# Patient Record
Sex: Female | Born: 1985 | Race: Black or African American | Hispanic: No | Marital: Married | Smoking: Never smoker
Health system: Southern US, Community
[De-identification: ages and names within clinical notes are randomized; demographics above are authoritative.]

## PROBLEM LIST (undated history)

## (undated) DIAGNOSIS — B379 Candidiasis, unspecified: Secondary | ICD-10-CM

## (undated) DIAGNOSIS — E669 Obesity, unspecified: Secondary | ICD-10-CM

## (undated) DIAGNOSIS — N898 Other specified noninflammatory disorders of vagina: Secondary | ICD-10-CM

## (undated) DIAGNOSIS — B9689 Other specified bacterial agents as the cause of diseases classified elsewhere: Secondary | ICD-10-CM

## (undated) DIAGNOSIS — K219 Gastro-esophageal reflux disease without esophagitis: Secondary | ICD-10-CM

## (undated) DIAGNOSIS — N760 Acute vaginitis: Secondary | ICD-10-CM

## (undated) HISTORY — DX: Other specified noninflammatory disorders of vagina: N89.8

---

## 2011-10-10 HISTORY — PX: LEEP: SHX91

## 2014-08-11 ENCOUNTER — Encounter (HOSPITAL_COMMUNITY): Payer: Self-pay | Admitting: Emergency Medicine

## 2014-08-11 ENCOUNTER — Emergency Department (INDEPENDENT_AMBULATORY_CARE_PROVIDER_SITE_OTHER)
Admission: EM | Admit: 2014-08-11 | Discharge: 2014-08-11 | Disposition: A | Discharge status: Home / Self Care | Payer: Medicaid - Out of State | Source: Home / Self Care | Attending: Emergency Medicine | Admitting: Emergency Medicine

## 2014-08-11 ENCOUNTER — Other Ambulatory Visit (HOSPITAL_COMMUNITY)
Admission: RE | Admit: 2014-08-11 | Discharge: 2014-08-11 | Disposition: A | Discharge status: Home / Self Care | Payer: Medicaid - Out of State | Source: Ambulatory Visit | Attending: Emergency Medicine | Admitting: Emergency Medicine

## 2014-08-11 DIAGNOSIS — Z113 Encounter for screening for infections with a predominantly sexual mode of transmission: Secondary | ICD-10-CM | POA: Insufficient documentation

## 2014-08-11 DIAGNOSIS — N390 Urinary tract infection, site not specified: Secondary | ICD-10-CM

## 2014-08-11 DIAGNOSIS — N760 Acute vaginitis: Secondary | ICD-10-CM | POA: Insufficient documentation

## 2014-08-11 LAB — POCT URINALYSIS DIP (DEVICE)
Bilirubin Urine: NEGATIVE
Glucose, UA: NEGATIVE mg/dL
Hgb urine dipstick: NEGATIVE
Ketones, ur: NEGATIVE mg/dL
LEUKOCYTES UA: NEGATIVE
Nitrite: NEGATIVE
PH: 5.5 (ref 5.0–8.0)
PROTEIN: NEGATIVE mg/dL
Specific Gravity, Urine: 1.025 (ref 1.005–1.030)
Urobilinogen, UA: 0.2 mg/dL (ref 0.0–1.0)

## 2014-08-11 LAB — CBC WITH DIFFERENTIAL/PLATELET
BASOS PCT: 0 % (ref 0–1)
Basophils Absolute: 0 10*3/uL (ref 0.0–0.1)
Eosinophils Absolute: 0.1 10*3/uL (ref 0.0–0.7)
Eosinophils Relative: 1 % (ref 0–5)
HCT: 37.2 % (ref 36.0–46.0)
Hemoglobin: 12.5 g/dL (ref 12.0–15.0)
Lymphocytes Relative: 46 % (ref 12–46)
Lymphs Abs: 3.4 10*3/uL (ref 0.7–4.0)
MCH: 29.2 pg (ref 26.0–34.0)
MCHC: 33.6 g/dL (ref 30.0–36.0)
MCV: 86.9 fL (ref 78.0–100.0)
Monocytes Absolute: 0.4 10*3/uL (ref 0.1–1.0)
Monocytes Relative: 6 % (ref 3–12)
NEUTROS PCT: 47 % (ref 43–77)
Neutro Abs: 3.5 10*3/uL (ref 1.7–7.7)
PLATELETS: 239 10*3/uL (ref 150–400)
RBC: 4.28 MIL/uL (ref 3.87–5.11)
RDW: 13.3 % (ref 11.5–15.5)
WBC: 7.4 10*3/uL (ref 4.0–10.5)

## 2014-08-11 LAB — POCT PREGNANCY, URINE: Preg Test, Ur: NEGATIVE

## 2014-08-11 MED ORDER — HYDROCODONE-ACETAMINOPHEN 5-325 MG PO TABS
ORAL_TABLET | ORAL | Status: AC
  Filled 2014-08-11: qty 2

## 2014-08-11 MED ORDER — OXYCODONE-ACETAMINOPHEN 5-325 MG PO TABS: ORAL_TABLET | ORAL | Status: DC

## 2014-08-11 MED ORDER — CEPHALEXIN 500 MG PO CAPS: 500.0000 mg | ORAL_CAPSULE | Freq: Three times a day (TID) | ORAL | Status: DC

## 2014-08-11 MED ORDER — HYDROCODONE-ACETAMINOPHEN 5-325 MG PO TABS
2.0000 | ORAL_TABLET | Freq: Once | ORAL | Status: AC
  Administered 2014-08-11: 2 via ORAL

## 2014-08-11 NOTE — ED Provider Notes (Signed)
Chief Complaint   Abdominal Pain   History of Present Illness   Nancy Young is Nancy Young 28 year old female who has a history since last night of bilateral pelvic pain without radiation which is constant and rated 9/10 in intensity. This is accompanied by urinary frequency, urgency, and dysuria. The pain is also worse with a bowel movement the patient denies any blood in the urine. She's had no fever or chills. She states she hasn't had much of an appetite. No nausea or vomiting. No constipation, diarrhea, or blood in the stool. The pain is worse on the right than the left. The patient's last menstrual period is October 20. She is sexually active but in a same sex relationship. She does not use birth control. She denies any history of ovarian cysts or fibroid tumors.  Review of Systems   Other than as noted above, the patient denies any of the following symptoms: Constitutional:  No fever, chills, weight loss or anorexia. Abdomen:  No nausea, vomiting, hematememesis, melena, diarrhea, or hematochezia. GU:  No dysuria, frequency, urgency, or hematuria. Gyn:  No vaginal discharge, itching, abnormal bleeding, dyspareunia, or pelvic pain.  PMFSH   Past medical history, family history, social history, meds, and allergies were reviewed.   Physical Exam     Vital signs:  BP 117/75 mmHg  Pulse 69  Temp(Src) 98.2 F (36.8 C) (Oral)  Resp 12  SpO2 97%  LMP 08/04/2014 Gen:  Alert, oriented, in no distress. Lungs:  Breath sounds clear and equal bilaterally.  No wheezes, rales or rhonchi. Heart:  Regular rhythm.  No gallops or murmers.   Abdomen:  Soft, flat, and nondistended. No organomegaly or mass. Bowel sounds were normally active. There is tenderness to palpation across the entire lower abdomen without guarding or rebound. Pelvic:  Normal external genitalia, vaginal and cervical mucosa were normal. There was no blood or discharge in the vaginal vault. There was pain on cervical motion.  Uterus was normal in size and shape and moderately tender on palpation. She has moderate bilateral adnexal tenderness without mass.  DNA probes for gonorrhea, Chlamydia, Trichomonas, Gardnerella, and Candida were obtained. Skin:  Clear, warm and dry.  No rash.  Labs   Results for orders placed or performed during the hospital encounter of 08/11/14  CBC with Differential  Result Value Ref Range   WBC 7.4 4.0 - 10.5 K/uL   RBC 4.28 3.87 - 5.11 MIL/uL   Hemoglobin 12.5 12.0 - 15.0 g/dL   HCT 40.937.2 81.136.0 - 91.446.0 %   MCV 86.9 78.0 - 100.0 fL   MCH 29.2 26.0 - 34.0 pg   MCHC 33.6 30.0 - 36.0 g/dL   RDW 78.213.3 95.611.5 - 21.315.5 %   Platelets 239 150 - 400 K/uL   Neutrophils Relative % 47 43 - 77 %   Neutro Abs 3.5 1.7 - 7.7 K/uL   Lymphocytes Relative 46 12 - 46 %   Lymphs Abs 3.4 0.7 - 4.0 K/uL   Monocytes Relative 6 3 - 12 %   Monocytes Absolute 0.4 0.1 - 1.0 K/uL   Eosinophils Relative 1 0 - 5 %   Eosinophils Absolute 0.1 0.0 - 0.7 K/uL   Basophils Relative 0 0 - 1 %   Basophils Absolute 0.0 0.0 - 0.1 K/uL  POCT urinalysis dip (device)  Result Value Ref Range   Glucose, UA NEGATIVE NEGATIVE mg/dL   Bilirubin Urine NEGATIVE NEGATIVE   Ketones, ur NEGATIVE NEGATIVE mg/dL   Specific Gravity, Urine 1.025  1.005 - 1.030   Hgb urine dipstick NEGATIVE NEGATIVE   pH 5.5 5.0 - 8.0   Protein, ur NEGATIVE NEGATIVE mg/dL   Urobilinogen, UA 0.2 0.0 - 1.0 mg/dL   Nitrite NEGATIVE NEGATIVE   Leukocytes, UA NEGATIVE NEGATIVE  Pregnancy, urine POC  Result Value Ref Range   Preg Test, Ur NEGATIVE NEGATIVE   The urine was cultured.  Course in Urgent Care Center   The following medications were given:  Medications  HYDROcodone-acetaminophen (NORCO/VICODIN) 5-325 MG per tablet 2 tablet (2 tablets Oral Given 08/11/14 1752)   Assessment   The encounter diagnosis was UTI (lower urinary tract infection).  Differential diagnosis includes ovarian cyst or ovarian torsion. Ectopic pregnancy or PID is  unlikely since she is in a same sex relationship. With prominent urinary symptoms UTI is still the most likely diagnosis. Diverticulitis and appendicitis are unlikely, given her normal CBC.  Plan     1.  Meds:  The following meds were prescribed:   Discharge Medication List as of 08/11/2014  6:44 PM    START taking these medications   Details  cephALEXin (KEFLEX) 500 MG capsule Take 1 capsule (500 mg total) by mouth 3 (three) times daily., Starting 08/11/2014, Until Discontinued, Normal    oxyCODONE-acetaminophen (PERCOCET) 5-325 MG per tablet 1 to 2 tablets every 6 hours as needed for pain., Print        2.  Patient Education/Counseling:  The patient was given appropriate handouts, self care instructions, and instructed in symptomatic relief.    3.  Follow up:  The patient was told to follow up here if no better in 2 days, or sooner if becoming worse in any way, and given some red flag symptoms such as worsening pain, fever, vomiting, or evidence of GI bleeding which would prompt immediate return.     Reuben Likesavid C Olga Bourbeau, MD 08/11/14 [phone]

## 2014-08-11 NOTE — Discharge Instructions (Signed)

## 2014-08-12 LAB — URINE CULTURE
COLONY COUNT: NO GROWTH
Culture: NO GROWTH
Special Requests: NORMAL

## 2014-08-12 LAB — CERVICOVAGINAL ANCILLARY ONLY
CHLAMYDIA, DNA PROBE: NEGATIVE
NEISSERIA GONORRHEA: NEGATIVE
Wet Prep (BD Affirm): NEGATIVE
Wet Prep (BD Affirm): NEGATIVE
Wet Prep (BD Affirm): POSITIVE — AB

## 2014-08-14 ENCOUNTER — Telehealth (HOSPITAL_COMMUNITY): Payer: Self-pay | Admitting: Emergency Medicine

## 2014-08-14 ENCOUNTER — Telehealth (HOSPITAL_COMMUNITY): Payer: Self-pay | Admitting: *Deleted

## 2014-08-14 MED ORDER — METRONIDAZOLE 500 MG PO TABS: 500.0000 mg | ORAL_TABLET | Freq: Two times a day (BID) | ORAL | Status: DC

## 2014-08-14 NOTE — ED Notes (Addendum)
Dr. Lorenz CoasterKeller e-prescribed Flagyl to pt.'s pharmacy for bacterial vaginosis.  I called pt. and left a message for her she had a Rx. at her pharmacy and to call back on Mon. for her results. [name], Nancy Young 08/14/2014 Pt. called back.  Pt. verified x 2 and given results.  Pt. told she had a Rx. of Flagyl at [facility] on [address]. [facility] [address]  I told her she can get it transferred there.  Pt. instructed to no alcohol while taking this medication.  Pt. asked if she needs to finish the keflex. I told her the Urine culture was neg. If no symptoms she does not need to finish it. 08/17/2014

## 2014-08-14 NOTE — Telephone Encounter (Signed)
-----   Message from Vassie MoselleSuzanne M York, RN sent at 08/14/2014  7:01 PM EST ----- Regarding: lab Gardnerella pos. Did you want to treat this? I thought I sent this yesterday but could not find it. [name], Suzanne M 08/14/2014

## 2014-08-14 NOTE — ED Notes (Signed)
The patient's DNA probe came back positive for Gardnerella. She will need metronidazole 500 mg, #14, 1 twice a day for one week. This will be sent to her pharmacy. We will need to call her and let her know these results.   Reuben Likesavid C Christop Hippert, MD 08/14/14 2039

## 2014-08-18 NOTE — ED Notes (Signed)
Pt     Called  Back        And            Had  Already           Had  Her   questians   Answered

## 2014-08-23 ENCOUNTER — Encounter (HOSPITAL_COMMUNITY): Payer: Self-pay | Admitting: *Deleted

## 2014-08-23 ENCOUNTER — Emergency Department (INDEPENDENT_AMBULATORY_CARE_PROVIDER_SITE_OTHER)
Admission: EM | Admit: 2014-08-23 | Discharge: 2014-08-23 | Disposition: A | Discharge status: Home / Self Care | Payer: Medicaid - Out of State | Source: Home / Self Care | Attending: Emergency Medicine | Admitting: Emergency Medicine

## 2014-08-23 DIAGNOSIS — J069 Acute upper respiratory infection, unspecified: Secondary | ICD-10-CM

## 2014-08-23 DIAGNOSIS — B9789 Other viral agents as the cause of diseases classified elsewhere: Principal | ICD-10-CM

## 2014-08-23 MED ORDER — IPRATROPIUM BROMIDE 0.06 % NA SOLN: 2.0000 | Freq: Four times a day (QID) | NASAL | Status: DC

## 2014-08-23 MED ORDER — CETIRIZINE HCL 10 MG PO TABS: 10.0000 mg | ORAL_TABLET | Freq: Every day | ORAL | Status: DC

## 2014-08-23 NOTE — Discharge Instructions (Signed)
You have a virus that is causing a lot of congestion which is causing your ear pain. Use the Atrovent nasal spray 4 times a day. Take Zyrtec 1 pill daily for the next 2 weeks. Get nasal saline spray at the drug store and use it at least 3 times a day. You can also use over-the-counter Afrin nasal spray. Do not use this more than 3 days. Rest your voice as much as you can. You should start to feel better by Wednesday. If your symptoms worsen or change please come back.

## 2014-08-23 NOTE — ED Provider Notes (Signed)
CSN: [id number]     Arrival date & time 08/23/14  1030 History   First MD Initiated Contact with Patient 08/23/14 1040     Chief Complaint  Patient presents with  . Otalgia   (Consider location/radiation/quality/duration/timing/severity/associated sxs/prior Treatment) HPI  She is a 28 year old woman here for evaluation of ear aches. Her symptoms started about one week ago with nasal congestion, rhinorrhea, sore throat, cough. Starting about 2 days ago, she developed pain in her ears, right worse than left. She also started losing her voice.  She denies any ear drainage. No fevers or chills. No nausea or vomiting. She denies any headaches or sinus pressure.  History reviewed. No pertinent past medical history. History reviewed. No pertinent past surgical history. Family History  Problem Relation Age of Onset  . Diabetes Mother    History  Substance Use Topics  . Smoking status: Never Smoker   . Smokeless tobacco: Not on file  . Alcohol Use: Yes     Comment: occasional   OB History    No data available     Review of Systems  Constitutional: Negative for fever.  HENT: Positive for congestion, ear pain, rhinorrhea and sore throat. Negative for ear discharge and sinus pressure.   Respiratory: Positive for cough. Negative for shortness of breath.   Gastrointestinal: Negative for nausea, vomiting, abdominal pain and diarrhea.  Musculoskeletal: Negative for myalgias.  Neurological: Negative for headaches.    Allergies  Review of patient's allergies indicates no known allergies.  Home Medications   Prior to Admission medications   Medication Sig Start Date End Date Taking? Authorizing Provider  cephALEXin (KEFLEX) 500 MG capsule Take 1 capsule (500 mg total) by mouth 3 (three) times daily. 08/11/14   Reuben Likesavid C Keller, MD  cetirizine (ZYRTEC) 10 MG tablet Take 1 tablet (10 mg total) by mouth daily. 08/23/14   Charm RingsErin J Aminah Zabawa, MD  ipratropium (ATROVENT) 0.06 % nasal spray Place 2 sprays  into both nostrils 4 (four) times daily. 08/23/14   Charm RingsErin J Gabriell Daigneault, MD  metroNIDAZOLE (FLAGYL) 500 MG tablet Take 1 tablet (500 mg total) by mouth 2 (two) times daily. 08/14/14   Reuben Likesavid C Keller, MD  oxyCODONE-acetaminophen (PERCOCET) 5-325 MG per tablet 1 to 2 tablets every 6 hours as needed for pain. 08/11/14   Reuben Likesavid C Keller, MD   BP 118/86 mmHg  Pulse 73  Temp(Src) 98.6 F (37 C) (Oral)  Resp 17  SpO2 99%  LMP 08/04/2014 Physical Exam  Constitutional: She is oriented to person, place, and time. She appears well-developed and well-nourished. No distress.  HENT:  Head: Normocephalic and atraumatic.  Right Ear: External ear normal. Tympanic membrane is retracted.  Left Ear: External ear normal. Tympanic membrane is retracted.  Nose: Mucosal edema and rhinorrhea present. Right sinus exhibits no maxillary sinus tenderness and no frontal sinus tenderness. Left sinus exhibits no maxillary sinus tenderness and no frontal sinus tenderness.  Mouth/Throat: Oropharynx is clear and moist. No oropharyngeal exudate.  Neck: Neck supple.  Cardiovascular: Normal rate, regular rhythm and normal heart sounds.   No murmur heard. Pulmonary/Chest: Effort normal and breath sounds normal. No respiratory distress. She has no wheezes. She has no rales.  Lymphadenopathy:    She has no cervical adenopathy.  Neurological: She is alert and oriented to person, place, and time.    ED Course  Procedures (including critical care time) Labs Review Labs Reviewed - No data to display  Imaging Review No results found.   MDM  1. Viral URI with cough    Viral URI with congestion leading to ear pain. We'll treat symptomatically with Atrovent nasal spray and Zyrtec. Also recommended nasal saline spray and Afrin. Follow-up as needed.    Charm RingsErin J Niyana Chesbro, MD 08/23/14 [phone]

## 2014-08-23 NOTE — ED Notes (Signed)
C/o bil. earache R> L.  onset Monday.  C/o congestion in upper airways and losing her voice.

## 2014-11-24 ENCOUNTER — Encounter (HOSPITAL_COMMUNITY): Payer: Self-pay

## 2014-11-24 ENCOUNTER — Emergency Department (HOSPITAL_COMMUNITY)
Admission: EM | Admit: 2014-11-24 | Discharge: 2014-11-24 | Disposition: A | Discharge status: Home / Self Care | Payer: Medicaid - Out of State | Attending: Emergency Medicine | Admitting: Emergency Medicine

## 2014-11-24 DIAGNOSIS — Z792 Long term (current) use of antibiotics: Secondary | ICD-10-CM | POA: Insufficient documentation

## 2014-11-24 DIAGNOSIS — Z79899 Other long term (current) drug therapy: Secondary | ICD-10-CM | POA: Insufficient documentation

## 2014-11-24 DIAGNOSIS — R519 Headache, unspecified: Secondary | ICD-10-CM

## 2014-11-24 DIAGNOSIS — Y92410 Unspecified street and highway as the place of occurrence of the external cause: Secondary | ICD-10-CM | POA: Insufficient documentation

## 2014-11-24 DIAGNOSIS — S8011XA Contusion of right lower leg, initial encounter: Secondary | ICD-10-CM | POA: Insufficient documentation

## 2014-11-24 DIAGNOSIS — R51 Headache: Secondary | ICD-10-CM

## 2014-11-24 DIAGNOSIS — S0990XA Unspecified injury of head, initial encounter: Secondary | ICD-10-CM | POA: Insufficient documentation

## 2014-11-24 DIAGNOSIS — Y998 Other external cause status: Secondary | ICD-10-CM | POA: Insufficient documentation

## 2014-11-24 DIAGNOSIS — Y9389 Activity, other specified: Secondary | ICD-10-CM | POA: Insufficient documentation

## 2014-11-24 MED ORDER — METOCLOPRAMIDE HCL 5 MG/ML IJ SOLN: 10.0000 mg | Freq: Once | INTRAMUSCULAR | Status: DC

## 2014-11-24 MED ORDER — SODIUM CHLORIDE 0.9 % IV BOLUS (SEPSIS): 1000.0000 mL | Freq: Once | INTRAVENOUS | Status: DC

## 2014-11-24 MED ORDER — METOCLOPRAMIDE HCL 10 MG PO TABS
10.0000 mg | ORAL_TABLET | Freq: Once | ORAL | Status: AC
  Administered 2014-11-24: 10 mg via ORAL
  Filled 2014-11-24: qty 1

## 2014-11-24 MED ORDER — KETOROLAC TROMETHAMINE 60 MG/2ML IM SOLN
60.0000 mg | Freq: Once | INTRAMUSCULAR | Status: AC
  Administered 2014-11-24: 60 mg via INTRAMUSCULAR
  Filled 2014-11-24: qty 2
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
## 2014-11-24 NOTE — Discharge Instructions (Signed)
Return to the emergency room with worsening of symptoms, new symptoms or with symptoms that are concerning , especially severe worsening of headache, visual or speech changes, weakness in face, arms or legs. RICE: Rest, Ice (three cycles of 20 mins on, off at least twice a day), compression/brace, elevation. Heating pad works well for back pain. Ibuprofen  (2 tablets ) every 5-6 hours for 3-5 days. Follow up with PCP if symptoms worsen or are persistent. Read below information and follow recommendations. Concussion A concussion, or closed-head injury, is a brain injury caused by a direct blow to the head or by a quick and sudden movement (jolt) of the head or neck. Concussions are usually not life-threatening. Even so, the effects of a concussion can be serious. If you have had a concussion before, you are more likely to experience concussion-like symptoms after a direct blow to the head.  CAUSES  Direct blow to the head, such as from running into another player during a soccer game, being hit in a fight, or hitting your head on a hard surface.  A jolt of the head or neck that causes the brain to move back and forth inside the skull, such as in a car crash. SIGNS AND SYMPTOMS The signs of a concussion can be hard to notice. Early on, they may be missed by you, family members, and health care providers. You may look fine but act or feel differently. Symptoms are usually temporary, but they may last for days, weeks, or even longer. Some symptoms may appear right away while others may not show up for hours or days. Every head injury is different. Symptoms include:  Mild to moderate headaches that will not go away.  A feeling of pressure inside your head.  Having more trouble than usual:  Learning or remembering things you have heard.  Answering questions.  Paying attention or concentrating.  Organizing daily tasks.  Making decisions and solving problems.  Slowness in  thinking, acting or reacting, speaking, or reading.  Getting lost or being easily confused.  Feeling tired all the time or lacking energy (fatigued).  Feeling drowsy.  Sleep disturbances.  Sleeping more than usual.  Sleeping less than usual.  Trouble falling asleep.  Trouble sleeping (insomnia).  Loss of balance or feeling lightheaded or dizzy.  Nausea or vomiting.  Numbness or tingling.  Increased sensitivity to:  Sounds.  Lights.  Distractions.  Vision problems or eyes that tire easily.  Diminished sense of taste or smell.  Ringing in the ears.  Mood changes such as feeling sad or anxious.  Becoming easily irritated or angry for little or no reason.  Lack of motivation.  Seeing or hearing things other people do not see or hear (hallucinations). DIAGNOSIS Your health care provider can usually diagnose a concussion based on a description of your injury and symptoms. He or she will ask whether you passed out (lost consciousness) and whether you are having trouble remembering events that happened right before and during your injury. Your evaluation might include:  A brain scan to look for signs of injury to the brain. Even if the test shows no injury, you may still have a concussion.  Blood tests to be sure other problems are not present. TREATMENT  Concussions are usually treated in an emergency department, in urgent care, or at a clinic. You may need to stay in the hospital overnight for further treatment.  Tell your health care provider if you are taking any medicines, including prescription  medicines, over-the-counter medicines, and natural remedies. Some medicines, such as blood thinners (anticoagulants) and aspirin, may increase the chance of complications. Also tell your health care provider whether you have had alcohol or are taking illegal drugs. This information may affect treatment.  Your health care provider will send you home with important  instructions to follow.  How fast you will recover from a concussion depends on many factors. These factors include how severe your concussion is, what part of your brain was injured, your age, and how healthy you were before the concussion.  Most people with mild injuries recover fully. Recovery can take time. In general, recovery is slower in older persons. Also, persons who have had a concussion in the past or have other medical problems may find that it takes longer to recover from their current injury. HOME CARE INSTRUCTIONS General Instructions  Carefully follow the directions your health care provider gave you.  Only take over-the-counter or prescription medicines for pain, discomfort, or fever as directed by your health care provider.  Take only those medicines that your health care provider has approved.  Do not drink alcohol until your health care provider says you are well enough to do so. Alcohol and certain other drugs may slow your recovery and can put you at risk of further injury.  If it is harder than usual to remember things, write them down.  If you are easily distracted, try to do one thing at a time. For example, do not try to watch TV while fixing dinner.  Talk with family members or close friends when making important decisions.  Keep all follow-up appointments. Repeated evaluation of your symptoms is recommended for your recovery.  Watch your symptoms and tell others to do the same. Complications sometimes occur after a concussion. Older adults with a brain injury may have a higher risk of serious complications, such as a blood clot on the brain.  Tell your teachers, school nurse, school counselor, coach, athletic trainer, or work Production designer, theatre/television/film about your injury, symptoms, and restrictions. Tell them about what you can or cannot do. They should watch for:  Increased problems with attention or concentration.  Increased difficulty remembering or learning new  information.  Increased time needed to complete tasks or assignments.  Increased irritability or decreased ability to cope with stress.  Increased symptoms.  Rest. Rest helps the brain to heal. Make sure you:  Get plenty of sleep at night. Avoid staying up late at night.  Keep the same bedtime hours on weekends and weekdays.  Rest during the day. Take daytime naps or rest breaks when you feel tired.  Limit activities that require a lot of thought or concentration. These include:  Doing homework or job-related work.  Watching TV.  Working on the computer.  Avoid any situation where there is potential for another head injury (football, hockey, soccer, basketball, martial arts, downhill snow sports and horseback riding). Your condition will get worse every time you experience a concussion. You should avoid these activities until you are evaluated by the appropriate follow-up health care providers. Returning To Your Regular Activities You will need to return to your normal activities slowly, not all at once. You must give your body and brain enough time for recovery.  Do not return to sports or other athletic activities until your health care provider tells you it is safe to do so.  Ask your health care provider when you can drive, ride a bicycle, or operate heavy machinery. Your ability to  react may be slower after a brain injury. Never do these activities if you are dizzy.  Ask your health care provider about when you can return to work or school. Preventing Another Concussion It is very important to avoid another brain injury, especially before you have recovered. In rare cases, another injury can lead to permanent brain damage, brain swelling, or death. The risk of this is greatest during the first 7-10 days after a head injury. Avoid injuries by:  Wearing a seat belt when riding in a car.  Drinking alcohol only in moderation.  Wearing a helmet when biking, skiing,  skateboarding, skating, or doing similar activities.  Avoiding activities that could lead to a second concussion, such as contact or recreational sports, until your health care provider says it is okay.  Taking safety measures in your home.  Remove clutter and tripping hazards from floors and stairways.  Use grab bars in bathrooms and handrails by stairs.  Place non-slip mats on floors and in bathtubs.  Improve lighting in dim areas. SEEK MEDICAL CARE IF:  You have increased problems paying attention or concentrating.  You have increased difficulty remembering or learning new information.  You need more time to complete tasks or assignments than before.  You have increased irritability or decreased ability to cope with stress.  You have more symptoms than before. Seek medical care if you have any of the following symptoms for more than 2 weeks after your injury:  Lasting (chronic) headaches.  Dizziness or balance problems.  Nausea.  Vision problems.  Increased sensitivity to noise or light.  Depression or mood swings.  Anxiety or irritability.  Memory problems.  Difficulty concentrating or paying attention.  Sleep problems.  Feeling tired all the time. SEEK IMMEDIATE MEDICAL CARE IF:  You have severe or worsening headaches. These may be a sign of a blood clot in the brain.  You have weakness (even if only in one hand, leg, or part of the face).  You have numbness.  You have decreased coordination.  You vomit repeatedly.  You have increased sleepiness.  One pupil is larger than the other.  You have convulsions.  You have slurred speech.  You have increased confusion. This may be a sign of a blood clot in the brain.  You have increased restlessness, agitation, or irritability.  You are unable to recognize people or places.  You have neck pain.  It is difficult to wake you up.  You have unusual behavior changes.  You lose  consciousness. MAKE SURE YOU:  Understand these instructions.  Will watch your condition.  Will get help right away if you are not doing well or get worse. Document Released: 12/16/2003 Document Revised: 09/30/2013 Document Reviewed: 04/17/2013 [facility] Patient Information 2015 [employer], [place]. This information is not intended to replace advice given to you by your health care provider. Make sure you discuss any questions you have with your health care provider.

## 2015-01-01 ENCOUNTER — Emergency Department (HOSPITAL_COMMUNITY): Payer: Medicaid Other

## 2015-01-01 ENCOUNTER — Encounter (HOSPITAL_COMMUNITY): Payer: Self-pay | Admitting: Emergency Medicine

## 2015-01-01 ENCOUNTER — Emergency Department (HOSPITAL_COMMUNITY)
Admission: EM | Admit: 2015-01-01 | Discharge: 2015-01-01 | Disposition: A | Discharge status: Home / Self Care | Payer: Medicaid Other | Attending: Emergency Medicine | Admitting: Emergency Medicine

## 2015-01-01 DIAGNOSIS — Y929 Unspecified place or not applicable: Secondary | ICD-10-CM | POA: Diagnosis not present

## 2015-01-01 DIAGNOSIS — T1490XA Injury, unspecified, initial encounter: Secondary | ICD-10-CM

## 2015-01-01 DIAGNOSIS — Y9302 Activity, running: Secondary | ICD-10-CM | POA: Diagnosis not present

## 2015-01-01 DIAGNOSIS — X58XXXA Exposure to other specified factors, initial encounter: Secondary | ICD-10-CM | POA: Diagnosis not present

## 2015-01-01 DIAGNOSIS — S93401A Sprain of unspecified ligament of right ankle, initial encounter: Secondary | ICD-10-CM | POA: Diagnosis not present

## 2015-01-01 DIAGNOSIS — S99911A Unspecified injury of right ankle, initial encounter: Secondary | ICD-10-CM | POA: Diagnosis present

## 2015-01-01 DIAGNOSIS — M25571 Pain in right ankle and joints of right foot: Secondary | ICD-10-CM

## 2015-01-01 DIAGNOSIS — Y998 Other external cause status: Secondary | ICD-10-CM | POA: Diagnosis not present

## 2015-01-01 MED ORDER — ACETAMINOPHEN 325 MG PO TABS: 325.0000 mg | ORAL_TABLET | Freq: Four times a day (QID) | ORAL | Status: DC | PRN

## 2015-01-01 NOTE — ED Notes (Signed)
Pt presents with right ankle pain after falling into a ditch while running prior to arrival.  No obvious deformity noted, pedal pulse strong.

## 2015-01-01 NOTE — Discharge Instructions (Signed)
Please call your doctor for a followup appointment within 24-48 hours. When you talk to your doctor please let them know that you were seen in the emergency department and have them acquire all of your records so that they can discuss the findings with you and formulate a treatment plan to fully care for your new and ongoing problems. Please follow-up with health and wellness Center Please follow-up with orthopedics Please keep ankle in brace at all times-when resting can remove and apply ice Please rest, ice, elevate-toes above nose at least 4-5 times per day Please avoid any physical or strenuous activity Please take medications as prescribed-no more than 2500 mg of Tylenol per day for this can lead to Tylenol overdose and liver issues Please use crutches and weightbearing Please continue to monitor symptoms closely and if symptoms are to worsen or change (fever greater than 101, chills, sweating, nausea, vomiting, chest pain, shortness of breathe, difficulty breathing, weakness, numbness, tingling, worsening or changes to pain pattern, fall, injury, changes to skin colored, coldness to the touch the foot, changes to red/blue/white/black to the toes, loss of sensation) please report back to the Emergency Department immediately.    Ankle Sprain An ankle sprain is an injury to the strong, fibrous tissues (ligaments) that hold the bones of your ankle joint together.  CAUSES An ankle sprain is usually caused by a fall or by twisting your ankle. Ankle sprains most commonly occur when you step on the outer edge of your foot, and your ankle turns inward. People who participate in sports are more prone to these types of injuries.  SYMPTOMS   Pain in your ankle. The pain may be present at rest or only when you are trying to stand or walk.  Swelling.  Bruising. Bruising may develop immediately or within 1 to 2 days after your injury.  Difficulty standing or walking, particularly when turning corners  or changing directions. DIAGNOSIS  Your caregiver will ask you details about your injury and perform a physical exam of your ankle to determine if you have an ankle sprain. During the physical exam, your caregiver will press on and apply pressure to specific areas of your foot and ankle. Your caregiver will try to move your ankle in certain ways. An X-ray exam may be done to be sure a bone was not broken or a ligament did not separate from one of the bones in your ankle (avulsion fracture).  TREATMENT  Certain types of braces can help stabilize your ankle. Your caregiver can make a recommendation for this. Your caregiver may recommend the use of medicine for pain. If your sprain is severe, your caregiver may refer you to a surgeon who helps to restore function to parts of your skeletal system (orthopedist) or a physical therapist. HOME CARE INSTRUCTIONS   Apply ice to your injury for 1-2 days or as directed by your caregiver. Applying ice helps to reduce inflammation and pain.  Put ice in a plastic bag.  Place a towel between your skin and the bag.  Leave the ice on for 15-20 minutes at a time, every 2 hours while you are awake.  Only take over-the-counter or prescription medicines for pain, discomfort, or fever as directed by your caregiver.  Elevate your injured ankle above the level of your heart as much as possible for 2-3 days.  If your caregiver recommends crutches, use them as instructed. Gradually put weight on the affected ankle. Continue to use crutches or a cane until you can  walk without feeling pain in your ankle.  If you have a plaster splint, wear the splint as directed by your caregiver. Do not rest it on anything harder than a pillow for the first 24 hours. Do not put weight on it. Do not get it wet. You may take it off to take a shower or bath.  You may have been given an elastic bandage to wear around your ankle to provide support. If the elastic bandage is too tight (you  have numbness or tingling in your foot or your foot becomes cold and blue), adjust the bandage to make it comfortable.  If you have an air splint, you may blow more air into it or let air out to make it more comfortable. You may take your splint off at night and before taking a shower or bath. Wiggle your toes in the splint several times per day to decrease swelling. SEEK MEDICAL CARE IF:   You have rapidly increasing bruising or swelling.  Your toes feel extremely cold or you lose feeling in your foot.  Your pain is not relieved with medicine. SEEK IMMEDIATE MEDICAL CARE IF:  Your toes are numb or blue.  You have severe pain that is increasing. MAKE SURE YOU:   Understand these instructions.  Will watch your condition.  Will get help right away if you are not doing well or get worse. Document Released: 09/25/2005 Document Revised: 06/19/2012 Document Reviewed: 10/07/2011 [facility] Patient Information 2015 [place], [place]. This information is not intended to replace advice given to you by your health care provider. Make sure you discuss any questions you have with your health care provider.   Emergency Department Resource Guide 1) Find a Doctor and Pay Out of Pocket Although you won't have to find out who is covered by your insurance plan, it is a good idea to ask around and get recommendations. You will then need to call the office and see if the doctor you have chosen will accept you as a new patient and what types of options they offer for patients who are self-pay. Some doctors offer discounts or will set up payment plans for their patients who do not have insurance, but you will need to ask so you aren't surprised when you get to your appointment.  2) Contact Your Local Health Department Not all health departments have doctors that can see patients for sick visits, but many do, so it is worth a call to see if yours does. If you don't know where your local health department is, you  can check in your phone book. The CDC also has a tool to help you locate your state's health department, and many state websites also have listings of all of their local health departments.  3) Find a Walk-in Clinic If your illness is not likely to be very severe or complicated, you may want to try a walk in clinic. These are popping up all over the country in pharmacies, drugstores, and shopping centers. They're usually staffed by nurse practitioners or physician assistants that have been trained to treat common illnesses and complaints. They're usually fairly quick and inexpensive. However, if you have serious medical issues or chronic medical problems, these are probably not your best option.  No Primary Care Doctor: - Call Health Connect at  [phone] - they can help you locate a primary care doctor that  accepts your insurance, provides certain services, etc. - Physician Referral Service- [phone]  Chronic Pain Problems: Organization  Address  Phone   Notes  [facility]  [phone] Patients need to be referred by their primary care doctor.   Medication Assistance: Organization         Address  Phone   Notes  [facility] Medication [facility] [address] [place] [place], [place] [address] [phone] --Must be a resident of [facility] -- Must have NO insurance coverage whatsoever (no Medicaid/ Medicare, etc.) -- The pt. MUST have a primary care doctor that directs their care regularly and follows them in the community   [facility]ist  [phone]   [employer]  [phone]    Agencies that provide inexpensive medical care: Organization         Address  Phone   Notes  [facility]  [phone]   [facility]    [phone]   [facility] [address] [place], [place] [address] [phone]   [facility] [address] [place] [place] [phone]   Planned Parenthood    [phone]   [facility]    [phone]   Community Health and [facility]  [address], [place] Phone:  [phone], Fax:  [phone] Hours of Operation:  9 am - 6 pm, M-F.  Also accepts Medicaid/Medicare and self-pay.  [facility]  [address], [place] Phone: [phone], Fax: [phone]. Hours of Operation:  8:30 am - 5:30 pm, M-F.  Also accepts Medicaid and self-pay.  [facility] [place] [address] [place] Phone: [phone]   [facility] [address] Natasha Bence [place], [place] [phone], Ext. [phone] Mondays & Thursdays: 7-9 AM.  First 15 patients are seen on a first come, first serve basis.    Medicaid-accepting [facility] Providers:  Organization         Address  Phone   Notes  [facility] [address], [place] [phone] Also accepts self-pay patients.  [facility] [address] Laurell Josephs [place], [place]  [phone]   [facility] [address], [place] [phone]   [facility] [address] [place] [phone]   Renaye Rakers [address], [place]   [phone] Only accepts [place] Access [place] patients after they have their name applied to their card.   Self-Pay (no insurance) in [facility]:  Organization         Address  Phone   Notes  Sickle Cell Patients, [facility] [address] [place], [place] [phone]   [facility] [address] [place], [place] [phone]   [facility] [place]  [address], [place] [phone]   [facility]/Dr. Osei-Bonsu  [address], [place] or [address], [place] [phone] Phone number for both [place] and [place] locations  is the same.  [facility] [address], [place] [phone]   [facility] [address] [place] or [address] [phone]   [facility] [address], [place] [phone], phone; [phone], fax Sees patients 1st and 3rd Saturday of every month.  Must not qualify  for public or private insurance (i.e. Medicaid, Medicare, [place] Health Choice, Veterans' Benefits)  Household income should be no more than 200% of the poverty level The clinic cannot treat you if you are pregnant or think you are pregnant  Sexually transmitted diseases are not treated at the clinic.    Dental Care: Organization         Address  Phone  Notes  [facility] [address], [place] [phone] Accepts children up to age 29 who are enrolled in [place] or [place] Health Choice; pregnant women with a Medicaid card; and children who have applied for Medicaid or [place] Health Choice, but were declined, whose parents can pay a reduced fee at time of service.  [facility] [place]  [address], [place] [phone] Accepts children up to age 29 who are enrolled in [place] or [place] Health Choice; pregnant women with a Medicaid card; and children who have applied for Medicaid or [place] Health Choice, but were declined, whose parents can pay a reduced fee at time of service.  [facility]  [address], [place] [phone] Patients are seen by appointment only. Walk-ins are not accepted. [facility] will see patients 29 years of age and older. Monday - Tuesday (8am-5pm) Most Wednesdays (8:30-5pm) $30 per visit, cash only  [facility]  [address], [facility] [place] [phone] Patients are seen by appointment only. Walk-ins are not accepted. [facility] will see  patients 29 years of age and older. One Wednesday Evening (Monthly: Volunteer Based).  $30 per visit, cash only  [employer] [facility]  [phone] for adults; Children under age 384, call Graduate Pediatric Dentistry at [phone]. Children aged 364-14, please call [phone] to request a pediatric application.  Dental services are provided in all areas of dental care including fillings, crowns and bridges, complete and partial dentures, implants, gum treatment, root canals, and extractions. Preventive care is also provided. Treatment is provided to both adults and children. Patients are selected via a lottery and there is often a waiting list.   [facility] [address], [place]  [phone] [email or web address]   [facility] [address], [place], [place] [phone], Ext. [phone] Second and Fourth Thursday of each month, opens at 6:30 AM; Clinic ends at 9 AM.  Patients are seen on a first-come first-served basis, and a limited number are seen during each clinic.   [facility]  [address], [place], [place] [phone]   Eligibility Requirements You must have lived in [place], [place], or [place] counties for at least the last three months.   You cannot be eligible for state or federal sponsored [place]healthcare insurance, including CIGNAVeterans Administration, [place]icaid, or [employer]icare.   You generally cannot be eligible for healthcare insurance through your employer.    How to apply: Eligibility screenings are held every Tuesday and Wednesday afternoon from 1:00 pm until 4:00 pm. You do not need an appointment for the interview!  [facility] [address], [place], [place] [phone]   [facility]  [phone]   [facility]  [phone]   [facility]  [phone]    Behavioral Health Resources in the Community: Intensive Outpatient  Programs Organization         Address  Phone  Notes  [facility] [address] [place], [place] [phone]   [facility] Outpatient [address] [place], [place]   ADS: Alcohol & Drug Svcs [address] [place], [place]   [place] [address]  [place], [place] or [phone]   Substance Abuse Resources Organization         Address  Phone  Notes  Alcohol and Drug Services  [phone]   [place]  [phone]   [facility] [place]   Chinita Pester  [phone]   Residential & Outpatient Substance Abuse Program  3527654334   Psychological Services Organization         Address  Phone  Notes  [facility] [place]  [place]  [phone]   [place] [address] [place] or [phone]    Mobile Crisis Teams Organization         Address  Phone  Notes  [facility], Mobile Crisis Care Unit  [phone]   [facility]  [address] [place], [place]   [place] Levels [address] [place] [phone]    Self-Help/Support Groups Organization         Address  Phone             Notes  [place]. of [place] - variety of support groups  [place] Call for more information  Narcotics Anonymous (NA), [facility] [address], [employer] [place]  2 meetings at this location   Special educational needs teacher         Address  Phone  Notes  [facility]idential [facility] [place],    [place]  [phone]   [facility]  [address] [place] [address], [place], [place]   Edina Scarbro, [place] [phone] Admissions: 8am-3pm M-F  Incentives Substance [place] [address],    [place], [place]  [phone]   [facility] [address] [place], [place], [place]   [facility] [address]  [address] [place], [place]   [facility] - Intensive Outpatient [address] Kristeen Mans [address], [place], [place]   [facility] [place].,  [place], [place] [phone] or [phone]   [facility] (RTS) [address] [place], [place] Accepts Medicaid  [facility] [place] [address]  [place] [phone] Substance Abuse/Addiction Treatment   [facility] Organization         Address  Phone  Notes  [facility]  [phone]   Domenic Schwab, PhD [address] Arlis Porta [place], [place]   [phone] or [phone]   [place]   [address] [place], [place] [phone]   [facility] [address] [place], [place] [phone] Insurance/Medicaid/sponsorship through [facility] and Families [address] [place] [address] [place]                                    [place], [place] [phone] [place] [address], [place] [phone]    Dr. Adele Schilder  [phone]   [facility] 1) [address], [place] °2) [address], [place] °3)  [address], [place] [phone] °([phone] ° °([phone]   °[place] Child Abuse Hotline ([phone]) [phone] or [phone] (After Hours)    ° ° ° ° °

## 2015-01-01 NOTE — ED Provider Notes (Signed)
CSN: [id number]     Arrival date & time 01/01/15  0007 History   First MD Initiated Contact with Patient 01/01/15 0037     Chief Complaint  Patient presents with  . Ankle Injury     (Consider location/radiation/quality/duration/timing/severity/associated sxs/prior Treatment) The history is provided by the patient. No language interpreter was used.  Nancy Young is a 29 y/o F with no known significant PMHx presenting to the D with right ankle pain that occurred this evening when the patient was on her way to work. Reported that she was running and did not see a ditch. Reported that her right ankle twisted in the ditch resulting in sudden onset of pain. Patient reported that the pain is a constant sharp shooting pain localized to the right ankle with radiation to the top of the right foot. Reported that bearing weight makes the pain worse. Reported that she has been elevating the ankle, but stated that she has not iced or taken anything for pain. 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 Stated that she has had some intermittent tingling to her right toes. Reported that she had an injury to her right ankle a long time ago while she was in high school. Denied loss of sensation, knee pain, hip pain, back pain, neck pain, head injury, headache, dizziness. LMP 12/18/2014. PCP none  History reviewed. No pertinent past medical history. History reviewed. No pertinent past surgical history. Family History  Problem Relation Age of Onset  . Diabetes Mother    History  Substance Use Topics  . Smoking status: Never Smoker   . Smokeless tobacco: Not on file  . Alcohol Use: Yes     Comment: occasional   OB History    No data available     Review of Systems  Musculoskeletal: Positive for arthralgias (right ankle ).  Neurological: Negative for weakness and numbness.      Allergies  Review of patient's allergies indicates no known  allergies.  Home Medications   Prior to Admission medications   Medication Sig Start Date End Date Taking? Authorizing Provider  acetaminophen (TYLENOL) 325 MG tablet Take 1 tablet (325 mg total) by mouth every 6 (six) hours as needed for moderate pain. 01/01/15   Kandra Graven, PA-C  cephALEXin (KEFLEX) 500 MG capsule Take 1 capsule (500 mg total) by mouth 3 (three) times daily. Patient not taking: Reported on 11/24/2014 08/11/14   Reuben Likesavid C Keller, MD  cetirizine (ZYRTEC) 10 MG tablet Take 1 tablet (10 mg total) by mouth daily. Patient not taking: Reported on 11/24/2014 08/23/14   Charm RingsErin J Honig, MD  ibuprofen (ADVIL,MOTRIN) 200 MG tablet Take 400 mg by mouth every 6 (six) hours as needed for moderate pain.    Historical Provider, MD  ipratropium (ATROVENT) 0.06 % nasal spray Place 2 sprays into both nostrils 4 (four) times daily. Patient not taking: Reported on 11/24/2014 08/23/14   Charm RingsErin J Honig, MD  metroNIDAZOLE (FLAGYL) 500 MG tablet Take 1 tablet (500 mg total) by mouth 2 (two) times daily. Patient not taking: Reported on 11/24/2014 08/14/14   Reuben Likesavid C Keller, MD  oxyCODONE-acetaminophen (PERCOCET) 5-325 MG per tablet 1 to 2 tablets every 6 hours as needed for pain. Patient not taking: Reported on 11/24/2014 08/11/14   Reuben Likesavid C Keller, MD   BP 140/83 mmHg  Pulse 99  Temp(Src) 98.4 F (36.9 C) (Oral)  Resp 16  Ht 5\' 3"  (1.6  m)  Wt 200 lb (90.719 kg)  BMI 35.44 kg/m2  SpO2 100%  LMP 12/18/2014 Physical Exam  Constitutional: She is oriented to person, place, and time. She appears well-developed and well-nourished. No distress.  HENT:  Head: Normocephalic and atraumatic.  Eyes: Conjunctivae and EOM are normal.  Neck: Normal range of motion. Neck supple.  Cardiovascular: Normal rate, regular rhythm and normal heart sounds.  Exam reveals no friction rub.   No murmur heard. Pulses:      Radial pulses are 2+ on the right side, and 2+ on the left side.       Dorsalis pedis pulses are 2+ on  the right side, and 2+ on the left side.  Cap refill < 3 seconds  Pulmonary/Chest: Effort normal and breath sounds normal. No respiratory distress. She has no wheezes. She has no rales.  Musculoskeletal: She exhibits tenderness.       Right ankle: She exhibits decreased range of motion (secondary to pain - pain with inversion and eversion of the foot). She exhibits no swelling, no ecchymosis, no deformity and no laceration. Tenderness. Lateral malleolus and AITFL tenderness found. Achilles tendon exhibits normal Thompson's test results.  Neurological: She is alert and oriented to person, place, and time. No cranial nerve deficit. She exhibits normal muscle tone. Coordination normal.  Strength 5+/5+ to lower extremities bilaterally with resistance applied, equal distribution noted Sensation intact with differentiation to sharp and dull touch   Skin: Skin is warm and dry. No rash noted. She is not diaphoretic. No erythema.  Psychiatric: She has a normal mood and affect. Her behavior is normal. Thought content normal.  Nursing note and vitals reviewed.   ED Course  Procedures (including critical care time) Labs Review Labs Reviewed - No data to display  Imaging Review Dg Ankle Complete Right  01/01/2015   CLINICAL DATA:  Larey Seat into ditch, with injury to right ankle. Right medial malleolar and tarsal pain. Initial encounter.  EXAM: RIGHT ANKLE - COMPLETE 3+ VIEW  COMPARISON:  None.  FINDINGS: There is no evidence of fracture or dislocation. The ankle mortise is intact; the interosseous space is within normal limits. No talar tilt or subluxation is seen.  The joint spaces are preserved. No significant soft tissue abnormalities are seen.  IMPRESSION: No evidence of fracture or dislocation.   Electronically Signed   By: Roanna Raider M.D.   On: 01/01/2015 01:02   Dg Foot Complete Right  01/01/2015   CLINICAL DATA:  [place] Seat into ditch, with injury to right ankle. Right medial malleolar and tarsal pain.  Initial encounter.  EXAM: RIGHT FOOT COMPLETE - 3+ VIEW  COMPARISON:  Right ankle radiographs performed earlier today at 12:28 a.m.  FINDINGS: There is no evidence of fracture or dislocation. The joint spaces are preserved. There is no evidence of talar subluxation; the subtalar joint is unremarkable in appearance. An os naviculare is noted.  Mild soft tissue swelling is noted along the dorsum of the forefoot.  IMPRESSION: 1. No evidence of fracture or dislocation. 2. Os naviculare noted.   Electronically Signed   By: Roanna Raider M.D.   On: 01/01/2015 01:18     EKG Interpretation None      MDM   Final diagnoses:  Ankle sprain, right, initial encounter  Right ankle pain    Medications - No data to display  Filed Vitals:   01/01/15 0009  BP: 140/83  Pulse: 99  Temp: 98.4 F (36.9 C)  TempSrc: Oral  Resp: 16  Height:  (1.6 m)  Weight: 200 lb (90.719 kg)  SpO2: 100%   Plain film of right foot negative evidence of fracture dislocation. Plain film of right ankle no evidence of fracture dislocation. Pulses palpable and strong. Negative focal neurological deficits. Sensation intact. Strength intact. Suspicion to be ankle sprain, most likely affecting the anterior talofibular ligament secondary to pain upon palpation to the lateral aspect of the right ankle. Negative signs of compartment syndrome. Negative signs of ischemia. Patient stable, afebrile. Patient not septic appearing. Discharged patient. [facility]. Patient placed in ankle ASO brace and crutches administered. Discussed with patient to rest, ice, elevate. Discussed with patient to closely monitor symptoms and if symptoms are to worsen or change to report back to the ED - strict return instructions given.  Patient agreed to plan of care, understood, all questions answered.   [employer], PA-C 01/01/15 1610  Pricilla Loveless, MD 01/04/15 [phone]

## 2015-01-01 NOTE — ED Notes (Signed)
Patient transported to X-ray 

## 2015-01-01 NOTE — ED Notes (Signed)
Pt back from xr

## 2015-01-27 ENCOUNTER — Emergency Department (HOSPITAL_COMMUNITY)
Admission: EM | Admit: 2015-01-27 | Discharge: 2015-01-27 | Discharge status: Absent without leave | Payer: Medicaid - Out of State | Source: Home / Self Care

## 2015-01-28 ENCOUNTER — Encounter (HOSPITAL_COMMUNITY): Payer: Self-pay | Admitting: Emergency Medicine

## 2015-01-28 ENCOUNTER — Emergency Department (HOSPITAL_COMMUNITY)
Admission: EM | Admit: 2015-01-28 | Discharge: 2015-01-28 | Disposition: A | Discharge status: Home / Self Care | Payer: Worker's Compensation | Attending: Emergency Medicine | Admitting: Emergency Medicine

## 2015-01-28 DIAGNOSIS — X58XXXA Exposure to other specified factors, initial encounter: Secondary | ICD-10-CM | POA: Insufficient documentation

## 2015-01-28 DIAGNOSIS — S93401S Sprain of unspecified ligament of right ankle, sequela: Secondary | ICD-10-CM | POA: Insufficient documentation

## 2015-01-28 DIAGNOSIS — Y929 Unspecified place or not applicable: Secondary | ICD-10-CM | POA: Insufficient documentation

## 2015-01-28 DIAGNOSIS — Y939 Activity, unspecified: Secondary | ICD-10-CM | POA: Insufficient documentation

## 2015-01-28 DIAGNOSIS — Y999 Unspecified external cause status: Secondary | ICD-10-CM | POA: Insufficient documentation

## 2015-01-28 MED ORDER — IBUPROFEN 800 MG PO TABS: 800.0000 mg | ORAL_TABLET | Freq: Three times a day (TID) | ORAL | Status: DC | PRN

## 2015-01-28 NOTE — ED Notes (Signed)
Pt here for re-check of right ankle injury from March.

## 2015-01-28 NOTE — Discharge Instructions (Signed)
Acute Ankle Sprain with Phase II Rehab An acute ankle sprain is a partial or complete tear in one or more of the ligaments of the ankle due to traumatic injury. The severity of the injury depends on both the number of ligaments sprained and the grade of sprain. There are 3 grades of sprains.  A grade 1 sprain is a mild sprain. There is a slight pull without obvious tearing. There is no loss of strength, and the muscle and ligament are the correct length.  A grade 2 sprain is a moderate sprain. There is tearing of fibers within the substance of the ligament where it connects two bones or two cartilages. The length of the ligament is increased, and there is usually decreased strength.  A grade 3 sprain is a complete rupture of the ligament and is uncommon. In addition to the grade of sprain, there are 3 types of ankle sprains.  Lateral ankle sprains. This is a sprain of one or more of the 3 ligaments on the outer side (lateral) of the ankle. These are the most common sprains. Medial ankle sprains. There is one large triangular ligament on the inner side (medial) of the ankle that is susceptible to injury. Medial ankle sprains are less common. Syndesmosis, "high ankle," sprains. The syndesmosis is the ligament that connects the two bones of the lower leg. Syndesmosis sprains usually only occur with very severe ankle sprains. SYMPTOMS  Pain, tenderness, and swelling in the ankle, starting at the side of injury that may progress to the whole ankle and foot with time.  "Pop" or tearing sensation at the time of injury.  Bruising that may spread to the heel.  Impaired ability to walk soon after injury. CAUSES   Acute ankle sprains are caused by trauma placed on the ankle that temporarily forces or pries the anklebone (talus) out of its normal socket.  Stretching or tearing of the ligaments that normally hold the joint in place (usually due to a twisting injury). RISK INCREASES WITH:  Previous  ankle sprain.  Sports in which the foot may land awkwardly (basketball, volleyball, soccer) or walking or running on uneven or rough surfaces.  Shoes with inadequate support to prevent sideways motion when stress occurs.  Poor strength and flexibility.  Poor balance skills.  Contact sports. PREVENTION  Warm up and stretch properly before activity.  Maintain physical fitness:  Ankle and leg flexibility, muscle strength, and endurance.  Cardiovascular fitness.  Balance training activities.  Use proper technique and have a coach correct improper technique.  Taping, protective strapping, bracing, or high-top tennis shoes may help prevent injury. Initially, tape is best. However, it loses most of its support function within 10 to 15 minutes.  Wear proper fitted protective shoes. Combining high-top shoes with taping or bracing is more effective than using either alone.  Provide the ankle with support during sports and practice activities for 12 months following injury. PROGNOSIS   If treated properly, ankle sprains can be expected to recover completely. However, the length of recovery depends on the degree of injury.  A grade 1 sprain usually heals enough in 5 to 7 days to allow modified activity and requires an average of 6 weeks to heal completely.  A grade 2 sprain requires 6 to 10 weeks to heal completely.  A grade 3 sprain requires 12 to 16 weeks to heal.  A syndesmosis sprain often takes more than 3 months to heal. RELATED COMPLICATIONS   Frequent recurrence of symptoms may result   in a chronic problem. Appropriately addressing the problem the first time decreases the frequency of recurrence and optimizes healing time. Severity of initial sprain does not predict the likelihood of later instability.  Injury to other structures (bone, cartilage, or tendon).  Chronically unstable or arthritic ankle joint are possible with repeated sprains. TREATMENT Treatment initially  involves the use of ice, medicine, and compression bandages to help reduce pain and inflammation. Ankle sprains are usually immobilized in a walking cast or boot to allow for healing. Crutches may be recommended to reduce pressure on the injury. After immobilization, strengthening and stretching exercises may be necessary to regain strength and a full range of motion. Surgery is rarely needed to treat ankle sprains. MEDICATION   Nonsteroidal anti-inflammatory medicines, such as aspirin and ibuprofen (do not take for the first 3 days after injury or within 7 days before surgery), or other minor pain relievers, such as acetaminophen, are often recommended. Take these as directed by your caregiver. Contact your caregiver immediately if any bleeding, stomach upset, or signs of an allergic reaction occur from these medicines.  Ointments applied to the skin may be helpful.  Pain relievers may be prescribed as necessary by your caregiver. Do not take prescription pain medicine for longer than 4 to 7 days. Use only as directed and only as much as you need. HEAT AND COLD  Cold treatment (icing) is used to relieve pain and reduce inflammation for acute and chronic cases. Cold should be applied for 10 to 15 minutes every 2 to 3 hours for inflammation and pain and immediately after any activity that aggravates your symptoms. Use ice packs or an ice massage.  Heat treatment may be used before performing stretching and strengthening activities prescribed by your caregiver. Use a heat pack or a warm soak. SEEK IMMEDIATE MEDICAL CARE IF:   Pain, swelling, or bruising worsens despite treatment.  You experience pain, numbness, discoloration, or coldness in the foot or toes.  New, unexplained symptoms develop. (Drugs used in treatment may produce side effects.) EXERCISES  PHASE II EXERCISES RANGE OF MOTION (ROM) AND STRETCHING EXERCISES - Ankle Sprain, Acute-Phase II, Weeks 3 to 4 After your physician, physical  therapist, or athletic trainer feels your knee has made progress significant enough to begin more advanced exercises, he or she may recommend completing some of the following exercises. Although each person heals at different rates, most people will be ready for these exercises between 3 and 4 weeks after their injury. Do not begin these exercises until you have your caregiver's permission. He or she may also advise you to continue with the exercises which you completed in Phase I of your rehabilitation. While completing these exercises, remember:   Restoring tissue flexibility helps normal motion to return to the joints. This allows healthier, less painful movement and activity.  An effective stretch should be held for at least 30 seconds.  A stretch should never be painful. You should only feel a gentle lengthening or release in the stretched tissue. RANGE OF MOTION - Ankle Plantar Flexion   Sit with your right / left leg crossed over your opposite knee.  Use your opposite hand to pull the top of your foot and toes toward you.  You should feel a gentle stretch on the top of your foot/ankle. Hold this position for __________. Repeat __________ times. Complete __________ times per day.  RANGE OF MOTION - Ankle Eversion  Sit with your right / left ankle crossed over your opposite knee.    Grip your foot with your opposite hand, placing your thumb on the top of your foot and your fingers across the bottom of your foot.  Gently push your foot downward with a slight rotation so your littlest toes rise slightly  You should feel a gentle stretch on the inside of your ankle. Hold the stretch for __________ seconds. Repeat __________ times. Complete this exercise __________ times per day.  RANGE OF MOTION - Ankle Inversion  Sit with your right / left ankle crossed over your opposite knee.  Grip your foot with your opposite hand, placing your thumb on the bottom of your foot and your fingers across  the top of your foot.  Gently pull your foot so the smallest toe comes toward you and your thumb pushes the inside of the ball of your foot away from you.  You should feel a gentle stretch on the outside of your ankle. Hold the stretch for __________ seconds. Repeat __________ times. Complete this exercise __________ times per day.  STRETCH - Gastrocsoleus  Sit with your right / left leg extended. Holding onto both ends of a belt or towel, loop it around the ball of your foot.  Keeping your right / left ankle and foot relaxed and your knee straight, pull your foot and ankle toward you using the belt/towel.  You should feel a gentle stretch behind your calf or knee. Hold this position for __________ seconds. Repeat __________ times. Complete this stretch __________ times per day.  RANGE OF MOTION - Ankle Dorsiflexion, Active Assisted  Remove shoes and sit on a chair that is preferably not on a carpeted surface.  Place right / left foot under knee. Extend your opposite leg for support.  Keeping your heel down, slide your right / left foot back toward the chair until you feel a stretch at your ankle or calf. If you do not feel a stretch, slide your bottom forward to the edge of the chair while still keeping your heel down.  Hold this stretch for __________ seconds. Repeat __________ times. Complete this stretch __________ times per day.  STRETCH - Gastroc, Standing   Place hands on wall.  Extend right / left leg and place a folded washcloth under the arch of your foot for support. Keep the front knee somewhat bent.  Slightly point your toes inward on your back foot.  Keeping your right / left heel on the floor and your knee straight, shift your weight toward the wall, not allowing your back to arch.  You should feel a gentle stretch in the calf. Hold this position for __________ seconds. Repeat __________ times. Complete this stretch __________ times per day. STRETCH - Soleus,  Standing  Place hands on wall.  Extend right / left leg and place a folded washcloth under the arch of your foot for support. Keep the front knee somewhat bent.  Slightly point your toes inward on your back foot.  Keep your right / left heel on the floor, bend your back knee, and slightly shift your weight over the back leg so that you feel a gentle stretch deep in your back calf.  Hold this position for __________ seconds. Repeat __________ times. Complete this stretch __________ times per day. STRETCH - Gastrocsoleus, Standing Note: This exercise can place a lot of stress on your foot and ankle. Please complete this exercise only if specifically instructed by your caregiver.   Place the ball of your right / left foot on a step, keeping your other   foot firmly on the same step.  Hold on to the wall or a rail for balance.  Slowly lift your other foot, allowing your body weight to press your heel down over the edge of the step.  You should feel a stretch in your right / left calf.  Hold this position for __________ seconds.  Repeat this exercise with a slight bend in your knee. Repeat __________ times. Complete this stretch __________ times per day.  STRENGTHENING EXERCISES - Ankle Sprain, Acute-Phase II Around 3 to 4 weeks after your injury, you may progress to some of these exercises in your rehabilitation program. Do not begin these until you have your caregiver's permission. Although your condition has improved, the Phase I exercises will continue to be helpful and you may continue to complete them. As you complete strengthening exercises, remember:   Strong muscles with good endurance tolerate stress better.  Do the exercises as initially prescribed by your caregiver. Progress slowly with each exercise, gradually increasing the number of repetitions and weight used under his or her guidance.  You may experience muscle soreness or fatigue, but the pain or discomfort you are trying  to eliminate should never worsen during these exercises. If this pain does worsen, stop and make certain you are following the directions exactly. If the pain is still present after adjustments, discontinue the exercise until you can discuss the trouble with your caregiver. STRENGTH - Plantar-flexors, Standing  Stand with your feet shoulder width apart. Steady yourself with a wall or table using as little support as needed.  Keeping your weight evenly spread over the width of your feet, rise up on your toes.*  Hold this position for __________ seconds. Repeat __________ times. Complete this exercise __________ times per day.  *If this is too easy, shift your weight toward your right / left leg until you feel challenged. Ultimately, you may be asked to do this exercise with your right / left foot only. STRENGTH - Dorsiflexors and Plantar-flexors, Heel/toe Walking  Dorsiflexion: Walk on your heels only. Keep your toes as high as possible.  Walk for ____________________ seconds/feet.  Repeat __________ times. Complete __________ times per day.  Plantar flexion: Walk on your toes only. Keep your heels as high as possible.  Walk for ____________________ seconds/feet. Repeat __________ times. Complete __________ times per day.  BALANCE - Tandem Walking  Place your uninjured foot on a line 2 to 4 inches wide and at least 10 feet long.  Keeping your balance without using anything for extra support, place your right / left heel directly in front of your other foot.  Slowly raise your back foot up, lifting from the heel to the toes, and place it directly in front of the right / left foot.  Continue to walk along the line slowly. Walk for ____________________ feet. Repeat ____________________ times. Complete ____________________ times per day. BALANCE - Inversion/Eversion Use caution, these are advanced level exercises. Do not begin them until you are advised to do so.   Create a balance  board using a sturdy board about 1  feet long and at 1 to 1  feet wide and a 1  inch diameter rod or pipe that is as long as the board's width. A copper pipe or a solid broomstick work well.  Stand on a non-carpeted surface near a countertop or wall. Step onto the board so that your feet are hip-width apart and equally straddle the rod/pipe.  Keeping your feet in place, complete these two exercises   without shifting your upper body or hips:  Tip the board from side-to-side. Control the movement so the board does not forcefully strike the ground. The board should silently tap the ground.  Tip the board side-to-side without striking the ground. Occasionally pause and maintain a steady position at various points.  Repeat the first two exercises, but use only your right / left foot. Place your right / left foot directly over the rod/pipe. Repeat __________ times. Complete this exercise __________ times a day. BALANCE - Plantar/Dorsi Flexion Use caution, these are advanced level exercises. Do not begin them until you are advised to do so.   Create a balance board using a sturdy board about 1  feet long and at 1 to 1  feet wide and a 1  inch diameter rod or pipe that is as long as the board's width. A copper pipe or a solid broomstick work well.  Stand on a non-carpeted surface near a countertop or wall. Stand on the board so that the rod/pipe runs under the arches in your feet.  Keeping your feet in place, complete these two exercises without shifting your upper body or hips:  Tip the board from side-to-side. Control the movement so the board does not forcefully strike the ground. The board should silently tap the ground.  Tip the board side-to-side without striking the ground. Occasionally pause and maintain a steady position at various points.  Repeat the first two exercises, but use only your right / left foot. Stand in the center of the board. Repeat __________ times. Complete this  exercise __________ times a day. STRENGTH - Plantar-flexors, Eccentric Note: This exercise can place a lot of stress on your foot and ankle. Please complete this exercise only if specifically instructed by your caregiver.   Place the balls of your feet on a step. With your hands, use only enough support from a wall or rail to keep your balance.  Keep your knees straight and rise up on your toes.  Slowly shift your weight entirely to your toes and pick up your opposite foot. Gently and with controlled movement, lower your weight through your right / left foot so that your heel drops below the level of the step. You will feel a slight stretch in the back of your calf at the ending position.  Use the healthy leg to help rise up onto the balls of both feet, then lower weight only on the right / left leg again. Build up to 15 repetitions. Then progress to 3 consecutive sets of 15 repetitions.*  After completing the above exercise, complete the same exercise with a slight knee bend (about 30 degrees). Again, build up to 15 repetitions. Then progress to 3 consecutive sets of 15 repetitions.* Perform this exercise __________ times per day.  *When you easily complete 3 sets of 15, your physician, physical therapist, or athletic trainer may advise you to add resistance by wearing a backpack filled with additional weight. Document Released: 01/15/2006 Document Revised: 12/18/2011 Document Reviewed: 01/07/2009 [employer] Patient Information 2015 [employer]. This information is not intended to replace advice given to you by your health care provider. Make sure you discuss any questions you have with your health care provider.  

## 2015-01-28 NOTE — ED Provider Notes (Signed)
Medical screening examination/treatment/procedure(s) were performed by non-physician practitioner and as supervising physician I was immediately available for consultation/collaboration.   EKG Interpretation None        Earlie Arciga, MD 01/28/15 2058 

## 2015-01-28 NOTE — ED Provider Notes (Signed)
CSN: [id number]     Arrival date & time 01/28/15  7829 History  This chart was scribed for non-physician practitioner, Fayrene Helper, PA-C, working with Purvis Sheffield, MD by Charline Bills, ED Scribe. This patient was seen in room TR08C/TR08C and the patient's care was started at 9:46 AM.   No chief complaint on file.  The history is provided by the patient. No language interpreter was used.   HPI Comments: Nancy Young is a 29 y.o. female who presents to the Emergency Department complaining of persistent R ankle pain for the past 3 weeks. Pt sprained her ankle on 01/01/15 and has pain and associated swelling since. Pain is exacerbated with movement and bearing weight. She denies R knee or R hip pain. Pt has tried an ankle brace and RICE without relief. No other alleviating or aggravating factors.  No past medical history on file. No past surgical history on file. Family History  Problem Relation Age of Onset  . Diabetes Mother    History  Substance Use Topics  . Smoking status: Never Smoker   . Smokeless tobacco: Not on file  . Alcohol Use: Yes     Comment: occasional   OB History    No data available     Review of Systems  Musculoskeletal: Positive for joint swelling and arthralgias.   Allergies  Review of patient's allergies indicates no known allergies.  Home Medications   Prior to Admission medications   Medication Sig Start Date End Date Taking? Authorizing Provider  acetaminophen (TYLENOL) 325 MG tablet Take 1 tablet (325 mg total) by mouth every 6 (six) hours as needed for moderate pain. 01/01/15   Marissa Sciacca, PA-C  cephALEXin (KEFLEX) 500 MG capsule Take 1 capsule (500 mg total) by mouth 3 (three) times daily. Patient not taking: Reported on 11/24/2014 08/11/14   Reuben Likes, MD  cetirizine (ZYRTEC) 10 MG tablet Take 1 tablet (10 mg total) by mouth daily. Patient not taking: Reported on 11/24/2014 08/23/14   Charm Rings, MD  ibuprofen (ADVIL,MOTRIN) 200  MG tablet Take 400 mg by mouth every 6 (six) hours as needed for moderate pain.    Historical Provider, MD  ipratropium (ATROVENT) 0.06 % nasal spray Place 2 sprays into both nostrils 4 (four) times daily. Patient not taking: Reported on 11/24/2014 08/23/14   Charm Rings, MD  metroNIDAZOLE (FLAGYL) 500 MG tablet Take 1 tablet (500 mg total) by mouth 2 (two) times daily. Patient not taking: Reported on 11/24/2014 08/14/14   Reuben Likes, MD  oxyCODONE-acetaminophen (PERCOCET) 5-325 MG per tablet 1 to 2 tablets every 6 hours as needed for pain. Patient not taking: Reported on 11/24/2014 08/11/14   Reuben Likes, MD   BP 107/73 mmHg  Pulse 71  Temp(Src) 98.5 F (36.9 C) (Oral)  Resp 16  SpO2 100%  LMP 01/18/2015 Physical Exam  Constitutional: She is oriented to person, place, and time. She appears well-developed and well-nourished. No distress.  HENT:  Head: Normocephalic and atraumatic.  Eyes: Conjunctivae and EOM are normal.  Neck: Neck supple. No tracheal deviation present.  Cardiovascular: Normal rate.   Pulmonary/Chest: Effort normal. No respiratory distress.  Musculoskeletal: Normal range of motion.  R ankle: tenderness to laterl malleolus region with mild edema. Normal dorsiflexion and plantar flexion. Pain with inversion. R foot without tenderness to 5th metatarsal. Pt is able to ambulate without difficulty.   Neurological: She is alert and oriented to person, place, and time.  Skin: Skin is  warm and dry.  Psychiatric: She has a normal mood and affect. Her behavior is normal.  Nursing note and vitals reviewed.  ED Course  Procedures (including critical care time) DIAGNOSTIC STUDIES: Oxygen Saturation is 100% on RA, normal by my interpretation.    COORDINATION OF CARE: 9:50 AM-Discussed treatment plan which includes continue RICE and wear ankle brace with pt at bedside and pt agreed to plan.   Labs Review Labs Reviewed - No data to display  Imaging Review No results  found.   EKG Interpretation None      MDM   Final diagnoses:  Right ankle sprain, sequela    BP 107/73 mmHg  Pulse 71  Temp(Src) 98.5 F (36.9 C) (Oral)  Resp 16  SpO2 100%  LMP 01/18/2015   I personally performed the services described in this documentation, which was scribed in my presence. The recorded information has been reviewed and is accurate.     Fayrene HelperBowie Lauriana Denes, PA-C 02/02/15 1606  Purvis SheffieldForrest Harrison, MD 02/14/15 1352

## 2015-02-01 ENCOUNTER — Encounter (HOSPITAL_BASED_OUTPATIENT_CLINIC_OR_DEPARTMENT_OTHER): Payer: Self-pay | Admitting: Emergency Medicine

## 2015-02-01 ENCOUNTER — Telehealth (HOSPITAL_BASED_OUTPATIENT_CLINIC_OR_DEPARTMENT_OTHER): Payer: Self-pay | Admitting: Emergency Medicine

## 2015-02-24 ENCOUNTER — Emergency Department (HOSPITAL_COMMUNITY): Payer: Medicaid Other

## 2015-02-24 ENCOUNTER — Encounter (HOSPITAL_COMMUNITY): Payer: Self-pay | Admitting: Emergency Medicine

## 2015-02-24 ENCOUNTER — Emergency Department (HOSPITAL_COMMUNITY)
Admission: EM | Admit: 2015-02-24 | Discharge: 2015-02-24 | Disposition: A | Discharge status: Home / Self Care | Payer: Medicaid Other | Attending: Emergency Medicine | Admitting: Emergency Medicine

## 2015-02-24 DIAGNOSIS — R002 Palpitations: Secondary | ICD-10-CM

## 2015-02-24 DIAGNOSIS — R079 Chest pain, unspecified: Secondary | ICD-10-CM | POA: Insufficient documentation

## 2015-02-24 DIAGNOSIS — R000 Tachycardia, unspecified: Secondary | ICD-10-CM | POA: Diagnosis not present

## 2015-02-24 LAB — BASIC METABOLIC PANEL
Anion gap: 10 (ref 5–15)
BUN: 8 mg/dL (ref 6–20)
CO2: 25 mmol/L (ref 22–32)
Calcium: 9.2 mg/dL (ref 8.9–10.3)
Chloride: 104 mmol/L (ref 101–111)
Creatinine, Ser: 0.78 mg/dL (ref 0.44–1.00)
GFR calc Af Amer: >60 mL/min (ref >60)
GLUCOSE: 111 mg/dL — AB (ref 65–99)
Potassium: 3.3 mmol/L — ABNORMAL LOW (ref 3.5–5.1)
SODIUM: 139 mmol/L (ref 135–145)

## 2015-02-24 LAB — CBC WITH DIFFERENTIAL/PLATELET
BASOS ABS: 0 10*3/uL (ref 0.0–0.1)
Basophils Relative: 0 % (ref 0–1)
EOS ABS: 0 10*3/uL (ref 0.0–0.7)
Eosinophils Relative: 0 % (ref 0–5)
HCT: 36.5 % (ref 36.0–46.0)
Hemoglobin: 11.8 g/dL — ABNORMAL LOW (ref 12.0–15.0)
Lymphocytes Relative: 39 % (ref 12–46)
Lymphs Abs: 2.7 10*3/uL (ref 0.7–4.0)
MCH: 28.2 pg (ref 26.0–34.0)
MCHC: 32.3 g/dL (ref 30.0–36.0)
MCV: 87.3 fL (ref 78.0–100.0)
Monocytes Absolute: 0.6 10*3/uL (ref 0.1–1.0)
Monocytes Relative: 8 % (ref 3–12)
NEUTROS PCT: 53 % (ref 43–77)
Neutro Abs: 3.6 10*3/uL (ref 1.7–7.7)
PLATELETS: 304 10*3/uL (ref 150–400)
RBC: 4.18 MIL/uL (ref 3.87–5.11)
RDW: 14 % (ref 11.5–15.5)
WBC: 6.9 10*3/uL (ref 4.0–10.5)

## 2015-02-24 LAB — I-STAT BETA HCG BLOOD, ED (MC, WL, AP ONLY)

## 2015-02-24 LAB — I-STAT TROPONIN, ED: Troponin i, poc: 0 ng/mL (ref 0.00–0.08)

## 2015-02-24 LAB — D-DIMER, QUANTITATIVE (NOT AT ARMC)

## 2015-02-24 MED ORDER — MORPHINE SULFATE 4 MG/ML IJ SOLN
4.0000 mg | Freq: Once | INTRAMUSCULAR | Status: AC
  Administered 2015-02-24: 4 mg via INTRAVENOUS
  Filled 2015-02-24: qty 1

## 2015-02-24 NOTE — ED Notes (Signed)
Rob, PA at bedside. Pt placed on monitor on arrival to rm A5.

## 2015-02-24 NOTE — ED Notes (Addendum)
To ED via [facility] with c/o chest pain midsternal nonradiating, started at 0530-woke up from sleep. States pain is 8/10 at present. Received NTG x1 enroute per EMS without change in pain. Attends "[facility]" for therapy/counseling-- was picked up there by EMS

## 2015-02-24 NOTE — ED Provider Notes (Signed)
CSN: [id number]     Arrival date & time 02/24/15  1321 History   First MD Initiated Contact with Patient 02/24/15 1322     Chief Complaint  Patient presents with  . Chest Pain  . Tachycardia     (Consider location/radiation/quality/duration/timing/severity/associated sxs/prior Treatment) HPI Comments: Patient presents emergency department with chief complaint of chest pain. She states pain awakened her from sleep at about 5 AM. She reports feeling associated palpitations. States that her heart was racing in her chest. She denies any associated shortness breath. Denies any radiating symptoms. Denies having felt anything like this before. She received nitroglycerin by EMS with no relief. There are no aggravating or alleviating factors. She denies any history of PE, DVT, or CAD. She has had relatives that have had heart disease and PE. She denies any recent travel, surgery, immobilization, or exogenous estrogen use.  The history is provided by the patient. No language interpreter was used.    History reviewed. No pertinent past medical history. History reviewed. No pertinent past surgical history. Family History  Problem Relation Age of Onset  . Diabetes Mother    History  Substance Use Topics  . Smoking status: Never Smoker   . Smokeless tobacco: Not on file  . Alcohol Use: Yes     Comment: occasional   OB History    No data available     Review of Systems  Constitutional: Negative for fever and chills.  Respiratory: Negative for shortness of breath.   Cardiovascular: Positive for chest pain.  Gastrointestinal: Negative for nausea, vomiting, diarrhea and constipation.  Genitourinary: Negative for dysuria.  All other systems reviewed and are negative.     Allergies  Review of patient's allergies indicates no known allergies.  Home Medications   Prior to Admission medications   Medication Sig Start Date End Date Taking? Authorizing Provider  acetaminophen (TYLENOL) 325  MG tablet Take 1 tablet (325 mg total) by mouth every 6 (six) hours as needed for moderate pain. 01/01/15   Marissa Sciacca, PA-C  cephALEXin (KEFLEX) 500 MG capsule Take 1 capsule (500 mg total) by mouth 3 (three) times daily. Patient not taking: Reported on 11/24/2014 08/11/14   Reuben Likesavid C Keller, MD  cetirizine (ZYRTEC) 10 MG tablet Take 1 tablet (10 mg total) by mouth daily. Patient not taking: Reported on 11/24/2014 08/23/14   Charm RingsErin J Honig, MD  ibuprofen (ADVIL,MOTRIN) 800 MG tablet Take 1 tablet (800 mg total) by mouth every 8 (eight) hours as needed for moderate pain. 01/28/15   Fayrene HelperBowie Tran, PA-C  ipratropium (ATROVENT) 0.06 % nasal spray Place 2 sprays into both nostrils 4 (four) times daily. Patient not taking: Reported on 11/24/2014 08/23/14   Charm RingsErin J Honig, MD  metroNIDAZOLE (FLAGYL) 500 MG tablet Take 1 tablet (500 mg total) by mouth 2 (two) times daily. Patient not taking: Reported on 11/24/2014 08/14/14   Reuben Likesavid C Keller, MD  oxyCODONE-acetaminophen (PERCOCET) 5-325 MG per tablet 1 to 2 tablets every 6 hours as needed for pain. Patient not taking: Reported on 11/24/2014 08/11/14   Reuben Likesavid C Keller, MD   BP 132/71 mmHg  Pulse 102  Temp(Src) 98.3 F (36.8 C) (Oral)  Resp 16  SpO2 97%  LMP 02/24/2015 (Exact Date) Physical Exam  Constitutional: She is oriented to person, place, and time. She appears well-developed and well-nourished.  HENT:  Head: Normocephalic and atraumatic.  Eyes: Conjunctivae and EOM are normal. Pupils are equal, round, and reactive to light.  Neck: Normal range of motion. Neck  supple.  Cardiovascular: Regular rhythm.  Exam reveals no gallop and no friction rub.   No murmur heard. Mildly tachycardic  Pulmonary/Chest: Effort normal and breath sounds normal. No respiratory distress. She has no wheezes. She has no rales. She exhibits no tenderness.  Abdominal: Soft. Bowel sounds are normal. She exhibits no distension and no mass. There is no tenderness. There is no rebound and  no guarding.  Musculoskeletal: Normal range of motion. She exhibits no edema or tenderness.  Neurological: She is alert and oriented to person, place, and time.  Skin: Skin is warm and dry.  Psychiatric: She has a normal mood and affect. Her behavior is normal. Judgment and thought content normal.  Nursing note and vitals reviewed.   ED Course  Procedures (including critical care time) Results for orders placed or performed during the hospital encounter of 02/24/15  D-dimer, quantitative  Result Value Ref Range   D-Dimer, Quant <0.27 0.00 - 0.48 ug/mL-FEU  CBC with Differential/Platelet  Result Value Ref Range   WBC 6.9 4.0 - 10.5 K/uL   RBC 4.18 3.87 - 5.11 MIL/uL   Hemoglobin 11.8 (L) 12.0 - 15.0 g/dL   HCT 04.5 40.9 - 81.1 %   MCV 87.3 78.0 - 100.0 fL   MCH 28.2 26.0 - 34.0 pg   MCHC 32.3 30.0 - 36.0 g/dL   RDW 91.4 78.2 - 95.6 %   Platelets 304 150 - 400 K/uL   Neutrophils Relative % 53 43 - 77 %   Neutro Abs 3.6 1.7 - 7.7 K/uL   Lymphocytes Relative 39 12 - 46 %   Lymphs Abs 2.7 0.7 - 4.0 K/uL   Monocytes Relative 8 3 - 12 %   Monocytes Absolute 0.6 0.1 - 1.0 K/uL   Eosinophils Relative 0 0 - 5 %   Eosinophils Absolute 0.0 0.0 - 0.7 K/uL   Basophils Relative 0 0 - 1 %   Basophils Absolute 0.0 0.0 - 0.1 K/uL  Basic metabolic panel  Result Value Ref Range   Sodium 139 135 - 145 mmol/L   Potassium 3.3 (L) 3.5 - 5.1 mmol/L   Chloride 104 101 - 111 mmol/L   CO2 25 22 - 32 mmol/L   Glucose, Bld 111 (H) 65 - 99 mg/dL   BUN 8 6 - 20 mg/dL   Creatinine, Ser 2.13 0.44 - 1.00 mg/dL   Calcium 9.2 8.9 - 08.6 mg/dL   GFR calc non Af Amer >60 >60 mL/min   GFR calc Af Amer >60 >60 mL/min   Anion gap 10 5 - 15  I-stat troponin, ED  Result Value Ref Range   Troponin i, poc 0.00 0.00 - 0.08 ng/mL   Comment 3          I-Stat Beta hCG blood, ED ([facility], [facility], [facility] only)  Result Value Ref Range   I-stat hCG, quantitative <5.0 <5 mIU/mL   Comment 3           Dg Chest 2  View  02/24/2015   CLINICAL DATA:  Acute chest pain.  EXAM: CHEST  2 VIEW  COMPARISON:  None.  FINDINGS: The heart size and mediastinal contours are within normal limits. Both lungs are clear. No pneumothorax or pleural effusion is noted. The visualized skeletal structures are unremarkable.  IMPRESSION: No active cardiopulmonary disease.   Electronically Signed   By: Lupita Raider, M.D.   On: 02/24/2015 14:27     Imaging Review Dg Chest 2 View  02/24/2015  CLINICAL DATA:  Acute chest pain.  EXAM: CHEST  2 VIEW  COMPARISON:  None.  FINDINGS: The heart size and mediastinal contours are within normal limits. Both lungs are clear. No pneumothorax or pleural effusion is noted. The visualized skeletal structures are unremarkable.  IMPRESSION: No active cardiopulmonary disease.   Electronically Signed   By: Lupita RaiderJames  Green Jr, M.D.   On: 02/24/2015 14:27     EKG Interpretation   Date/Time:  Wednesday Feb 24 2015 13:25:02 EDT Ventricular Rate:  100 PR Interval:  134 QRS Duration: 82 QT Interval:  351 QTC Calculation: 453 R Axis:   54 Text Interpretation:  Fast sinus arrhythmia Nonspecific T abnormalities,  anterior leads Baseline wander in lead(s) I II III aVR aVL aVF V2 No old  tracing to compare Confirmed by [facility] AlexandriaWOFFORD  MD, TREY ([id number]) on 02/24/2015  3:07:14 PM      MDM   Final diagnoses:  Chest pain  Palpitations    Patient with palpitations that started this morning. She never experienced this before. Will check labs, EKG, chest x-ray.  Patient feels improved. No chest pain. No shortness breath. D-dimer and troponin are negative. EKG shows some T-wave abnormalities, but no evidence of ischemia. Will recommend cardiology follow-up. Discussed the results with patient, who understands and agrees with plan. She is stable and ready for discharge.    Roxy HorsemanRobert Oluwafemi Villella, PA-C 02/24/15 1556  Blake DivineJohn Wofford, MD 02/25/15 [phone]

## 2015-02-24 NOTE — Discharge Instructions (Signed)
Your EKG or heart rhythm strip showed non-specific t-wave changes.  You will need to see the cardiologist and have a repeat EKG and possibly a heart monitor.  Please call them for an appointment.  The rest of your ED workup is normal.  Palpitations A palpitation is the feeling that your heartbeat is irregular or is faster than normal. It may feel like your heart is fluttering or skipping a beat. Palpitations are usually not a serious problem. However, in some cases, you may need further medical evaluation. CAUSES  Palpitations can be caused by:  Smoking.  Caffeine or other stimulants, such as diet pills or energy drinks.  Alcohol.  Stress and anxiety.  Strenuous physical activity.  Fatigue.  Certain medicines.  Heart disease, especially if you have a history of irregular heart rhythms (arrhythmias), such as atrial fibrillation, atrial flutter, or supraventricular tachycardia.  An improperly working pacemaker or defibrillator. DIAGNOSIS  To find the cause of your palpitations, your health care provider will take your medical history and perform a physical exam. Your health care provider may also have you take a test called an ambulatory electrocardiogram (ECG). An ECG records your heartbeat patterns over a 24-hour period. You may also have other tests, such as:  Transthoracic echocardiogram (TTE). During echocardiography, sound waves are used to evaluate how blood flows through your heart.  Transesophageal echocardiogram (TEE).  Cardiac monitoring. This allows your health care provider to monitor your heart rate and rhythm in real time.  Holter monitor. This is a portable device that records your heartbeat and can help diagnose heart arrhythmias. It allows your health care provider to track your heart activity for several days, if needed.  Stress tests by exercise or by giving medicine that makes the heart beat faster. TREATMENT  Treatment of palpitations depends on the cause of  your symptoms and can vary greatly. Most cases of palpitations do not require any treatment other than time, relaxation, and monitoring your symptoms. Other causes, such as atrial fibrillation, atrial flutter, or supraventricular tachycardia, usually require further treatment. HOME CARE INSTRUCTIONS   Avoid:  Caffeinated coffee, tea, soft drinks, diet pills, and energy drinks.  Chocolate.  Alcohol.  Stop smoking if you smoke.  Reduce your stress and anxiety. Things that can help you relax include:  A method of controlling things in your body, such as your heartbeats, with your mind (biofeedback).  Yoga.  Meditation.  Physical activity such as swimming, jogging, or walking.  Get plenty of rest and sleep. SEEK MEDICAL CARE IF:   You continue to have a fast or irregular heartbeat beyond 24 hours.  Your palpitations occur more often. SEEK IMMEDIATE MEDICAL CARE IF:  You have chest pain or shortness of breath.  You have a severe headache.  You feel dizzy or you faint. MAKE SURE YOU:  Understand these instructions.  Will watch your condition.  Will get help right away if you are not doing well or get worse. Document Released: 09/22/2000 Document Revised: 09/30/2013 Document Reviewed: 11/24/2011 [facility] Patient Information 2015 [place], [place]. This information is not intended to replace advice given to you by your health care provider. Make sure you discuss any questions you have with your health care provider.  Chest Pain (Nonspecific) It is often hard to give a specific diagnosis for the cause of chest pain. There is always a chance that your pain could be related to something serious, such as a heart attack or a blood clot in the lungs. You need to follow  up with your health care provider for further evaluation. CAUSES   Heartburn.  Pneumonia or bronchitis.  Anxiety or stress.  Inflammation around your heart (pericarditis) or lung (pleuritis or pleurisy).  A  blood clot in the lung.  A collapsed lung (pneumothorax). It can develop suddenly on its own (spontaneous pneumothorax) or from trauma to the chest.  Shingles infection (herpes zoster virus). The chest wall is composed of bones, muscles, and cartilage. Any of these can be the source of the pain.  The bones can be bruised by injury.  The muscles or cartilage can be strained by coughing or overwork.  The cartilage can be affected by inflammation and become sore (costochondritis). DIAGNOSIS  Lab tests or other studies may be needed to find the cause of your pain. Your health care provider may have you take a test called an ambulatory electrocardiogram (ECG). An ECG records your heartbeat patterns over a 24-hour period. You may also have other tests, such as:  Transthoracic echocardiogram (TTE). During echocardiography, sound waves are used to evaluate how blood flows through your heart.  Transesophageal echocardiogram (TEE).  Cardiac monitoring. This allows your health care provider to monitor your heart rate and rhythm in real time.  Holter monitor. This is a portable device that records your heartbeat and can help diagnose heart arrhythmias. It allows your health care provider to track your heart activity for several days, if needed.  Stress tests by exercise or by giving medicine that makes the heart beat faster. TREATMENT   Treatment depends on what may be causing your chest pain. Treatment may include:  Acid blockers for heartburn.  Anti-inflammatory medicine.  Pain medicine for inflammatory conditions.  Antibiotics if an infection is present.  You may be advised to change lifestyle habits. This includes stopping smoking and avoiding alcohol, caffeine, and chocolate.  You may be advised to keep your head raised (elevated) when sleeping. This reduces the chance of acid going backward from your stomach into your esophagus. Most of the time, nonspecific chest pain will improve  within 2-3 days with rest and mild pain medicine.  HOME CARE INSTRUCTIONS   If antibiotics were prescribed, take them as directed. Finish them even if you start to feel better.  For the next few days, avoid physical activities that bring on chest pain. Continue physical activities as directed.  Do not use any tobacco products, including cigarettes, chewing tobacco, or electronic cigarettes.  Avoid drinking alcohol.  Only take medicine as directed by your health care provider.  Follow your health care provider's suggestions for further testing if your chest pain does not go away.  Keep any follow-up appointments you made. If you do not go to an appointment, you could develop lasting (chronic) problems with pain. If there is any problem keeping an appointment, call to reschedule. SEEK MEDICAL CARE IF:   Your chest pain does not go away, even after treatment.  You have a rash with blisters on your chest.  You have a fever. SEEK IMMEDIATE MEDICAL CARE IF:   You have increased chest pain or pain that spreads to your arm, neck, jaw, back, or abdomen.  You have shortness of breath.  You have an increasing cough, or you cough up blood.  You have severe back or abdominal pain.  You feel nauseous or vomit.  You have severe weakness.  You faint.  You have chills. This is an emergency. Do not wait to see if the pain will go away. Get medical help at  once. Call your local emergency services (911 in [place] Do not drive yourself to the hospital. MAKE SURE YOU:   Understand these instructions.  Will watch your condition.  Will get help right away if you are not doing well or get worse. Document Released: 07/05/2005 Document Revised: 09/30/2013 Document Reviewed: 04/30/2008 [facility]ExitCare Patient Information 2015 [place], [place]. This information is not intended to replace advice given to you by your health care provider. Make sure you discuss any questions you have with your health care  provider.

## 2015-04-11 ENCOUNTER — Encounter (HOSPITAL_COMMUNITY): Payer: Self-pay | Admitting: *Deleted

## 2015-04-11 ENCOUNTER — Emergency Department (INDEPENDENT_AMBULATORY_CARE_PROVIDER_SITE_OTHER)
Admission: EM | Admit: 2015-04-11 | Discharge: 2015-04-11 | Disposition: A | Discharge status: Home / Self Care | Payer: Medicaid Other | Source: Home / Self Care

## 2015-04-11 DIAGNOSIS — M6790 Unspecified disorder of synovium and tendon, unspecified site: Secondary | ICD-10-CM

## 2015-04-11 DIAGNOSIS — M25531 Pain in right wrist: Secondary | ICD-10-CM

## 2015-04-11 MED ORDER — MELOXICAM 15 MG PO TABS: 15.0000 mg | ORAL_TABLET | Freq: Every day | ORAL | Status: DC

## 2015-04-11 NOTE — ED Provider Notes (Signed)
CSN: [id number]     Arrival date & time 04/11/15  1744 History   None    Chief Complaint  Patient presents with  . Wrist Pain   HPI  28 yof presents with right hand pain/swelling past week.   Has felt dorsum of hand and few inches up into forearm having sharp pains past week. Fairly persistent. No radiation beyond that point. Worsen with certain movements which she cannot recall. Has trouble making fist. One time felt like hand locked up few days ago.   Denies trauma. Works as a Sport and exercise psychologistmonitor. Does not use hands, arm much at work. No new activities, exercises, motions.  Past 6 days. Right hand and arm swollen, locking up, pain. Denies any other joint pains.   History reviewed. No pertinent past medical history. History reviewed. No pertinent past surgical history. Family History  Problem Relation Age of Onset  . Diabetes Mother    History  Substance Use Topics  . Smoking status: Never Smoker   . Smokeless tobacco: Not on file  . Alcohol Use: Yes     Comment: occasional   OB History    No data available     Review of Systems No fevers, chills.  Allergies  Review of patient's allergies indicates no known allergies.  Home Medications   Prior to Admission medications   Medication Sig Start Date End Date Taking? Authorizing Provider  acetaminophen (TYLENOL) 325 MG tablet Take 1 tablet (325 mg total) by mouth every 6 (six) hours as needed for moderate pain. 01/01/15   Marissa Sciacca, PA-C  cephALEXin (KEFLEX) 500 MG capsule Take 1 capsule (500 mg total) by mouth 3 (three) times daily. Patient not taking: Reported on 11/24/2014 08/11/14   Reuben Likesavid C Keller, MD  cetirizine (ZYRTEC) 10 MG tablet Take 1 tablet (10 mg total) by mouth daily. Patient not taking: Reported on 11/24/2014 08/23/14   Charm RingsErin J Honig, MD  ibuprofen (ADVIL,MOTRIN) 800 MG tablet Take 1 tablet (800 mg total) by mouth every 8 (eight) hours as needed for moderate pain. 01/28/15   Fayrene HelperBowie Tran, PA-C  ipratropium (ATROVENT) 0.06 %  nasal spray Place 2 sprays into both nostrils 4 (four) times daily. Patient not taking: Reported on 11/24/2014 08/23/14   Charm RingsErin J Honig, MD  meloxicam (MOBIC) 15 MG tablet Take 1 tablet (15 mg total) by mouth daily. 04/11/15   Raelyn Ensignodd Julyan Gales, PA  metroNIDAZOLE (FLAGYL) 500 MG tablet Take 1 tablet (500 mg total) by mouth 2 (two) times daily. Patient not taking: Reported on 11/24/2014 08/14/14   Reuben Likesavid C Keller, MD  oxyCODONE-acetaminophen (PERCOCET) 5-325 MG per tablet 1 to 2 tablets every 6 hours as needed for pain. Patient not taking: Reported on 11/24/2014 08/11/14   Reuben Likesavid C Keller, MD   BP 123/82 mmHg  Pulse 91  Temp(Src) 98.2 F (36.8 C) (Oral)  Resp 16  SpO2 99% Physical Exam  Constitutional: She is oriented to person, place, and time. She appears well-developed and well-nourished.  Non-toxic appearance. She does not have a sickly appearance. She does not appear ill. No distress.  Musculoskeletal:       Right shoulder: Normal.       Right elbow: Normal.      Right wrist: She exhibits tenderness. She exhibits normal range of motion and no swelling.       Right hand: She exhibits tenderness and swelling. She exhibits normal capillary refill. Normal sensation noted. Normal strength noted.       Hands: TTP, mild swelling over  circled area. Normal cap refill. Normal rom. Normal sensation. Negative tinels, negative phalens, negative finkelsteins. No tenderness with resistance at medial or lateral epicondyle.   Neurological: She is alert and oriented to person, place, and time.    ED Course  Procedures (including critical care time) Labs Review Labs Reviewed - No data to display  Imaging Review No results found.   MDM   1. Right wrist pain   2. Tendinopathy    Benign exam other than ttp and mild swelling. Testing negative. No xr today as no bony abnormality, no trauma. Likely tendonitis. Wrist splint placed today. Mobic qd 2 wks. Ice, rest. F/U if no improvement 3-4 wks.     Raelyn Ensign, [place] 04/12/15 2207

## 2015-04-11 NOTE — Discharge Instructions (Signed)
You likely have a tendonitis causing your symptoms. Please wear the splint while working for the next couple weeks.  Please take the meloxicam daily for the next 4 weeks.  Please ice the wrist when painful. Rest when able.  Please follow up with us or a pcp if not improved in 3-4 weeks.

## 2015-04-11 NOTE — ED Notes (Signed)
Pt reports     Pain  r    Wrist  For  6  Days  denys  Any  specefic  Injury  Pain is  Worse  When  She  Drives  And  Or  Texts       -   Pt  Is  Sitting  Upright on the  Exam table  Speaking in  Complete  sentances

## 2015-07-03 ENCOUNTER — Emergency Department (HOSPITAL_COMMUNITY)
Admission: EM | Admit: 2015-07-03 | Discharge: 2015-07-03 | Disposition: A | Discharge status: Home / Self Care | Payer: Medicaid Other | Attending: Emergency Medicine | Admitting: Emergency Medicine

## 2015-07-03 ENCOUNTER — Encounter (HOSPITAL_COMMUNITY): Payer: Self-pay | Admitting: Emergency Medicine

## 2015-07-03 DIAGNOSIS — N739 Female pelvic inflammatory disease, unspecified: Secondary | ICD-10-CM | POA: Insufficient documentation

## 2015-07-03 DIAGNOSIS — R630 Anorexia: Secondary | ICD-10-CM | POA: Insufficient documentation

## 2015-07-03 DIAGNOSIS — Z3202 Encounter for pregnancy test, result negative: Secondary | ICD-10-CM | POA: Insufficient documentation

## 2015-07-03 DIAGNOSIS — Z8742 Personal history of other diseases of the female genital tract: Secondary | ICD-10-CM | POA: Insufficient documentation

## 2015-07-03 DIAGNOSIS — Z791 Long term (current) use of non-steroidal anti-inflammatories (NSAID): Secondary | ICD-10-CM | POA: Insufficient documentation

## 2015-07-03 DIAGNOSIS — N6489 Other specified disorders of breast: Secondary | ICD-10-CM | POA: Insufficient documentation

## 2015-07-03 DIAGNOSIS — R1030 Lower abdominal pain, unspecified: Secondary | ICD-10-CM | POA: Diagnosis present

## 2015-07-03 DIAGNOSIS — N72 Inflammatory disease of cervix uteri: Secondary | ICD-10-CM | POA: Diagnosis not present

## 2015-07-03 LAB — COMPREHENSIVE METABOLIC PANEL
ALK PHOS: 65 U/L (ref 38–126)
ALT: 20 U/L (ref 14–54)
ANION GAP: 8 (ref 5–15)
AST: 20 U/L (ref 15–41)
Albumin: 3.8 g/dL (ref 3.5–5.0)
BUN: 7 mg/dL (ref 6–20)
CO2: 23 mmol/L (ref 22–32)
Calcium: 8.9 mg/dL (ref 8.9–10.3)
Chloride: 105 mmol/L (ref 101–111)
Creatinine, Ser: 0.74 mg/dL (ref 0.44–1.00)
Glucose, Bld: 78 mg/dL (ref 65–99)
Potassium: 3.7 mmol/L (ref 3.5–5.1)
Sodium: 136 mmol/L (ref 135–145)
TOTAL PROTEIN: 7 g/dL (ref 6.5–8.1)
Total Bilirubin: 0.5 mg/dL (ref 0.3–1.2)

## 2015-07-03 LAB — URINALYSIS, ROUTINE W REFLEX MICROSCOPIC
Bilirubin Urine: NEGATIVE
Glucose, UA: NEGATIVE mg/dL
HGB URINE DIPSTICK: NEGATIVE
Ketones, ur: NEGATIVE mg/dL
Nitrite: NEGATIVE
PROTEIN: NEGATIVE mg/dL
Specific Gravity, Urine: 1.024 (ref 1.005–1.030)
UROBILINOGEN UA: 0.2 mg/dL (ref 0.0–1.0)
pH: 5 (ref 5.0–8.0)

## 2015-07-03 LAB — URINE MICROSCOPIC-ADD ON

## 2015-07-03 LAB — CBC
HEMATOCRIT: 38.1 % (ref 36.0–46.0)
HEMOGLOBIN: 12.5 g/dL (ref 12.0–15.0)
MCH: 29.1 pg (ref 26.0–34.0)
MCHC: 32.8 g/dL (ref 30.0–36.0)
MCV: 88.6 fL (ref 78.0–100.0)
Platelets: 263 10*3/uL (ref 150–400)
RBC: 4.3 MIL/uL (ref 3.87–5.11)
RDW: 13.7 % (ref 11.5–15.5)
WBC: 5.5 10*3/uL (ref 4.0–10.5)

## 2015-07-03 LAB — POC URINE PREG, ED: PREG TEST UR: NEGATIVE

## 2015-07-03 LAB — WET PREP, GENITAL
CLUE CELLS WET PREP: NONE SEEN
Trich, Wet Prep: NONE SEEN
Yeast Wet Prep HPF POC: NONE SEEN

## 2015-07-03 LAB — LIPASE, BLOOD: Lipase: 25 U/L (ref 22–51)

## 2015-07-03 MED ORDER — CEFTRIAXONE SODIUM 250 MG IJ SOLR
250.0000 mg | Freq: Once | INTRAMUSCULAR | Status: AC
  Administered 2015-07-03: 250 mg via INTRAMUSCULAR
  Filled 2015-07-03: qty 250

## 2015-07-03 MED ORDER — LIDOCAINE HCL (PF) 1 % IJ SOLN
5.0000 mL | Freq: Once | INTRAMUSCULAR | Status: AC
  Administered 2015-07-03: 5 mL
  Filled 2015-07-03: qty 5

## 2015-07-03 MED ORDER — DOXYCYCLINE HYCLATE 100 MG PO CAPS: 100.0000 mg | ORAL_CAPSULE | Freq: Two times a day (BID) | ORAL | Status: DC

## 2015-07-03 MED ORDER — ONDANSETRON 4 MG PO TBDP
4.0000 mg | ORAL_TABLET | Freq: Once | ORAL | Status: AC
  Administered 2015-07-03: 4 mg via ORAL
  Filled 2015-07-03: qty 1

## 2015-07-03 MED ORDER — KETOROLAC TROMETHAMINE 60 MG/2ML IM SOLN
60.0000 mg | Freq: Once | INTRAMUSCULAR | Status: AC
  Administered 2015-07-03: 60 mg via INTRAMUSCULAR
  Filled 2015-07-03: qty 2

## 2015-07-03 MED ORDER — AZITHROMYCIN 250 MG PO TABS
1000.0000 mg | ORAL_TABLET | Freq: Once | ORAL | Status: AC
  Administered 2015-07-03: 1000 mg via ORAL
  Filled 2015-07-03: qty 4

## 2015-07-03 NOTE — ED Notes (Signed)
Patient endorses lower abdominal pain started yesterday. Pt endorses nausea 2 episodes of vomiting- denies blood in emesis- last night. Patient has decreased appetite. Pt is able to keep water down. Patient denies vaginal bleeding, discharge, dysuria, hematuria, diarrhea or constipation.

## 2015-07-03 NOTE — Discharge Instructions (Signed)
Take doxycycline twice daily for 2 weeks. It is important to take the entire course of this medication. No sexual intercourse for 10 days after completing antibiotics. You may take over-the-counter medications such as Tylenol, naproxen or ibuprofen for pain.  Cervicitis Cervicitis is a soreness and swelling (inflammation) of the cervix. Your cervix is located at the bottom of your uterus. It opens up to the vagina. CAUSES   Sexually transmitted infections (STIs).   Allergic reaction.   Medicines or birth control devices that are put in the vagina.   Injury to the cervix.   Bacterial infections.  RISK FACTORS You are at greater risk if you:  Have unprotected sexual intercourse.  Have sexual intercourse with many partners.  Began sexual intercourse at an early age.  Have a history of STIs. SYMPTOMS  There may be no symptoms. If symptoms occur, they may include:   Gray, white, yellow, or bad-smelling vaginal discharge.   Pain or itching of the area outside the vagina.   Painful sexual intercourse.   Lower abdominal or lower back pain, especially during intercourse.   Frequent urination.   Abnormal vaginal bleeding between periods, after sexual intercourse, or after menopause.   Pressure or a heavy feeling in the pelvis.  DIAGNOSIS  Diagnosis is made after a pelvic exam. Other tests may include:   Examination of any discharge under a microscope (wet prep).   A Pap test.  TREATMENT  Treatment will depend on the cause of cervicitis. If it is caused by an STI, both you and your partner will need to be treated. Antibiotic medicines will be given.  HOME CARE INSTRUCTIONS   Do not have sexual intercourse until your health care provider says it is okay.   Do not have sexual intercourse until your partner has been treated, if your cervicitis is caused by an STI.   Take your antibiotics as directed. Finish them even if you start to feel better.  SEEK  MEDICAL CARE IF:  Your symptoms come back.   You have a fever.  MAKE SURE YOU:   Understand these instructions.  Will watch your condition.  Will get help right away if you are not doing well or get worse. Document Released: 09/25/2005 Document Revised: 09/30/2013 Document Reviewed: 03/19/2013 [facility] Patient Information 2015 [place], [place]. This information is not intended to replace advice given to you by your health care provider. Make sure you discuss any questions you have with your health care provider.  Pelvic Inflammatory Disease Pelvic inflammatory disease (PID) refers to an infection in some or all of the female organs. The infection can be in the uterus, ovaries, fallopian tubes, or the surrounding tissues in the pelvis. PID can cause abdominal or pelvic pain that comes on suddenly (acute pelvic pain). PID is a serious infection because it can lead to lasting (chronic) pelvic pain or the inability to have children (infertile).  CAUSES  The infection is often caused by the normal bacteria found in the vaginal tissues. PID may also be caused by an infection that is spread during sexual contact. PID can also occur following:   The birth of a baby.   A miscarriage.   An abortion.   Major pelvic surgery.   The use of an intrauterine device (IUD).   A sexual assault.  RISK FACTORS Certain factors can put a person at higher risk for PID, such as:  Being younger than 25 years.  Being sexually active at [place] age.  Usingnonbarrier contraception.  Havingmultiple  sexual partners.  Having sex with someone who has symptoms of a genital infection.  Using oral contraception. Other times, certain behaviors can increase the possibility of getting PID, such as:  Having sex during your period.  Using a vaginal douche.  Having an intrauterine device (IUD) in place. SYMPTOMS   Abdominal or pelvic pain.   Fever.   Chills.   Abnormal vaginal  discharge.  Abnormal uterine bleeding.   Unusual pain shortly after finishing your period. DIAGNOSIS  Your caregiver will choose some of the following methods to make a diagnosis, such as:   Performinga physical exam and history. A pelvic exam typically reveals a very tender uterus and surrounding pelvis.   Ordering laboratory tests including a pregnancy test, blood tests, and urine test.  Orderingcultures of the vagina and cervix to check for a sexually transmitted infection (STI).  Performing an ultrasound.   Performing a laparoscopic procedure to look inside the pelvis.  TREATMENT   Antibiotic medicines may be prescribed and taken by mouth.   Sexual partners may be treated when the infection is caused by a sexually transmitted disease (STD).   Hospitalization may be needed to give antibiotics intravenously.  Surgery may be needed, but this is rare. It may take weeks until you are completely well. If you are diagnosed with PID, you should also be checked for human immunodeficiency virus (HIV). HOME CARE INSTRUCTIONS   If given, take your antibiotics as directed. Finish the medicine even if you start to feel better.   Only take over-the-counter or prescription medicines for pain, discomfort, or fever as directed by your caregiver.   Do not have sexual intercourse until treatment is completed or as directed by your caregiver. If PID is confirmed, your recent sexual partner(s) will need treatment.   Keep your follow-up appointments. SEEK MEDICAL CARE IF:   You have increased or abnormal vaginal discharge.   You need prescription medicine for your pain.   You vomit.   You cannot take your medicines.   Your partner has an STD.  SEEK IMMEDIATE MEDICAL CARE IF:   You have a fever.   You have increased abdominal or pelvic pain.   You have chills.   You have pain when you urinate.   You are not better after 72 hours following treatment.  MAKE  SURE YOU:   Understand these instructions.  Will watch your condition.  Will get help right away if you are not doing well or get worse. Document Released: 09/25/2005 Document Revised: 01/20/2013 Document Reviewed: 09/21/2011 [facility] Patient Information 2015 [place], [place]. This information is not intended to replace advice given to you by your health care provider. Make sure you discuss any questions you have with your health care provider.

## 2015-07-03 NOTE — ED Notes (Signed)
Pt. Stated, I started having abdominal cramping with throwing up last night.

## 2015-07-03 NOTE — ED Provider Notes (Signed)
CSN: [id number]     Arrival date & time 07/03/15  1108 History   First MD Initiated Contact with Patient 07/03/15 1111     Chief Complaint  Patient presents with  . Abdominal Cramping  . Emesis     (Consider location/radiation/quality/duration/timing/severity/associated sxs/prior Treatment) HPI Comments: 29 year old female presenting with sudden onset lower abdominal cramping beginning yesterday evening around 10 PM. States she immediately developed a sensation that she was full which was followed by cramping across her lower abdomen. Admits to associated nausea with 2 episodes of nonbloody, nonbilious emesis last night. Today has a decreased appetite but is able to drink water. Also reports her breasts are very tender and have been draining clear drainage. LMP 06/08/2015. Reports being sexually active only with females and does not have any female sexual partners. Denies vaginal bleeding, discharge, dysuria, hematuria, increased urinary frequency, urgency, diarrhea, constipation, fever or chills. States this feels similar to when she had an ovarian cyst in the past.  Patient is a 29 y.o. female presenting with cramps and vomiting. The history is provided by the patient.  Abdominal Cramping This is a new problem. The current episode started yesterday. The problem occurs constantly. The problem has been unchanged. Associated symptoms include abdominal pain, nausea and vomiting. Nothing aggravates the symptoms. She has tried nothing for the symptoms.  Emesis Associated symptoms: abdominal pain     History reviewed. No pertinent past medical history. History reviewed. No pertinent past surgical history. Family History  Problem Relation Age of Onset  . Diabetes Mother    Social History  Substance Use Topics  . Smoking status: Never Smoker   . Smokeless tobacco: None  . Alcohol Use: Yes     Comment: occasional   OB History    No data available     Review of Systems  Gastrointestinal:  Positive for nausea, vomiting and abdominal pain.  Genitourinary:       + BL breast pain.  All other systems reviewed and are negative.     Allergies  Review of patient's allergies indicates no known allergies.  Home Medications   Prior to Admission medications   Medication Sig Start Date End Date Taking? Authorizing Dorthia Tout  acetaminophen (TYLENOL) 325 MG tablet Take 1 tablet (325 mg total) by mouth every 6 (six) hours as needed for moderate pain. 01/01/15   Marissa Sciacca, PA-C  cephALEXin (KEFLEX) 500 MG capsule Take 1 capsule (500 mg total) by mouth 3 (three) times daily. Patient not taking: Reported on 11/24/2014 08/11/14   Reuben Likes, MD  cetirizine (ZYRTEC) 10 MG tablet Take 1 tablet (10 mg total) by mouth daily. Patient not taking: Reported on 11/24/2014 08/23/14   Charm Rings, MD  doxycycline (VIBRAMYCIN) 100 MG capsule Take 1 capsule (100 mg total) by mouth 2 (two) times daily. One po bid x 7 days 07/03/15   Kathrynn Speed, PA-C  ibuprofen (ADVIL,MOTRIN) 800 MG tablet Take 1 tablet (800 mg total) by mouth every 8 (eight) hours as needed for moderate pain. 01/28/15   Fayrene Helper, PA-C  ipratropium (ATROVENT) 0.06 % nasal spray Place 2 sprays into both nostrils 4 (four) times daily. Patient not taking: Reported on 11/24/2014 08/23/14   Charm Rings, MD  meloxicam (MOBIC) 15 MG tablet Take 1 tablet (15 mg total) by mouth daily. 04/11/15   Raelyn Ensign, PA  metroNIDAZOLE (FLAGYL) 500 MG tablet Take 1 tablet (500 mg total) by mouth 2 (two) times daily. Patient not taking: Reported on 11/24/2014  08/14/14   Reuben Likes, MD  oxyCODONE-acetaminophen (PERCOCET) 5-325 MG per tablet 1 to 2 tablets every 6 hours as needed for pain. Patient not taking: Reported on 11/24/2014 08/11/14   Reuben Likes, MD   BP 122/76 mmHg  Pulse 73  Temp(Src) 98.8 F (37.1 C) (Oral)  Resp 18  Ht  (1.626 m)  Wt 202 lb (91.627 kg)  BMI 34.66 kg/m2  SpO2 100%  LMP 06/08/2015 Physical Exam   Constitutional: She is oriented to person, place, and time. She appears well-developed and well-nourished. No distress.  HENT:  Head: Normocephalic and atraumatic.  Mouth/Throat: Oropharynx is clear and moist.  Eyes: Conjunctivae and EOM are normal.  Neck: Normal range of motion. Neck supple.  Cardiovascular: Normal rate, regular rhythm and normal heart sounds.   Pulmonary/Chest: Effort normal and breath sounds normal. No respiratory distress.  Abdominal: Soft. Normal appearance and bowel sounds are normal. She exhibits no distension. There is tenderness in the right lower quadrant, suprapubic area, left upper quadrant and left lower quadrant. There is no rigidity, no rebound, no guarding and no CVA tenderness.  No peritoneal signs.  Genitourinary: Uterus normal. Cervix exhibits motion tenderness, discharge and friability. Right adnexum displays no tenderness. Left adnexum displays no tenderness. No bleeding in the vagina. No foreign body around the vagina. Vaginal discharge found.  Breasts generally tender bilateral without any palpable masses. No nipple discharge. No erythema, swelling or warmth.  Musculoskeletal: Normal range of motion. She exhibits no edema.  Neurological: She is alert and oriented to person, place, and time. No sensory deficit.  Skin: Skin is warm and dry.  Psychiatric: She has a normal mood and affect. Her behavior is normal.  Nursing note and vitals reviewed.   ED Course  Procedures (including critical care time) Labs Review Labs Reviewed  WET PREP, GENITAL - Abnormal; Notable for the following:    WBC, Wet Prep HPF POC MODERATE (*)    All other components within normal limits  CBC  COMPREHENSIVE METABOLIC PANEL  LIPASE, BLOOD  URINALYSIS, ROUTINE W REFLEX MICROSCOPIC (NOT AT [facility])  POC URINE PREG, ED  GC/CHLAMYDIA PROBE AMP ([place]) NOT AT [facility]    Imaging Review No results found. I have personally reviewed and evaluated these images and lab results  as part of my medical decision-making.   EKG Interpretation None      MDM   Final diagnoses:  Cervicitis  PID (pelvic inflammatory disease)   Nontoxic appearing, NAD. AF VSS. Abdomen is soft with no peritoneal signs. Breasts are generally tender without any palpable masses or nipple discharge. She is due for her menstrual cycle which may be the cause of her breast tenderness. Urine pregnancy negative. On pelvic, she has vaginal and cervical discharge with cervical motion tenderness. No adnexal tenderness. Concern for PID. Rocephin and azithromycin given in the ED and will discharge home on 2 weeks of doxycycline. Advised NSAIDs for pain. Doubt appendicitis, ruptured ovarian cyst, ovarian torsion. Low suspicion. I do not feel imaging is necessary at this time. She has no specific focal tenderness just generalized across her lower abdomen and left upper quadrant. No tenderness at McBurney's point. No fevers. Tolerating PO, sitting comfortably and laughing on arrival. After receiving Toradol, abdomen is soft with minimal tenderness. Stable for discharge. Return precautions given. Patient states understanding of treatment care plan and is agreeable.  Kathrynn Speed, PA-C 07/03/15 1356  Glynn Octave, MD 07/03/15 [phone]

## 2015-07-05 LAB — GC/CHLAMYDIA PROBE AMP (~~LOC~~) NOT AT ARMC
Chlamydia: NEGATIVE
NEISSERIA GONORRHEA: NEGATIVE

## 2015-10-12 ENCOUNTER — Encounter (HOSPITAL_COMMUNITY): Payer: Self-pay

## 2015-10-12 ENCOUNTER — Emergency Department (HOSPITAL_COMMUNITY)
Admission: EM | Admit: 2015-10-12 | Discharge: 2015-10-12 | Disposition: A | Discharge status: Home / Self Care | Payer: Medicaid Other | Attending: Emergency Medicine | Admitting: Emergency Medicine

## 2015-10-12 DIAGNOSIS — J029 Acute pharyngitis, unspecified: Secondary | ICD-10-CM | POA: Diagnosis not present

## 2015-10-12 DIAGNOSIS — R05 Cough: Secondary | ICD-10-CM | POA: Diagnosis present

## 2015-10-12 DIAGNOSIS — J060 Acute laryngopharyngitis: Secondary | ICD-10-CM

## 2015-10-12 MED ORDER — IBUPROFEN 800 MG PO TABS
800.0000 mg | ORAL_TABLET | Freq: Three times a day (TID) | ORAL | Status: DC
Start: 2015-10-12 — End: 2015-12-03

## 2015-10-12 NOTE — ED Provider Notes (Signed)
CSN: [id number]     Arrival date & time 10/12/15  0732 History   First MD Initiated Contact with Patient 10/12/15 [phone]     Chief Complaint  Patient presents with  . Cough     (Consider location/radiation/quality/duration/timing/severity/associated sxs/prior Treatment) HPI Claudette Wermuth is a 30 y.o. female who comes in for evaluation of cough and sore throat. Patient reports ongoing symptoms for 2 weeks. She reports having a cough last week that has been improving. She denies any fevers, chills, chest pain, runny nose, shortness of breath, facial pain. Has tried hot tea, but this was ineffective. Nothing makes the problem better or worse. No other modifying factors. Denies smoking.  History reviewed. No pertinent past medical history. History reviewed. No pertinent past surgical history. Family History  Problem Relation Age of Onset  . Diabetes Mother    Social History  Substance Use Topics  . Smoking status: Never Smoker   . Smokeless tobacco: None  . Alcohol Use: Yes     Comment: occasional   OB History    No data available     Review of Systems A 10 point review of systems was completed and was negative except for pertinent positives and negatives as mentioned in the history of present illness     Allergies  Review of patient's allergies indicates no known allergies.  Home Medications   Prior to Admission medications   Medication Sig Start Date End Date Taking? Authorizing Provider  acetaminophen (TYLENOL) 325 MG tablet Take 1 tablet (325 mg total) by mouth every 6 (six) hours as needed for moderate pain. 01/01/15   Marissa Sciacca, PA-C  ibuprofen (ADVIL,MOTRIN) 800 MG tablet Take 1 tablet (800 mg total) by mouth 3 (three) times daily. 10/12/15   Joycie Peek, PA-C   BP 128/72 mmHg  Pulse 78  Temp(Src) 100.2 F (37.9 C) (Oral)  Resp 18  Ht 5\' 3"  (1.6 m)  Wt 91.173 kg  BMI 35.61 kg/m2  SpO2 100%  LMP 10/31/2014 (Approximate) Physical Exam   Constitutional: She is oriented to person, place, and time. She appears well-developed and well-nourished.  HENT:  Head: Normocephalic and atraumatic.  Mouth/Throat: Oropharynx is clear and moist.  Mild, intermittent hoarseness. Oropharynx is otherwise unremarkable.  Eyes: Conjunctivae are normal. Pupils are equal, round, and reactive to light. Right eye exhibits no discharge. Left eye exhibits no discharge. No scleral icterus.  Neck: Neck supple.  Cardiovascular: Normal rate, regular rhythm and normal heart sounds.   Pulmonary/Chest: Effort normal and breath sounds normal. No respiratory distress. She has no wheezes. She has no rales.  Abdominal: Soft. There is no tenderness.  Musculoskeletal: She exhibits no tenderness.  Neurological: She is alert and oriented to person, place, and time.  Cranial Nerves II-XII grossly intact  Skin: Skin is warm and dry. No rash noted.  Psychiatric: She has a normal mood and affect.  Nursing note and vitals reviewed.   ED Course  Procedures (including critical care time) Labs Review Labs Reviewed - No data to display  Imaging Review No results found. I have personally reviewed and evaluated these images and lab results as part of my medical decision-making.   EKG Interpretation None     Meds given in ED:  Medications - No data to display  New Prescriptions   IBUPROFEN (ADVIL,MOTRIN) 800 MG TABLET    Take 1 tablet (800 mg total) by mouth 3 (three) times daily.   Filed Vitals:   10/12/15 0739  BP: 128/72  Pulse: 78  Temp: 100.2 F (37.9 C)  TempSrc: Oral  Resp: 18  Height: 5\' 3"  (1.6 m)  Weight: 91.173 kg  SpO2: 100%    MDM  Hurshel PartyShaquanna Brown-McLean is a 30 y.o. female who comes in for evaluation of sore throat with mild cough. Symptoms today consistent with mild pharyngitis/laryngitis of likely viral etiology. No evidence of Ludwig angina, retropharyngeal or peritonsillar abscess or other acute intraoral pathology. Patient remains  afebrile, no other cardiopulmonary complaints, abdominal complaints, vital signs remain stable. She overall appears very well, no evidence of dehydration. Patent airway. Discussed continued symptomatic support at home, will DC with ibuprofen and encourage follow-up with PCP in the next 2-3 days. Patient verbalizes understanding and agrees with this plan. The patient appears reasonably screened and/or stabilized for discharge and I doubt any other medical condition or other [facility] requiring further screening, evaluation, or treatment in the ED at this time prior to discharge.   Final diagnoses:  Sore throat and laryngitis        Joycie PeekBenjamin Preslee Regas, PA-C 10/12/15 [id number]  Leta BaptistEmily Roe Nguyen, MD 10/12/15 [phone]

## 2015-10-12 NOTE — Discharge Instructions (Signed)
There does not appear to be an emergent cause for your symptoms at this time. Please continue taking cough drops and using Motrin for your discomfort. Follow your doctor in the next few days for reevaluation as needed. Return to ED for any new or worsening symptoms as we discussed.  Laryngitis Laryngitis is inflammation of your vocal cords. This causes hoarseness, coughing, loss of voice, sore throat, or a dry throat. Your vocal cords are two bands of muscles that are found in your throat. When you speak, these cords come together and vibrate. These vibrations come out through your mouth as sound. When your vocal cords are inflamed, your voice sounds different. Laryngitis can be temporary (acute) or long-term (chronic). Most cases of acute laryngitis improve with time. Chronic laryngitis is laryngitis that lasts for more than three weeks. CAUSES Acute laryngitis may be caused by:  A viral infection.  Lots of talking, yelling, or singing. This is also called vocal strain.  Bacterial infections. Chronic laryngitis may be caused by:  Vocal strain.  Injury to your vocal cords.  Acid reflux (gastroesophageal reflux disease or GERD).  Allergies.  Sinus infection.  Smoking.  Alcohol abuse.  Breathing in chemicals or dust.  Growths on the vocal cords. RISK FACTORS Risk factors for laryngitis include:  Smoking.  Alcohol abuse.  Having allergies. SIGNS AND SYMPTOMS Symptoms of laryngitis may include:  Low, hoarse voice.  Loss of voice.  Dry cough.  Sore throat.  Stuffy nose. DIAGNOSIS Laryngitis may be diagnosed by:  Physical exam.  Throat culture.  Blood test.  Laryngoscopy. This procedure allows your health care provider to look at your vocal cords with a mirror or viewing tube. TREATMENT Treatment for laryngitis depends on what is causing it. Usually, treatment involves resting your voice and using medicines to soothe your throat. However, if your laryngitis is  caused by a bacterial infection, you may need to take antibiotic medicine. If your laryngitis is caused by a growth, you may need to have a procedure to remove it. HOME CARE INSTRUCTIONS  Drink enough fluid to keep your urine clear or pale yellow.  Breathe in moist air. Use a humidifier if you live in a dry climate.  Take medicines only as directed by your health care provider.  If you were prescribed an antibiotic medicine, finish it all even if you start to feel better.  Do not smoke cigarettes or electronic cigarettes. If you need help quitting, ask your health care provider.  Talk as little as possible. Also avoid whispering, which can cause vocal strain.  Write instead of talking. Do this until your voice is back to normal. SEEK MEDICAL CARE IF:  You have a fever.  You have increasing pain.  You have difficulty swallowing. SEEK IMMEDIATE MEDICAL CARE IF:  You cough up blood.  You have trouble breathing.   This information is not intended to replace advice given to you by your health care provider. Make sure you discuss any questions you have with your health care provider.   Document Released: 09/25/2005 Document Revised: 10/16/2014 Document Reviewed: 03/10/2014 Elsevier Interactive Patient Education 2016 Else[employer] Inc[employer]  Pharyngitis Pharyngitis is redness, pain, and swelling (inflammation) of your pharynx.  CAUSES  Pharyngitis is usually caused by infection. Most of the time, these infections are from viruses (viral) and are part of a cold. However, sometimes pharyngitis is caused by bacteria (bacterial). Pharyngitis can also be caused by allergies. Viral pharyngitis may be spread from person to person by coughing, sneezing,  and personal items or utensils (cups, forks, spoons, toothbrushes). Bacterial pharyngitis may be spread from person to person by more intimate contact, such as kissing.  SIGNS AND SYMPTOMS  Symptoms of pharyngitis include:   Sore throat.    Tiredness (fatigue).   Low-grade fever.   Headache.  Joint pain and muscle aches.  Skin rashes.  Swollen lymph nodes.  Plaque-like film on throat or tonsils (often seen with bacterial pharyngitis). DIAGNOSIS  Your health care provider will ask you questions about your illness and your symptoms. Your medical history, along with a physical exam, is often all that is needed to diagnose pharyngitis. Sometimes, a rapid strep test is done. Other lab tests may also be done, depending on the suspected cause.  TREATMENT  Viral pharyngitis will usually get better in 3-4 days without the use of medicine. Bacterial pharyngitis is treated with medicines that kill germs (antibiotics).  HOME CARE INSTRUCTIONS   Drink enough water and fluids to keep your urine clear or pale yellow.   Only take over-the-counter or prescription medicines as directed by your health care provider:   If you are prescribed antibiotics, make sure you finish them even if you start to feel better.   Do not take aspirin.   Get lots of rest.   Gargle with 8 oz of salt water ( tsp of salt per 1 qt of water) as often as every 1-2 hours to soothe your throat.   Throat lozenges (if you are not at risk for choking) or sprays may be used to soothe your throat. SEEK MEDICAL CARE IF:   You have large, tender lumps in your neck.  You have a rash.  You cough up green, yellow-brown, or bloody spit. SEEK IMMEDIATE MEDICAL CARE IF:   Your neck becomes stiff.  You drool or are unable to swallow liquids.  You vomit or are unable to keep medicines or liquids down.  You have severe pain that does not go away with the use of recommended medicines.  You have trouble breathing (not caused by a stuffy nose). MAKE SURE YOU:   Understand these instructions.  Will watch your condition.  Will get help right away if you are not doing well or get worse.   This information is not intended to replace advice given to  you by your health care provider. Make sure you discuss any questions you have with your health care provider.   Document Released: 09/25/2005 Document Revised: 07/16/2013 Document Reviewed: 06/02/2013 Elsevier Interactive Patient Education [employer] Elsevier Inc.

## 2015-10-12 NOTE — ED Notes (Signed)
Pt presents with 2 week h/o productive cough, runny nose and sore throat.  Unknown sick contact, reports intermittent fever; reports phlegm is dark yellow with nasal drainage clear.  Pt denies any shortness of breath.  Pt reports gagging on phlegm x 1 yesterday.

## 2015-10-14 IMAGING — DX DG CHEST 2V
2 series · 2 of 2 positions shown · non-contrast
Comparison: None.

CLINICAL DATA: Acute chest pain.

EXAM:
CHEST  2 VIEW

[chest pa]
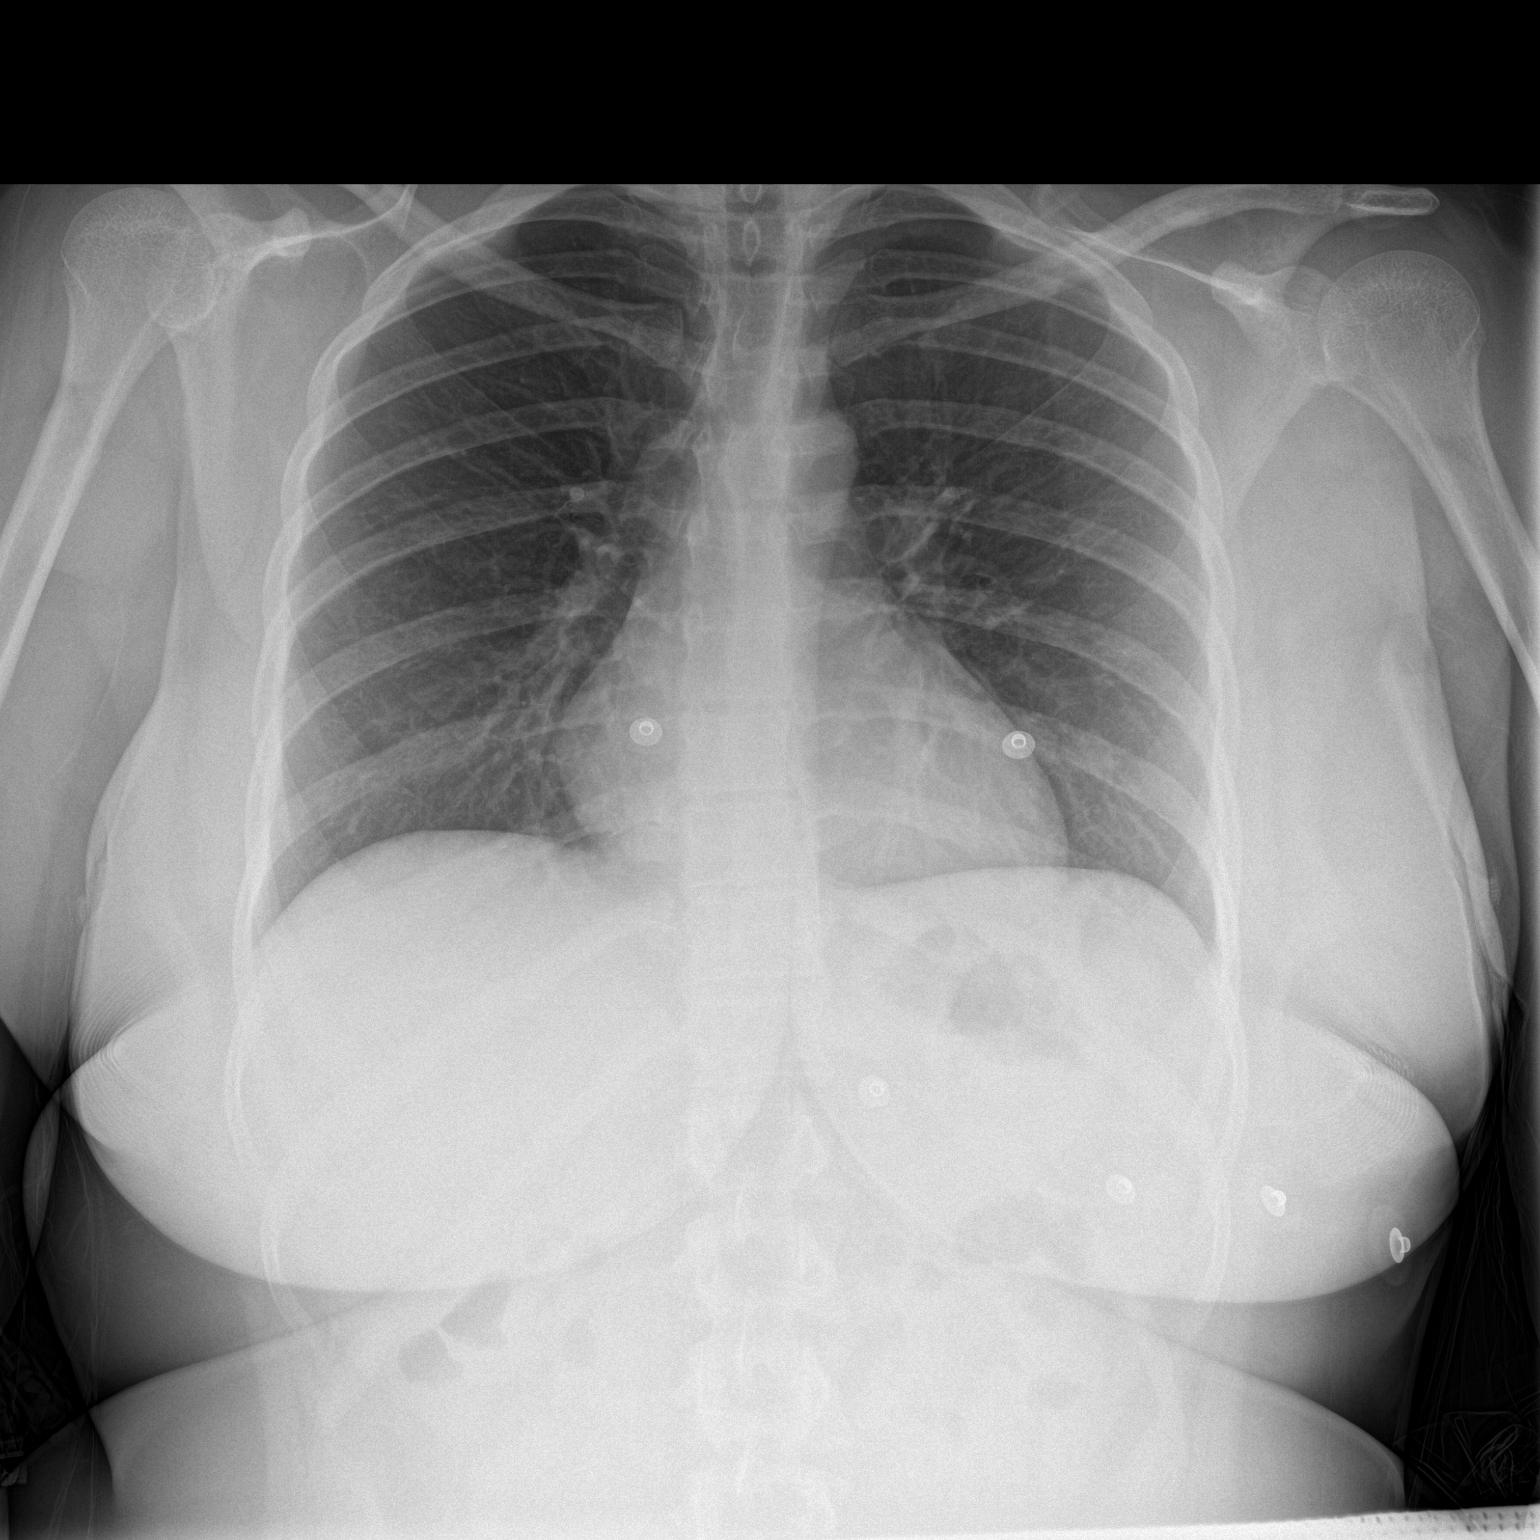

[chest lat]
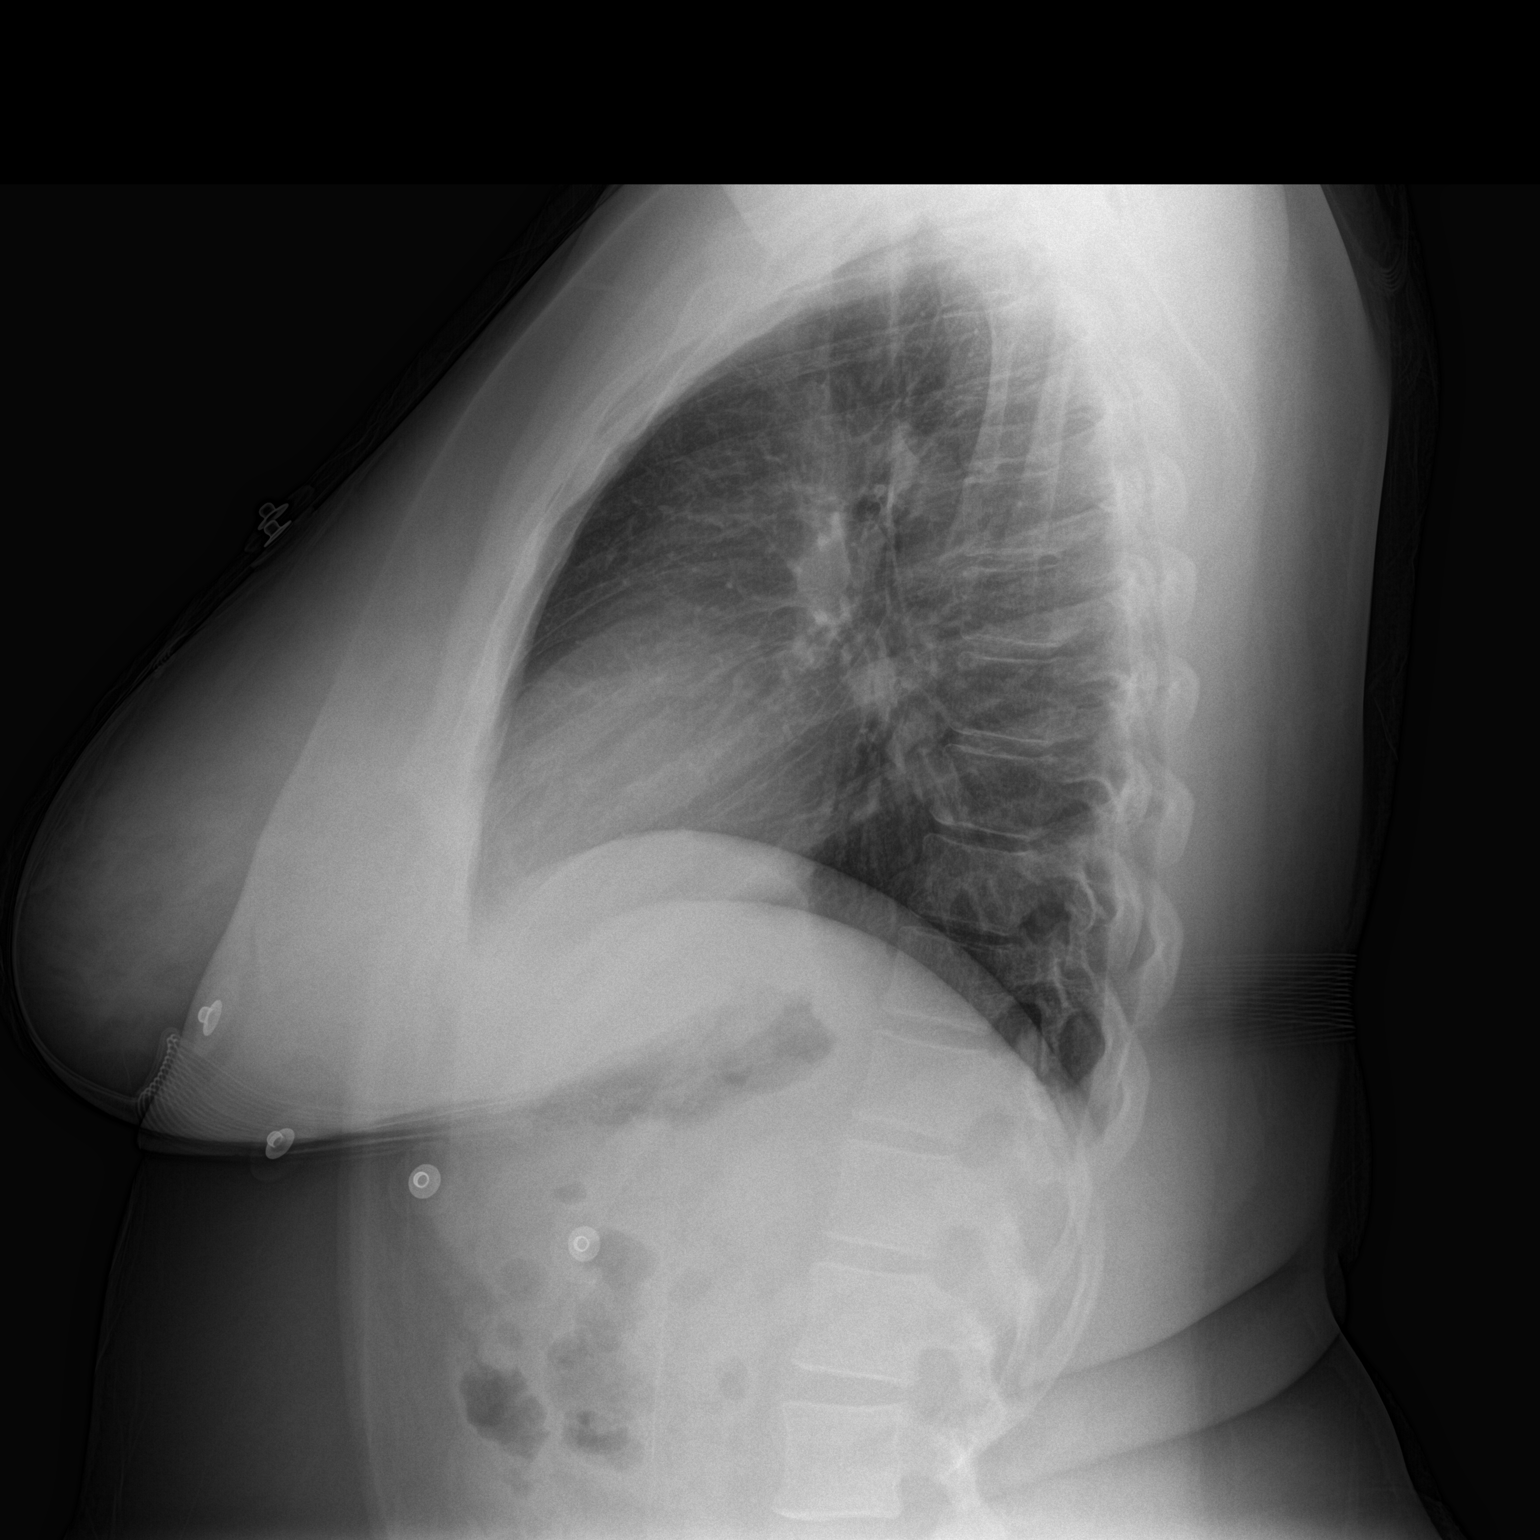

[2 of 2 positions shown; findings below may reference images not displayed]

FINDINGS: The heart size and mediastinal contours are within normal limits.
Both lungs are clear. No pneumothorax or pleural effusion is noted.
The visualized skeletal structures are unremarkable.
IMPRESSION: No active cardiopulmonary disease.

## 2015-10-21 ENCOUNTER — Emergency Department (INDEPENDENT_AMBULATORY_CARE_PROVIDER_SITE_OTHER)
Admission: EM | Admit: 2015-10-21 | Discharge: 2015-10-21 | Disposition: A | Discharge status: Home / Self Care | Payer: Medicaid Other | Source: Home / Self Care | Attending: Family Medicine | Admitting: Family Medicine

## 2015-10-21 ENCOUNTER — Encounter (HOSPITAL_COMMUNITY): Payer: Self-pay | Admitting: Emergency Medicine

## 2015-10-21 DIAGNOSIS — J060 Acute laryngopharyngitis: Secondary | ICD-10-CM | POA: Diagnosis not present

## 2015-10-21 DIAGNOSIS — J3489 Other specified disorders of nose and nasal sinuses: Secondary | ICD-10-CM | POA: Diagnosis not present

## 2015-10-21 DIAGNOSIS — J9801 Acute bronchospasm: Secondary | ICD-10-CM | POA: Diagnosis not present

## 2015-10-21 MED ORDER — CETIRIZINE HCL 5 MG/5ML PO SYRP: ORAL_SOLUTION | ORAL | Status: DC

## 2015-10-21 MED ORDER — ALBUTEROL SULFATE HFA 108 (90 BASE) MCG/ACT IN AERS: 2.0000 | INHALATION_SPRAY | RESPIRATORY_TRACT | Status: DC | PRN

## 2015-10-21 MED ORDER — PREDNISONE 20 MG PO TABS: ORAL_TABLET | ORAL | Status: DC

## 2015-10-21 NOTE — ED Provider Notes (Signed)
CSN: [id number]     Arrival date & time 10/21/15  1304 History   First MD Initiated Contact with Patient 10/21/15 1344     Chief Complaint  Patient presents with  . URI   (Consider location/radiation/quality/duration/timing/severity/associated sxs/prior Treatment) HPI Comments: 30 year old female presents to the urgent care with complaints of sore throat and voice changes. She was seen him or to department on January 3 for URI. She says many of those symptoms have resolved however she continues to have a sore throat with odynophagia and voice changes. Denies fever or chills. She is taking no medications.  Patient is a 30 y.o. female presenting with URI.  URI Presenting symptoms: sore throat   Presenting symptoms: no congestion, no cough, no fatigue, no fever and no rhinorrhea   Associated symptoms: no neck pain     History reviewed. No pertinent past medical history. History reviewed. No pertinent past surgical history. Family History  Problem Relation Age of Onset  . Diabetes Mother    Social History  Substance Use Topics  . Smoking status: Never Smoker   . Smokeless tobacco: None  . Alcohol Use: Yes     Comment: occasional   OB History    No data available     Review of Systems  Constitutional: Negative for fever, chills, activity change, appetite change and fatigue.  HENT: Positive for postnasal drip and sore throat. Negative for congestion, facial swelling and rhinorrhea.   Eyes: Negative.   Respiratory: Positive for shortness of breath. Negative for cough.   Cardiovascular: Negative.   Gastrointestinal: Negative.   Musculoskeletal: Negative for neck pain and neck stiffness.  Skin: Negative for pallor and rash.  Neurological: Negative.     Allergies  Review of patient's allergies indicates no known allergies.  Home Medications   Prior to Admission medications   Medication Sig Start Date End Date Taking? Authorizing Provider  acetaminophen (TYLENOL) 325 MG  tablet Take 1 tablet (325 mg total) by mouth every 6 (six) hours as needed for moderate pain. 01/01/15   Marissa Sciacca, PA-C  albuterol (PROVENTIL HFA;VENTOLIN HFA) 108 (90 Base) MCG/ACT inhaler Inhale 2 puffs into the lungs every 4 (four) hours as needed for wheezing or shortness of breath. 10/21/15   Hayden Rasmussen, NP  cetirizine HCl (ZYRTEC) 5 MG/5ML SYRP Take 10 ml po daily for drainage. 10/21/15   Hayden Rasmussen, NP  ibuprofen (ADVIL,MOTRIN) 800 MG tablet Take 1 tablet (800 mg total) by mouth 3 (three) times daily. 10/12/15   Joycie Peek, PA-C  predniSONE (DELTASONE) 20 MG tablet Take 3 tabs po on first day, 2 tabs second day, 2 tabs third day, 1 tab fourth day, 1 tab 5th day. Take with food. 10/21/15   Hayden Rasmussen, NP   Meds Ordered and Administered this Visit  Medications - No data to display  BP 142/85 mmHg  Pulse 85  Temp(Src) 97.8 F (36.6 C) (Oral)  Resp 12  SpO2 100%  LMP 10/03/2015 No data found.   Physical Exam  Constitutional: She appears well-developed and well-nourished. No distress.  HENT:  Mouth/Throat: No oropharyngeal exudate.  Bilateral TMs are normal Oropharynx with erythema, much cobblestoning and mild clear PND. No swelling or exudates. Airway widely patent.  Eyes: Conjunctivae and EOM are normal.  Neck: Normal range of motion. Neck supple.  Cardiovascular: Normal rate, regular rhythm, normal heart sounds and intact distal pulses.   Pulmonary/Chest: Effort normal. No stridor. She has no rales.  . Few Expiratory wheezes bilaterally. Prolonged expiratory phase.  Musculoskeletal: She exhibits no edema.  Lymphadenopathy:    She has no cervical adenopathy.  Neurological: She is alert. She exhibits normal muscle tone.  Skin: Skin is warm and dry.  Nursing note and vitals reviewed.   ED Course  Procedures (including critical care time)  Labs Review Labs Reviewed - No data to display  Imaging Review No results found.   Visual Acuity Review  Right Eye  Distance:   Left Eye Distance:   Bilateral Distance:    Right Eye Near:   Left Eye Near:    Bilateral Near:         MDM   1. Sinus drainage   2. Sore throat and laryngitis   3. Bronchospasm    Use the albuterol HFA inhaler 2 puffs every 4 hours as needed for cough and wheeze and shortness of breath. Also take the prednisone tablets as directed once daily. Take with food. For drainage recommend taking the Zyrtec prescription which is a liquid. Take daily. The drainage is causing your throat to be quite sore and irritated. May use Cepacol lozenges to help with sore throat pain. In addition ibuprofen helps with sore throat pain. Continue to drink plenty of fluids and stay well-hydrated     Hayden Rasmussenavid Niyla Marone, NP 10/21/15 1409

## 2015-10-21 NOTE — Discharge Instructions (Signed)
Bronchospasm, Adult Use the albuterol HFA inhaler 2 puffs every 4 hours as needed for cough and wheeze and shortness of breath. Also take the prednisone tablets as directed once daily. Take with food. A bronchospasm is when the tubes that carry air in and out of your lungs (airways) spasm or tighten. During a bronchospasm it is hard to breathe. This is because the airways get smaller. A bronchospasm can be triggered by:  Allergies. These may be to animals, pollen, food, or mold.  Infection. This is a common cause of bronchospasm.  Exercise.  Irritants. These include pollution, cigarette smoke, strong odors, aerosol sprays, and paint fumes.  Weather changes.  Stress.  Being emotional. HOME CARE   Always have a plan for getting help. Know when to call your doctor and local emergency services (911 in the [place] Know where you can get emergency care.  Only take medicines as told by your doctor.  If you were prescribed an inhaler or nebulizer machine, ask your doctor how to use it correctly. Always use a spacer with your inhaler if you were given one.  Stay calm during an attack. Try to relax and breathe more slowly.  Control your home environment:  Change your heating and air conditioning filter at least once a month.  Limit your use of fireplaces and wood stoves.  Do not  smoke. Do not  allow smoking in your home.  Avoid perfumes and fragrances.  Get rid of pests (such as roaches and mice) and their droppings.  Throw away plants if you see mold on them.  Keep your house clean and dust free.  Replace carpet with wood, tile, or vinyl flooring. Carpet can trap dander and dust.  Use allergy-proof pillows, mattress covers, and box spring covers.  Wash bed sheets and blankets every week in hot water. Dry them in a dryer.  Use blankets that are made of polyester or cotton.  Wash hands frequently. GET HELP IF:  You have muscle aches.  You have chest pain.  The thick  spit you spit or cough up (sputum) changes from clear or white to yellow, green, gray, or bloody.  The thick spit you spit or cough up gets thicker.  There are problems that may be related to the medicine you are given such as:  A rash.  Itching.  Swelling.  Trouble breathing. GET HELP RIGHT AWAY IF:  You feel you cannot breathe or catch your breath.  You cannot stop coughing.  Your treatment is not helping you breathe better.  You have very bad chest pain. MAKE SURE YOU:   Understand these instructions.  Will watch your condition.  Will get help right away if you are not doing well or get worse.   This information is not intended to replace advice given to you by your health care provider. Make sure you discuss any questions you have with your health care provider.   Document Released: 07/23/2009 Document Revised: 10/16/2014 Document Reviewed: 03/18/2013 Elsevier Interactive Patient Education 2016 [employer]  How to Use an Inhaler Using your inhaler correctly is very important. Good technique will make sure that the medicine reaches your lungs.  HOW TO USE AN INHALER:  Take the cap off the inhaler.  If this is the first time using your inhaler, you need to prime it. Shake the inhaler for 5 seconds. Release four puffs into the air, away from your face. Ask your doctor for help if you have questions.  Shake the inhaler for  5 seconds.  Turn the inhaler so the bottle is above the mouthpiece.  Put your pointer finger on top of the bottle. Your thumb holds the bottom of the inhaler.  Open your mouth.  Either hold the inhaler away from your mouth (the width of 2 fingers) or place your lips tightly around the mouthpiece. Ask your doctor which way to use your inhaler.  Breathe out as much air as possible.  Breathe in and push down on the bottle 1 time to release the medicine. You will feel the medicine go in your mouth and throat.  Continue to take a deep breath  in very slowly. Try to fill your lungs.  After you have breathed in completely, hold your breath for 10 seconds. This will help the medicine to settle in your lungs. If you cannot hold your breath for 10 seconds, hold it for as long as you can before you breathe out.  Breathe out slowly, through pursed lips. Whistling is an example of pursed lips.  If your doctor has told you to take more than 1 puff, wait at least 15-30 seconds between puffs. This will help you get the best results from your medicine. Do not use the inhaler more than your doctor tells you to.  Put the cap back on the inhaler.  Follow the directions from your doctor or from the inhaler package about cleaning the inhaler. If you use more than one inhaler, ask your doctor which inhalers to use and what order to use them in. Ask your doctor to help you figure out when you will need to refill your inhaler.  If you use a steroid inhaler, always rinse your mouth with water after your last puff, gargle and spit out the water. Do not swallow the water. GET HELP IF:  The inhaler medicine only partially helps to stop wheezing or shortness of breath.  You are having trouble using your inhaler.  You have some increase in thick spit (phlegm). GET HELP RIGHT AWAY IF:  The inhaler medicine does not help your wheezing or shortness of breath or you have tightness in your chest.  You have dizziness, headaches, or fast heart rate.  You have chills, fever, or night sweats.  You have a large increase of thick spit, or your thick spit is bloody. MAKE SURE YOU:   Understand these instructions.  Will watch your condition.  Will get help right away if you are not doing well or get worse.   This information is not intended to replace advice given to you by your health care provider. Make sure you discuss any questions you have with your health care provider.   Document Released: 07/04/2008 Document Revised: 07/16/2013 Document Reviewed:  04/24/2013 Elsevier Interactive Patient Education 2016 Else[employer] Inc.  Upper Respiratory Infection, Adult For drainage recommend taking the Zyrtec prescription which is a liquid. Take daily. The drainage is causing your throat to be quite sore and irritated. May use Cepacol lozenges to help with sore throat pain. In addition ibuprofen helps with sore throat pain. Continue to drink plenty of fluids and stay well-hydrated Most upper respiratory infections (URIs) are caused by a virus. A URI affects the nose, throat, and upper air passages. The most common type of URI is often called "the common cold." HOME CARE   Take medicines only as told by your doctor.  Gargle warm saltwater or take cough drops to comfort your throat as told by your doctor.  Use a warm mist humidifier or  inhale steam from a shower to increase air moisture. This may make it easier to breathe.  Drink enough fluid to keep your pee (urine) clear or pale yellow.  Eat soups and other clear broths.  Have a healthy diet.  Rest as needed.  Go back to work when your fever is gone or your doctor says it is okay.  You may need to stay home longer to avoid giving your URI to others.  You can also wear a face mask and wash your hands often to prevent spread of the virus.  Use your inhaler more if you have asthma.  Do not use any tobacco products, including cigarettes, chewing tobacco, or electronic cigarettes. If you need help quitting, ask your doctor. GET HELP IF:  You are getting worse, not better.  Your symptoms are not helped by medicine.  You have chills.  You are getting more short of breath.  You have brown or red mucus.  You have yellow or brown discharge from your nose.  You have pain in your face, especially when you bend forward.  You have a fever.  You have puffy (swollen) neck glands.  You have pain while swallowing.  You have white areas in the back of your throat. GET HELP RIGHT AWAY IF:    You have very bad or constant:  Headache.  Ear pain.  Pain in your forehead, behind your eyes, and over your cheekbones (sinus pain).  Chest pain.  You have long-lasting (chronic) lung disease and any of the following:  Wheezing.  Long-lasting cough.  Coughing up blood.  A change in your usual mucus.  You have a stiff neck.  You have changes in your:  Vision.  Hearing.  Thinking.  Mood. MAKE SURE YOU:   Understand these instructions.  Will watch your condition.  Will get help right away if you are not doing well or get worse.   This information is not intended to replace advice given to you by your health care provider. Make sure you discuss any questions you have with your health care provider.   Document Released: 03/13/2008 Document Revised: 02/09/2015 Document Reviewed: 12/31/2013 Elsevier Interactive Patient Education [employer]

## 2015-12-03 ENCOUNTER — Emergency Department (HOSPITAL_COMMUNITY): Payer: Medicaid Other

## 2015-12-03 ENCOUNTER — Emergency Department (HOSPITAL_COMMUNITY)
Admission: EM | Admit: 2015-12-03 | Discharge: 2015-12-03 | Disposition: A | Discharge status: Home / Self Care | Payer: Medicaid Other | Attending: Emergency Medicine | Admitting: Emergency Medicine

## 2015-12-03 ENCOUNTER — Encounter (HOSPITAL_COMMUNITY): Payer: Self-pay | Admitting: *Deleted

## 2015-12-03 DIAGNOSIS — W1830XA Fall on same level, unspecified, initial encounter: Secondary | ICD-10-CM | POA: Insufficient documentation

## 2015-12-03 DIAGNOSIS — Z79899 Other long term (current) drug therapy: Secondary | ICD-10-CM | POA: Diagnosis not present

## 2015-12-03 DIAGNOSIS — Y999 Unspecified external cause status: Secondary | ICD-10-CM | POA: Insufficient documentation

## 2015-12-03 DIAGNOSIS — S6992XA Unspecified injury of left wrist, hand and finger(s), initial encounter: Secondary | ICD-10-CM | POA: Diagnosis present

## 2015-12-03 DIAGNOSIS — Z7952 Long term (current) use of systemic steroids: Secondary | ICD-10-CM | POA: Insufficient documentation

## 2015-12-03 DIAGNOSIS — Y9361 Activity, american tackle football: Secondary | ICD-10-CM | POA: Insufficient documentation

## 2015-12-03 DIAGNOSIS — Y92321 Football field as the place of occurrence of the external cause: Secondary | ICD-10-CM | POA: Diagnosis not present

## 2015-12-03 DIAGNOSIS — M25532 Pain in left wrist: Secondary | ICD-10-CM

## 2015-12-03 MED ORDER — IBUPROFEN 800 MG PO TABS: 800.0000 mg | ORAL_TABLET | Freq: Three times a day (TID) | ORAL | Status: DC

## 2015-12-03 MED ORDER — OXYCODONE-ACETAMINOPHEN 5-325 MG PO TABS
1.0000 | ORAL_TABLET | Freq: Once | ORAL | Status: AC
  Administered 2015-12-03: 1 via ORAL

## 2015-12-03 MED ORDER — OXYCODONE-ACETAMINOPHEN 5-325 MG PO TABS
ORAL_TABLET | ORAL | Status: AC
  Filled 2015-12-03: qty 1

## 2015-12-03 MED ORDER — IBUPROFEN 400 MG PO TABS
800.0000 mg | ORAL_TABLET | Freq: Once | ORAL | Status: AC
  Administered 2015-12-03: 800 mg via ORAL
  Filled 2015-12-03: qty 2

## 2015-12-03 MED ORDER — HYDROCODONE-ACETAMINOPHEN 5-325 MG PO TABS: 1.0000 | ORAL_TABLET | ORAL | Status: DC | PRN

## 2015-12-03 NOTE — ED Notes (Signed)
Ortho tech in to apply splint. 

## 2015-12-03 NOTE — ED Provider Notes (Signed)
CSN: [id number]     Arrival date & time 12/03/15  0709 History  By signing my name below, I, Nancy Young, attest that this documentation has been prepared under the direction and in the presence of Nancy Fowler, PA-C Electronically Signed: Charline Bills, ED Scribe 12/03/2015 at 9:44 AM.   Chief Complaint  Patient presents with  . Wrist Pain   The history is provided by the patient. No language interpreter was used.   HPI Comments: Nancy Young is a 30 y.o. female who presents to the Emergency Department complaining of a left wrist injury sustained yesterday. Pt states that she fell on an out-stretched hand yesterday while playing football. She reports a constant, moderate, throbbing sensation in her left wrist that is exacerbated with movement. No medications tried PTA. She denies numbness/tingling.   History reviewed. No pertinent past medical history. History reviewed. No pertinent past surgical history. Family History  Problem Relation Age of Onset  . Diabetes Mother    Social History  Substance Use Topics  . Smoking status: Never Smoker   . Smokeless tobacco: None  . Alcohol Use: Yes     Comment: occasional   OB History    No data available     Review of Systems  Musculoskeletal: Positive for arthralgias.  Skin: Negative for color change and wound.  Neurological: Negative for weakness and numbness.  All other systems reviewed and are negative.  Allergies  Review of patient's allergies indicates no known allergies.  Home Medications   Prior to Admission medications   Medication Sig Start Date End Date Taking? Authorizing Provider  acetaminophen (TYLENOL) 325 MG tablet Take 1 tablet (325 mg total) by mouth every 6 (six) hours as needed for moderate pain. 01/01/15   Marissa Sciacca, PA-C  albuterol (PROVENTIL HFA;VENTOLIN HFA) 108 (90 Base) MCG/ACT inhaler Inhale 2 puffs into the lungs every 4 (four) hours as needed for wheezing or shortness of breath. 10/21/15    Hayden Rasmussen, NP  cetirizine HCl (ZYRTEC) 5 MG/5ML SYRP Take 10 ml po daily for drainage. 10/21/15   Hayden Rasmussen, NP  HYDROcodone-acetaminophen (NORCO/VICODIN) 5-325 MG tablet Take 1 tablet by mouth every 4 (four) hours as needed. 12/03/15   Nancy Fowler, PA-C  ibuprofen (ADVIL,MOTRIN) 800 MG tablet Take 1 tablet (800 mg total) by mouth 3 (three) times daily. 12/03/15   Nancy Fowler, PA-C  predniSONE (DELTASONE) 20 MG tablet Take 3 tabs po on first day, 2 tabs second day, 2 tabs third day, 1 tab fourth day, 1 tab 5th day. Take with food. 10/21/15   Hayden Rasmussen, NP   BP 127/83 mmHg  Pulse 88  Temp(Src) 98.2 F (36.8 C) (Oral)  Resp 18  Ht  (1.6 m)  Wt 201 lb (91.173 kg)  BMI 35.61 kg/m2  SpO2 98%  LMP 11/04/2015 Physical Exam  Constitutional: She is oriented to person, place, and time. She appears well-developed and well-nourished.  HENT:  Head: Normocephalic and atraumatic.  Right Ear: External ear normal.  Left Ear: External ear normal.  Eyes: Conjunctivae are normal. No scleral icterus.  Neck: No tracheal deviation present.  Cardiovascular:  Capillary refill less than 3 seconds.   Pulmonary/Chest: Effort normal. No respiratory distress.  Abdominal: She exhibits no distension.  Musculoskeletal: Normal range of motion. She exhibits tenderness.       Left wrist: She exhibits tenderness and bony tenderness. She exhibits normal range of motion, no swelling, no crepitus and no deformity.  Left wrist TTP with tenderness over anatomical  snuffbox and lateral dorsal wrist.  No ecchymosis, bruising, or erythema.  Slightly decreased ROM secondary to pain.  Compartments are soft and compressible.   Neurological: She is alert and oriented to person, place, and time.  Strength and sensation intact distal to injury.   Skin: Skin is warm and dry.  Psychiatric: She has a normal mood and affect. Her behavior is normal.   ED Course  Procedures (including critical care time) DIAGNOSTIC STUDIES: Oxygen  Saturation is 98% on RA, normal by my interpretation.    COORDINATION OF CARE: 9:15 AM-Discussed treatment plan which includes XR, splint, Percocet and ibuprofen with pt at bedside and pt agreed to plan.   Labs Review Labs Reviewed - No data to display  Imaging Review Dg Wrist Complete Left  12/03/2015  CLINICAL DATA:  Wrist pain after playing football yesterday. Initial encounter. EXAM: LEFT WRIST - COMPLETE 3+ VIEW COMPARISON:  None. FINDINGS: The lateral radiograph is degraded due to obliquity. No fracture or dislocation. Joint spaces are preserved. No erosions. No evidence of chondrocalcinosis. Regional soft tissues appear normal. No radiopaque foreign body. IMPRESSION: No fracture. If the patient has pain referable to the anatomic snuff box, splinting and a follow-up radiograph in 10 to 14 days is recommended to evaluate for occult scaphoid fracture. Electronically Signed   By: Simonne Come M.D.   On: 12/03/2015 07:55   I have personally reviewed and evaluated these images and lab results as part of my medical decision-making.   EKG Interpretation None      MDM   Final diagnoses:  Left wrist pain    Patient X-Ray negative for obvious fracture or dislocation. NVI distal to injury.  Comparments are soft and compressible. Pain managed in ED. Pt advised to follow up with orthopedicsin 10-14 days for repeat xrays for possibility of missed fracture diagnosis. Patient given brace while in ED and sling, conservative therapy recommended and discussed. Patient will be dc home & is agreeable with above plan.  I personally performed the services described in this documentation, which was scribed in my presence. The recorded information has been reviewed and is accurate.    Nancy Fowler, PA-C 12/03/15 [id number]  Pricilla Loveless, MD 12/04/15 [phone]

## 2015-12-03 NOTE — Progress Notes (Signed)
Orthopedic Tech Progress Note Patient Details:  Nancy Young Aug 21, 1986 [id number]  Ortho Devices Type of Ortho Device: Ace wrap, Arm sling, Sugartong splint Ortho Device/Splint Location: lue Ortho Device/Splint Interventions: Application   Nancy Young 12/03/2015, 9:46 AM

## 2015-12-03 NOTE — ED Notes (Signed)
Pt reports injuring left wrist yesterday while playing football and now having severe pain. +radial pulse, able to move digits.

## 2015-12-03 NOTE — ED Notes (Signed)
Pt states she was playing football last pm, fell on left wrist. C/o pain.

## 2015-12-03 NOTE — Discharge Instructions (Signed)
Wrist Pain There are many things that can cause wrist pain. Some common causes include:  An injury to the wrist area, such as a sprain, strain, or fracture.  Overuse of the joint.  A condition that causes increased pressure on a nerve in the wrist (carpal tunnel syndrome).  Wear and tear of the joints that occurs with aging (osteoarthritis).  A variety of other types of arthritis. Sometimes, the cause of wrist pain is not known. The pain often goes away when you follow your health care provider's instructions for relieving pain at home. If your wrist pain continues, tests may need to be done to diagnose your condition. HOME CARE INSTRUCTIONS Pay attention to any changes in your symptoms. Take these actions to help with your pain:  Rest the wrist area for at least 48 hours or as told by your health care provider.  If directed, apply ice to the injured area:  Put ice in a plastic bag.  Place a towel between your skin and the bag.  Leave the ice on for 20 minutes, 2-3 times per day.  Keep your arm raised (elevated) above the level of your heart while you are sitting or lying down.  If a splint or elastic bandage has been applied, use it as told by your health care provider.  Remove the splint or bandage only as told by your health care provider.  Loosen the splint or bandage if your fingers become numb or have a tingling feeling, or if they turn cold or blue.  Take over-the-counter and prescription medicines only as told by your health care provider.  Keep all follow-up visits as told by your health care provider. This is important. SEEK MEDICAL CARE IF:  Your pain is not helped by treatment.  Your pain gets worse. SEEK IMMEDIATE MEDICAL CARE IF:  Your fingers become swollen.  Your fingers turn white, very red, or cold and blue.  Your fingers are numb or have a tingling feeling.  You have difficulty moving your fingers.   This information is not intended to replace  advice given to you by your health care provider. Make sure you discuss any questions you have with your health care provider.   Document Released: 07/05/2005 Document Revised: 06/16/2015 Document Reviewed: 02/10/2015 Elsevier Interactive Patient Education 2016 [employer]  

## 2015-12-03 NOTE — ED Notes (Signed)
Ortho Tech notified  

## 2016-01-17 ENCOUNTER — Encounter (HOSPITAL_COMMUNITY): Payer: Self-pay | Admitting: Emergency Medicine

## 2016-01-17 ENCOUNTER — Emergency Department (HOSPITAL_COMMUNITY)
Admission: EM | Admit: 2016-01-17 | Discharge: 2016-01-17 | Disposition: A | Discharge status: Home / Self Care | Payer: Medicaid Other | Attending: Emergency Medicine | Admitting: Emergency Medicine

## 2016-01-17 DIAGNOSIS — Z3202 Encounter for pregnancy test, result negative: Secondary | ICD-10-CM | POA: Insufficient documentation

## 2016-01-17 DIAGNOSIS — M791 Myalgia: Secondary | ICD-10-CM | POA: Diagnosis not present

## 2016-01-17 DIAGNOSIS — B349 Viral infection, unspecified: Secondary | ICD-10-CM | POA: Insufficient documentation

## 2016-01-17 DIAGNOSIS — Z79899 Other long term (current) drug therapy: Secondary | ICD-10-CM | POA: Diagnosis not present

## 2016-01-17 DIAGNOSIS — R51 Headache: Secondary | ICD-10-CM | POA: Diagnosis present

## 2016-01-17 DIAGNOSIS — R112 Nausea with vomiting, unspecified: Secondary | ICD-10-CM

## 2016-01-17 LAB — COMPREHENSIVE METABOLIC PANEL
ALK PHOS: 78 U/L (ref 38–126)
ALT: 23 U/L (ref 14–54)
ANION GAP: 12 (ref 5–15)
AST: 27 U/L (ref 15–41)
Albumin: 4.2 g/dL (ref 3.5–5.0)
BUN: 11 mg/dL (ref 6–20)
CALCIUM: 9.6 mg/dL (ref 8.9–10.3)
CHLORIDE: 107 mmol/L (ref 101–111)
CO2: 24 mmol/L (ref 22–32)
CREATININE: 0.68 mg/dL (ref 0.44–1.00)
Glucose, Bld: 94 mg/dL (ref 65–99)
Potassium: 3.8 mmol/L (ref 3.5–5.1)
Sodium: 143 mmol/L (ref 135–145)
Total Bilirubin: 0.9 mg/dL (ref 0.3–1.2)
Total Protein: 7.7 g/dL (ref 6.5–8.1)

## 2016-01-17 LAB — CBC
HCT: 39.3 % (ref 36.0–46.0)
HEMOGLOBIN: 13 g/dL (ref 12.0–15.0)
MCH: 29.5 pg (ref 26.0–34.0)
MCHC: 33.1 g/dL (ref 30.0–36.0)
MCV: 89.1 fL (ref 78.0–100.0)
PLATELETS: 297 10*3/uL (ref 150–400)
RBC: 4.41 MIL/uL (ref 3.87–5.11)
RDW: 13.5 % (ref 11.5–15.5)
WBC: 5.5 10*3/uL (ref 4.0–10.5)

## 2016-01-17 LAB — URINALYSIS, ROUTINE W REFLEX MICROSCOPIC
Glucose, UA: NEGATIVE mg/dL
Hgb urine dipstick: NEGATIVE
Ketones, ur: 15 mg/dL — AB
LEUKOCYTES UA: NEGATIVE
Nitrite: NEGATIVE
PROTEIN: NEGATIVE mg/dL
Specific Gravity, Urine: 1.031 — ABNORMAL HIGH (ref 1.005–1.030)
pH: 5.5 (ref 5.0–8.0)

## 2016-01-17 LAB — LIPASE, BLOOD: LIPASE: 27 U/L (ref 11–51)

## 2016-01-17 LAB — I-STAT BETA HCG BLOOD, ED (MC, WL, AP ONLY)

## 2016-01-17 MED ORDER — ONDANSETRON HCL 4 MG PO TABS: 4.0000 mg | ORAL_TABLET | Freq: Four times a day (QID) | ORAL | Status: DC

## 2016-01-17 MED ORDER — IBUPROFEN 200 MG PO TABS
600.0000 mg | ORAL_TABLET | Freq: Once | ORAL | Status: AC
  Administered 2016-01-17: 600 mg via ORAL
  Filled 2016-01-17: qty 3

## 2016-01-17 MED ORDER — SODIUM CHLORIDE 0.9 % IV BOLUS (SEPSIS)
1000.0000 mL | Freq: Once | INTRAVENOUS | Status: AC
  Administered 2016-01-17: 1000 mL via INTRAVENOUS

## 2016-01-17 NOTE — ED Notes (Signed)
Pt sts HA and body aches with chills and fever; pt sts N/V x 2 days

## 2016-01-17 NOTE — Discharge Instructions (Signed)
Medications: Zofran  Treatment: Take Zofran every 6 hours as needed for nausea and vomiting. Drink plenty of fluids and get rest. You can take ibuprofen for any headache that returns.  Follow-up: If your symptoms are not improving in the next 2-3 days, please see a doctor. Please return to emergency department if you are not able to tolerate fluids, develop fever, or any other new or concerning symptoms.   Nausea and Vomiting Nausea is a sick feeling that often comes before throwing up (vomiting). Vomiting is a reflex where stomach contents come out of your mouth. Vomiting can cause severe loss of body fluids (dehydration). Children and elderly adults can become dehydrated quickly, especially if they also have diarrhea. Nausea and vomiting are symptoms of a condition or disease. It is important to find the cause of your symptoms. CAUSES   Direct irritation of the stomach lining. This irritation can result from increased acid production (gastroesophageal reflux disease), infection, food poisoning, taking certain medicines (such as nonsteroidal anti-inflammatory drugs), alcohol use, or tobacco use.  Signals from the brain.These signals could be caused by a headache, heat exposure, an inner ear disturbance, increased pressure in the brain from injury, infection, a tumor, or a concussion, pain, emotional stimulus, or metabolic problems.  An obstruction in the gastrointestinal tract (bowel obstruction).  Illnesses such as diabetes, hepatitis, gallbladder problems, appendicitis, kidney problems, cancer, sepsis, atypical symptoms of a heart attack, or eating disorders.  Medical treatments such as chemotherapy and radiation.  Receiving medicine that makes you sleep (general anesthetic) during surgery. DIAGNOSIS Your caregiver may ask for tests to be done if the problems do not improve after a few days. Tests may also be done if symptoms are severe or if the reason for the nausea and vomiting is not  clear. Tests may include:  Urine tests.  Blood tests.  Stool tests.  Cultures (to look for evidence of infection).  X-rays or other imaging studies. Test results can help your caregiver make decisions about treatment or the need for additional tests. TREATMENT You need to stay well hydrated. Drink frequently but in small amounts.You may wish to drink water, sports drinks, clear broth, or eat frozen ice pops or gelatin dessert to help stay hydrated.When you eat, eating slowly may help prevent nausea.There are also some antinausea medicines that may help prevent nausea. HOME CARE INSTRUCTIONS   Take all medicine as directed by your caregiver.  If you do not have an appetite, do not force yourself to eat. However, you must continue to drink fluids.  If you have an appetite, eat a normal diet unless your caregiver tells you differently.  Eat a variety of complex carbohydrates (rice, wheat, potatoes, bread), lean meats, yogurt, fruits, and vegetables.  Avoid high-fat foods because they are more difficult to digest.  Drink enough water and fluids to keep your urine clear or pale yellow.  If you are dehydrated, ask your caregiver for specific rehydration instructions. Signs of dehydration may include:  Severe thirst.  Dry lips and mouth.  Dizziness.  Dark urine.  Decreasing urine frequency and amount.  Confusion.  Rapid breathing or pulse. SEEK IMMEDIATE MEDICAL CARE IF:   You have blood or brown flecks (like coffee grounds) in your vomit.  You have black or bloody stools.  You have a severe headache or stiff neck.  You are confused.  You have severe abdominal pain.  You have chest pain or trouble breathing.  You do not urinate at least once every 8 hours.  You develop cold or clammy skin.  You continue to vomit for longer than 24 to 48 hours.  You have a fever. MAKE SURE YOU:   Understand these instructions.  Will watch your condition.  Will get help  right away if you are not doing well or get worse.   This information is not intended to replace advice given to you by your health care provider. Make sure you discuss any questions you have with your health care provider.   Document Released: 09/25/2005 Document Revised: 12/18/2011 Document Reviewed: 02/22/2011 Elsevier Interactive Patient Education [employer]

## 2016-01-17 NOTE — ED Notes (Signed)
RN attempt IV x 2 unsucceful-2nd RN to attempt.

## 2016-01-17 NOTE — ED Provider Notes (Signed)
CSN: [id number]     Arrival date & time 01/17/16  [id number] History   First MD Initiated Contact with Patient 01/17/16 [phone]     Chief Complaint  Patient presents with  . Headache  . Generalized Body Aches     (Consider location/radiation/quality/duration/timing/severity/associated sxs/prior Treatment) HPI Comments: Patient is a previously healthy 30 year old female who presents with a 2 day history of fever, body aches, headache and 1 day history of nausea and vomiting. Patient has had no appetite and vomited entire meal that she tried to eat last evening. Patient has not been able to keep fluids down. Patient has taken Tylenol at home which helped fever. Patient also took Motrin 800 last evening around 7 PM. Patient has had some associated dizziness when laying down. Patient denies chest pain, shortness of breath, abdominal pain, dysuria.  Patient is a 30 y.o. female presenting with headaches. The history is provided by the patient.  Headache Associated symptoms: fever, myalgias, nausea and vomiting   Associated symptoms: no abdominal pain, no back pain, no cough and no sore throat     History reviewed. No pertinent past medical history. History reviewed. No pertinent past surgical history. Family History  Problem Relation Age of Onset  . Diabetes Mother    Social History  Substance Use Topics  . Smoking status: Never Smoker   . Smokeless tobacco: None  . Alcohol Use: Yes     Comment: occasional   OB History    No data available     Review of Systems  Constitutional: Positive for fever and chills.  HENT: Negative for facial swelling and sore throat.   Respiratory: Negative for cough and shortness of breath.   Cardiovascular: Negative for chest pain.  Gastrointestinal: Positive for nausea and vomiting. Negative for abdominal pain.  Genitourinary: Negative for dysuria.  Musculoskeletal: Positive for myalgias. Negative for back pain.  Skin: Negative for rash and wound.   Neurological: Positive for headaches.  Psychiatric/Behavioral: The patient is not nervous/anxious.       Allergies  Review of patient's allergies indicates no known allergies.  Home Medications   Prior to Admission medications   Medication Sig Start Date End Date Taking? Authorizing Provider  albuterol (PROVENTIL HFA;VENTOLIN HFA) 108 (90 Base) MCG/ACT inhaler Inhale 2 puffs into the lungs every 4 (four) hours as needed for wheezing or shortness of breath. 10/21/15   Hayden Rasmussenavid Mabe, NP  cetirizine HCl (ZYRTEC) 5 MG/5ML SYRP Take 10 ml po daily for drainage. Patient not taking: Reported on 01/17/2016 10/21/15   Hayden Rasmussenavid Mabe, NP  HYDROcodone-acetaminophen (NORCO/VICODIN) 5-325 MG tablet Take 1 tablet by mouth every 4 (four) hours as needed. Patient not taking: Reported on 01/17/2016 12/03/15   Cheri FowlerKayla Rose, PA-C  ibuprofen (ADVIL,MOTRIN) 800 MG tablet Take 1 tablet (800 mg total) by mouth 3 (three) times daily. Patient not taking: Reported on 01/17/2016 12/03/15   Cheri FowlerKayla Rose, PA-C  ondansetron (ZOFRAN) 4 MG tablet Take 1 tablet (4 mg total) by mouth every 6 (six) hours. 01/17/16   Nicola Heinemann M Larysa Pall, PA-C   BP 123/75 mmHg  Pulse 74  Temp(Src) 97.9 F (36.6 C) (Oral)  Resp 18  SpO2 100% Physical Exam  Constitutional: She appears well-developed and well-nourished. No distress.  HENT:  Head: Normocephalic and atraumatic.  Mouth/Throat: Mucous membranes are not dry. No oropharyngeal exudate.  Eyes: Conjunctivae are normal. Pupils are equal, round, and reactive to light. Right eye exhibits no discharge. Left eye exhibits no discharge. No scleral icterus.  Neck: Normal range  of motion. Neck supple. No thyromegaly present.  Cardiovascular: Normal rate, regular rhythm, normal heart sounds and intact distal pulses.  Exam reveals no gallop and no friction rub.   No murmur heard. Pulmonary/Chest: Effort normal and breath sounds normal. No stridor. No respiratory distress. She has no wheezes. She has no rales.   Abdominal: Soft. Bowel sounds are normal. She exhibits no distension. There is no tenderness. There is no rebound and no guarding.  Musculoskeletal: She exhibits no edema.  Lymphadenopathy:    She has no cervical adenopathy.  Neurological: She is alert. Coordination normal.  Skin: Skin is warm and dry. No rash noted. She is not diaphoretic. No pallor.  Psychiatric: She has a normal mood and affect.  Nursing note and vitals reviewed.   ED Course  Procedures (including critical care time) Labs Review Labs Reviewed  URINALYSIS, ROUTINE W REFLEX MICROSCOPIC (NOT AT [facility]) - Abnormal; Notable for the following:    Color, Urine AMBER (*)    APPearance CLOUDY (*)    Specific Gravity, Urine 1.031 (*)    Bilirubin Urine SMALL (*)    Ketones, ur 15 (*)    All other components within normal limits  LIPASE, BLOOD  COMPREHENSIVE METABOLIC PANEL  CBC  I-STAT BETA HCG BLOOD, ED ([facility], [facility], [facility] ONLY)    Imaging Review No results found. I have personally reviewed and evaluated these images and lab results as part of my medical decision-making.   EKG Interpretation None      MDM   Patient with symptoms consistent with viral gastroenteritis. Urine consistent with dehydration. Otherwise labs unremarkable. Beta hCG negative. Patient given IV fluids, ibuprofen, and Zofran in the ED and is now tolerating PO fluids > 6 oz. Patient's nausea and headache improved during ED course.  Lungs are clear.  No focal abdominal pain, no concern for appendicitis, cholecystitis, pancreatitis, ruptured viscus, UTI, kidney stone, or any other abdominal etiology. Patient discharged with Zofran.  Supportive therapy indicated with return if symptoms worsen. Vitals are stable, no fever at discharge. Patient is in agreement and understands plan. Patient discussed with Dr. Silverio Lay who is in agreement with plan.   Final diagnoses:  Viral illness  Non-intractable vomiting with nausea, vomiting of unspecified type        Emi Holes, PA-C 01/17/16 1718  Richardean Canal, MD 01/17/16 [id number]

## 2016-02-11 ENCOUNTER — Encounter: Payer: Self-pay | Admitting: Podiatry

## 2016-02-11 ENCOUNTER — Ambulatory Visit (INDEPENDENT_AMBULATORY_CARE_PROVIDER_SITE_OTHER): Payer: Medicaid Other | Admitting: Podiatry

## 2016-02-11 VITALS — BP 107/70 | HR 74 | Resp 16

## 2016-02-11 DIAGNOSIS — L600 Ingrowing nail: Secondary | ICD-10-CM | POA: Diagnosis not present

## 2016-02-11 NOTE — Patient Instructions (Signed)

## 2016-02-11 NOTE — Progress Notes (Signed)
   Subjective:    Patient ID: Nancy Young, female    DOB: 06/28/1986, 30 y.o.   MRN: [id number]  HPI 30 year old female presents the office today for concerns of ingrown toenails the left lateral hallux which is been ongoing for several months. She denies any recent redness or drainage but has been swollen and painful with pressure in shoe year. [facility] are supp[facility] without any relief. No other complaints at this time.   Review of Systems  All other systems reviewed and are negative.      Objective:   Physical Exam General: AAO x3, NAD  Dermatological: There is incurvation along the lateral aspect left hallux toenail tenderness to palpation pretty there is localized edema with any significant erythema or increase in warmth. There is no ascending cellulitis. No drainage or pus. No tenderness or pathology other toenails this point.  Vascular: Dorsalis Pedis artery and Posterior Tibial artery pedal pulses are 2/4 bilateral with immedate capillary fill time. Pedal hair growth present. No varicosities and no lower extremity edema present bilateral. There is no pain with calf compression, swelling, warmth, erythema.   Neruologic: Grossly intact via light touch bilateral. Vibratory intact via tuning fork bilateral. Protective threshold with Semmes Wienstein monofilament intact to all pedal sites bilateral. Patellar and Achilles deep tendon reflexes 2+ bilateral. No Babinski or clonus noted bilateral.   Musculoskeletal: No gross boney pedal deformities bilateral. No pain, crepitus, or limitation noted with foot and ankle range of motion bilateral. Muscular strength 5/5 in all groups tested bilateral.  Gait: Unassisted, Nonantalgic.      Assessment & Plan:  30 year old female left lateral hallux symptomatic ingrown toenail -Treatment options discussed including all alternatives, risks, and complications -Etiology of symptoms were discussed -At this time, the patient is  requesting partial nail removal with chemical matricectomy to the symptomatic portion of the nail. Risks and complications were discussed with the patient for which they understand and  verbally consent to the procedure. Under sterile conditions a total of 3 mL of a mixture of 2% lidocaine plain and 0.5% Marcaine plain was infiltrated in a hallux block fashion. Once anesthetized, the skin was prepped in sterile fashion. A tourniquet was then applied. Next the lateral aspect of hallux nail border was then sharply excised making sure to remove the entire offending nail border. Once the nails were ensured to be removed area was debrided and the underlying skin was intact. There is no purulence identified in the procedure. Next phenol was then applied under standard conditions and copiously irrigated. Silvadene was applied. A dry sterile dressing was applied. After application of the dressing the tourniquet was removed and there is found to be an immediate capillary refill time to the digit. The patient tolerated the procedure well any complications. Post procedure instructions were discussed the patient for which he verbally understood. Follow-up in one week for nail check or sooner if any problems are to arise. Discussed signs/symptoms of infection and directed to call the office immediately should any occur or go directly to the emergency room. In the meantime, encouraged to call the office with any questions, concerns, changes symptoms.  Ovid CurdMatthew Wagoner, DPM

## 2016-02-16 DIAGNOSIS — L600 Ingrowing nail: Secondary | ICD-10-CM | POA: Insufficient documentation

## 2016-04-24 ENCOUNTER — Encounter (HOSPITAL_COMMUNITY): Payer: Self-pay | Admitting: Emergency Medicine

## 2016-04-24 ENCOUNTER — Encounter (HOSPITAL_COMMUNITY): Payer: Self-pay

## 2016-04-24 ENCOUNTER — Emergency Department (HOSPITAL_COMMUNITY)
Admission: EM | Admit: 2016-04-24 | Discharge: 2016-04-24 | Disposition: A | Discharge status: Home / Self Care | Payer: Medicaid Other | Attending: Emergency Medicine | Admitting: Emergency Medicine

## 2016-04-24 ENCOUNTER — Ambulatory Visit (HOSPITAL_COMMUNITY)
Admission: EM | Admit: 2016-04-24 | Discharge: 2016-04-24 | Disposition: A | Discharge status: Home / Self Care | Payer: Medicaid Other | Attending: Family Medicine | Admitting: Family Medicine

## 2016-04-24 DIAGNOSIS — L03032 Cellulitis of left toe: Secondary | ICD-10-CM | POA: Diagnosis not present

## 2016-04-24 DIAGNOSIS — Z5321 Procedure and treatment not carried out due to patient leaving prior to being seen by health care provider: Secondary | ICD-10-CM | POA: Insufficient documentation

## 2016-04-24 DIAGNOSIS — M79675 Pain in left toe(s): Secondary | ICD-10-CM

## 2016-04-24 DIAGNOSIS — M79676 Pain in unspecified toe(s): Secondary | ICD-10-CM

## 2016-04-24 MED ORDER — MUPIROCIN 2 % EX OINT: TOPICAL_OINTMENT | CUTANEOUS | Status: DC

## 2016-04-24 MED ORDER — CEPHALEXIN 500 MG PO CAPS: 500.0000 mg | ORAL_CAPSULE | Freq: Three times a day (TID) | ORAL | Status: DC

## 2016-04-24 NOTE — ED Notes (Signed)
Patient called x2, no answer

## 2016-04-24 NOTE — ED Notes (Signed)
patient states that having left toe pain.  Patient states that went to the pool today and when she arrived home noticed the nail is partially missing.  Patient states that the pain tonight woke her from sleep.   Patient rates pain 7/10.

## 2016-04-24 NOTE — ED Notes (Signed)
Patient called from waiting room to treatment room with no answer.

## 2016-04-24 NOTE — ED Provider Notes (Signed)
CSN: [id number]     Arrival date & time 04/24/16  1715 History   First MD Initiated Contact with Patient 04/24/16 1815     Chief Complaint  Patient presents with  . Nail Problem   (Consider location/radiation/quality/duration/timing/severity/associated sxs/prior Treatment) HPI  Nancy Young is a 30 y.o. female presenting to UC with c/o gradually worsening Left great toe pain since having ingrown toe removed in May.  She notes she was in the pool yesterday and her nail fell off. Denies known injury. Pain is aching and throbbing, worse with ambulation and palpation. She has not taken any medication PTA.  She has been soaking in Epson salt. Denies fever or chills. No bleeding or discharge from her toe.   History reviewed. No pertinent past medical history. History reviewed. No pertinent past surgical history. Family History  Problem Relation Age of Onset  . Diabetes Mother    Social History  Substance Use Topics  . Smoking status: Never Smoker   . Smokeless tobacco: None  . Alcohol Use: Yes     Comment: occasional   OB History    No data available     Review of Systems  Constitutional: Negative for fever and chills.  Musculoskeletal: Positive for myalgias and arthralgias. Negative for joint swelling.       Left great toe  Skin: Positive for color change and wound. Negative for rash.  Neurological: Negative for weakness and numbness.    Allergies  Review of patient's allergies indicates no known allergies.  Home Medications   Prior to Admission medications   Medication Sig Start Date End Date Taking? Authorizing Provider  cephALEXin (KEFLEX) 500 MG capsule Take 1 capsule (500 mg total) by mouth 3 (three) times daily. For 7 days 04/24/16   Junius Finner, PA-C  mupirocin ointment (BACTROBAN) 2 % Apply to Left big toe 3 times daily for 5 days 04/24/16   Junius Finner, PA-C   Meds Ordered and Administered this Visit  Medications - No data to display  BP 119/79 mmHg   Pulse 82  Temp(Src) 98.5 F (36.9 C) (Oral)  Resp 18  SpO2 100%  LMP 04/21/2016 (Exact Date) No data found.   Physical Exam  Constitutional: She is oriented to person, place, and time. She appears well-developed and well-nourished.  HENT:  Head: Normocephalic and atraumatic.  Eyes: EOM are normal.  Neck: Normal range of motion.  Cardiovascular: Normal rate.   Left great toe: cap refill < 3 seconds  Pulmonary/Chest: Effort normal.  Musculoskeletal: Normal range of motion. She exhibits edema and tenderness.  Left great toe: mild edema, diffuse tenderness. Full ROM.  Neurological: She is alert and oriented to person, place, and time.  Skin: Skin is warm and dry. There is erythema.  Left great toe: skin in tact, erythematous. Thin short nail overtop nailbed c/w new growth. No bleeding or discharge. Tender.   Psychiatric: She has a normal mood and affect. Her behavior is normal.  Nursing note and vitals reviewed.   ED Course  Procedures (including critical care time)  Labs Review Labs Reviewed - No data to display  Imaging Review No results found.   MDM   1. Cellulitis of great toe, left   2. Great toe pain, left    Pt c/o Left great toe pain. She notes her nail fell off, however, there is a short thin nail present on Left great toe. Erythema, mild edema and tenderness. Will tx for cellulitis.  Rx: keflex and mupirocin.  F/u with  foot center if not improving in 1 week, sooner if worsening.    Junius Finnerrin O'Malley, PA-C 04/24/16 1924

## 2016-04-24 NOTE — ED Provider Notes (Signed)
Patient left without being seen after triage, did not participate in the care of this patient.  Wynetta Emeryicole Janalynn Eder, PA-C 04/24/16 0941  Lorre NickAnthony Allen, MD 04/26/16 [phone]

## 2016-04-24 NOTE — ED Notes (Signed)
Pt reports left greater toe nail pain... Reports she had a ingrown toe nailed removed from left greater toe back in May 2016 Reports she was at the pool yest and half of her nail fell off... Sx also include pain Steady gait.... A&O x4... NAD

## 2016-04-24 NOTE — ED Notes (Signed)
Patient called x 3 from waiting room, no answer.  

## 2016-09-25 ENCOUNTER — Other Ambulatory Visit: Payer: Self-pay | Admitting: Family Medicine

## 2016-09-25 DIAGNOSIS — R52 Pain, unspecified: Secondary | ICD-10-CM

## 2016-10-13 ENCOUNTER — Ambulatory Visit (INDEPENDENT_AMBULATORY_CARE_PROVIDER_SITE_OTHER): Payer: Medicaid Other | Admitting: Podiatry

## 2016-10-13 ENCOUNTER — Encounter: Payer: Self-pay | Admitting: Podiatry

## 2016-10-13 VITALS — BP 120/89 | HR 93 | Resp 18

## 2016-10-13 DIAGNOSIS — M79674 Pain in right toe(s): Secondary | ICD-10-CM | POA: Diagnosis not present

## 2016-10-13 DIAGNOSIS — L600 Ingrowing nail: Secondary | ICD-10-CM

## 2016-10-13 MED ORDER — CEPHALEXIN 500 MG PO CAPS: 500.0000 mg | ORAL_CAPSULE | Freq: Three times a day (TID) | ORAL | 0 refills | Status: DC

## 2016-10-13 NOTE — Progress Notes (Signed)
Subjective: 31 year old female presents the office with concerns of pain to the last several weeks. She points to the lateral nail border of the right hallux along the proximal aspect where she is the majority of pain. She has noticed swelling to this area but denies any redness or warmth or any drainage. She has been soaking in Epson salts that any relief. Denies any systemic complaints such as fevers, chills, nausea, vomiting. No acute changes since last appointment, and no other complaints at this time.   Objective: AAO x3, NAD DP/PT pulses palpable bilaterally, CRT less than 3 seconds There is tenderness along the lateral hallux nail border on the right hallux. The majority of symptoms are along the proximal nail border. There is localized edema without any erythema or increase in warmth. No drainage or pus. No pain along the medial nail border. The left hallux toenail is healed and doing well.  No open lesions or pre-ulcerative lesions.  No pain with calf compression, swelling, warmth, erythema  Assessment: Right lateral hallux symptomatic ingrown toenail  Plan: -All treatment options discussed with the patient including all alternatives, risks, complications.  -At this time, the patient is requesting partial nail removal with chemical matricectomy to the symptomatic portion of the nail. Risks and complications were discussed with the patient for which they understand and  verbally consent to the procedure. Under sterile conditions a total of 3 mL of a mixture of 2% lidocaine plain and 0.5% Marcaine plain was infiltrated in a hallux block fashion. Once anesthetized, the skin was prepped in sterile fashion. A tourniquet was then applied. Next the lateral aspect of hallux nail border was then sharply excised making sure to remove the entire offending nail border. There was a large amount of callus type tissue within the nail border. Once the nails were ensured to be removed area was debrided and  the underlying skin was intact. There is no purulence identified in the procedure. Next phenol was then applied under standard conditions and copiously irrigated. Silvadene was applied. A dry sterile dressing was applied. After application of the dressing the tourniquet was removed and there is found to be an immediate capillary refill time to the digit. The patient tolerated the procedure well any complications. Post procedure instructions were discussed the patient for which he verbally understood. Follow-up in one week for nail check or sooner if any problems are to arise. Discussed signs/symptoms of infection and directed to call the office immediately should any occur or go directly to the emergency room. In the meantime, encouraged to call the office with any questions, concerns, changes symptoms. -Keflex -Patient encouraged to call the office with any questions, concerns, change in symptoms.   Ovid CurdMatthew Dorlis Judice, DPM

## 2016-10-13 NOTE — Patient Instructions (Signed)

## 2016-10-27 ENCOUNTER — Ambulatory Visit: Payer: Medicaid Other | Admitting: Podiatry

## 2016-11-16 ENCOUNTER — Encounter (HOSPITAL_COMMUNITY): Payer: Self-pay | Admitting: Emergency Medicine

## 2016-11-16 ENCOUNTER — Ambulatory Visit (HOSPITAL_COMMUNITY)
Admission: EM | Admit: 2016-11-16 | Discharge: 2016-11-16 | Disposition: A | Discharge status: Home / Self Care | Payer: Medicaid Other | Attending: Emergency Medicine | Admitting: Emergency Medicine

## 2016-11-16 DIAGNOSIS — R05 Cough: Secondary | ICD-10-CM

## 2016-11-16 DIAGNOSIS — J40 Bronchitis, not specified as acute or chronic: Secondary | ICD-10-CM | POA: Diagnosis not present

## 2016-11-16 DIAGNOSIS — R059 Cough, unspecified: Secondary | ICD-10-CM

## 2016-11-16 DIAGNOSIS — G44209 Tension-type headache, unspecified, not intractable: Secondary | ICD-10-CM

## 2016-11-16 DIAGNOSIS — R062 Wheezing: Secondary | ICD-10-CM

## 2016-11-16 MED ORDER — METHYLPREDNISOLONE 4 MG PO TBPK: ORAL_TABLET | ORAL | 0 refills | Status: DC

## 2016-11-16 MED ORDER — BENZONATATE 100 MG PO CAPS: 200.0000 mg | ORAL_CAPSULE | Freq: Three times a day (TID) | ORAL | 0 refills | Status: DC | PRN

## 2016-11-16 MED ORDER — KETOROLAC TROMETHAMINE 60 MG/2ML IM SOLN
60.0000 mg | Freq: Once | INTRAMUSCULAR | Status: AC
Start: 2016-11-16 — End: 2016-11-16
  Administered 2016-11-16: 60 mg via INTRAMUSCULAR

## 2016-11-16 MED ORDER — NAPROXEN 500 MG PO TABS: ORAL_TABLET | ORAL | 0 refills | Status: DC

## 2016-11-16 MED ORDER — AZITHROMYCIN 250 MG PO TABS: 250.0000 mg | ORAL_TABLET | Freq: Every day | ORAL | 0 refills | Status: DC

## 2016-11-16 MED ORDER — KETOROLAC TROMETHAMINE 60 MG/2ML IM SOLN
INTRAMUSCULAR | Status: AC
  Filled 2016-11-16: qty 2

## 2016-11-16 NOTE — ED Provider Notes (Signed)
CSN: [id number]     Arrival date & time 11/16/16  1219 History   None    Chief Complaint  Patient presents with  . URI   (Consider location/radiation/quality/duration/timing/severity/associated sxs/prior Treatment) Patient c/o severe headache that has tension qualities.  She c/o persistent cough that is worse at HS.  She c/o wheezing and SOB.  She states her albuterol MDI is not working.   The history is provided by the patient.  Cough  Cough characteristics:  Non-productive and productive Sputum characteristics:  White Severity:  Severe Onset quality:  Gradual Duration:  1 week Timing:  Constant Progression:  Waxing and waning Chronicity:  New Smoker: yes   Context: upper respiratory infection and weather changes   Relieved by:  Nothing Worsened by:  Nothing Ineffective treatments:  Beta-agonist inhaler Associated symptoms: headaches, rhinorrhea and wheezing     History reviewed. No pertinent past medical history. History reviewed. No pertinent surgical history. Family History  Problem Relation Age of Onset  . Diabetes Mother    Social History  Substance Use Topics  . Smoking status: Never Smoker  . Smokeless tobacco: Never Used  . Alcohol use Yes     Comment: occasional   OB History    No data available     Review of Systems  Constitutional: Positive for fatigue.  HENT: Positive for rhinorrhea.   Eyes: Negative.   Respiratory: Positive for cough and wheezing.   Cardiovascular: Negative.   Gastrointestinal: Negative.   Endocrine: Negative.   Genitourinary: Negative.   Musculoskeletal: Negative.   Allergic/Immunologic: Negative.   Neurological: Positive for headaches.  Hematological: Negative.   Psychiatric/Behavioral: Negative.     Allergies  Patient has no known allergies.  Home Medications   Prior to Admission medications   Medication Sig Start Date End Date Taking? Authorizing Provider  azithromycin (ZITHROMAX) 250 MG tablet Take 1 tablet (250  mg total) by mouth daily. Take first 2 tablets together, then 1 every day until finished. 11/16/16   Deatra CanterWilliam J Jermany Rimel, FNP  benzonatate (TESSALON) 100 MG capsule Take 2 capsules (200 mg total) by mouth 3 (three) times daily as needed for cough. 11/16/16   Deatra CanterWilliam J Saraiya Kozma, FNP  cephALEXin (KEFLEX) 500 MG capsule Take 1 capsule (500 mg total) by mouth 3 (three) times daily. For 7 days 04/24/16   Junius FinnerErin O'Malley, PA-C  cephALEXin (KEFLEX) 500 MG capsule Take 1 capsule (500 mg total) by mouth 3 (three) times daily. 10/13/16   Vivi BarrackMatthew R Wagoner, DPM  methylPREDNISolone (MEDROL DOSEPAK) 4 MG TBPK tablet Take 6-5-4-3-2-1 po qd 11/16/16   Deatra CanterWilliam J Shonica Weier, FNP  mupirocin ointment [facility](BACTROBAN) 2 % Apply to Left big toe 3 times daily for 5 days 04/24/16   Junius FinnerErin O'Malley, PA-C  naproxen (NAPROSYN) 500 MG tablet One po bid prn headache 11/16/16   Deatra CanterWilliam J Amneet Cendejas, FNP   Meds Ordered and Administered this Visit   Medications  ketorolac (TORADOL) injection 60 mg (60 mg Intramuscular Given 11/16/16 1416)    BP 122/75 (BP Location: Right Arm)   Pulse 116   Temp 98.3 F (36.8 C) (Oral)   Resp 18   LMP 10/24/2016   SpO2 100%  No data found.   Physical Exam  Constitutional: She appears well-developed and well-nourished.  HENT:  Head: Normocephalic and atraumatic.  Right Ear: External ear normal.  Left Ear: External ear normal.  Mouth/Throat: Oropharynx is clear and moist.  Eyes: Conjunctivae and EOM are normal. Pupils are equal, round, and reactive to light.  Neck: Normal range of motion. Neck supple.  Cardiovascular: Normal rate, regular rhythm and normal heart sounds.   Pulmonary/Chest: Effort normal. She has wheezes.  Abdominal: Soft. Bowel sounds are normal.  Nursing note and vitals reviewed.   Urgent Care Course     Procedures (including critical care time)  Labs Review Labs Reviewed - No data to display  Imaging Review No results found.   Visual Acuity Review  Right Eye Distance:   Left Eye  Distance:   Bilateral Distance:    Right Eye Near:   Left Eye Near:    Bilateral Near:         MDM   1. Bronchitis   2. Cough   3. Wheezing   4. Tension-type headache, not intractable, unspecified chronicity pattern    Toradol 60mg  IM Zpak Prednisone taper 4mg  #21 Tessalon Perles 200mg  one po tid prn #21 Continue using albuterol MDI  Push po fluids, rest, tylenol and motrin otc prn as directed for fever, arthralgias, and myalgias.  Follow up prn if sx's continue or persist.    Deatra Canter, FNP 11/16/16 1419    Deatra Canter, FNP 11/16/16 [phone]

## 2016-11-16 NOTE — ED Triage Notes (Signed)
Pt c/o cold sx onset: 1.5 weeks  Sx include: HA, SOB, nasal congestion/drainage, CP, decreased appetite  Denies: fevers  Taking: OTC cold meds w/temp relief.   A&O x4... NAD

## 2017-01-15 ENCOUNTER — Ambulatory Visit: Payer: Medicaid Other | Admitting: Podiatry

## 2017-02-24 ENCOUNTER — Encounter (HOSPITAL_COMMUNITY): Payer: Self-pay | Admitting: Emergency Medicine

## 2017-02-24 ENCOUNTER — Emergency Department (HOSPITAL_COMMUNITY): Payer: Medicaid Other

## 2017-02-24 ENCOUNTER — Emergency Department (HOSPITAL_COMMUNITY)
Admission: EM | Admit: 2017-02-24 | Discharge: 2017-02-24 | Disposition: A | Discharge status: Home / Self Care | Payer: Medicaid Other | Attending: Emergency Medicine | Admitting: Emergency Medicine

## 2017-02-24 DIAGNOSIS — R0789 Other chest pain: Secondary | ICD-10-CM | POA: Diagnosis not present

## 2017-02-24 DIAGNOSIS — Z79899 Other long term (current) drug therapy: Secondary | ICD-10-CM | POA: Diagnosis not present

## 2017-02-24 DIAGNOSIS — R079 Chest pain, unspecified: Secondary | ICD-10-CM | POA: Diagnosis not present

## 2017-02-24 LAB — LIPASE, BLOOD: Lipase: 20 U/L (ref 11–51)

## 2017-02-24 LAB — HEPATIC FUNCTION PANEL
ALBUMIN: 4.5 g/dL (ref 3.5–5.0)
ALT: 31 U/L (ref 14–54)
AST: 36 U/L (ref 15–41)
Alkaline Phosphatase: 93 U/L (ref 38–126)
Bilirubin, Direct: <0.1 mg/dL — ABNORMAL LOW (ref 0.1–0.5)
TOTAL PROTEIN: 8.2 g/dL — AB (ref 6.5–8.1)
Total Bilirubin: 0.7 mg/dL (ref 0.3–1.2)

## 2017-02-24 LAB — BASIC METABOLIC PANEL
ANION GAP: 13 (ref 5–15)
BUN: 11 mg/dL (ref 6–20)
CALCIUM: 10.2 mg/dL (ref 8.9–10.3)
CHLORIDE: 103 mmol/L (ref 101–111)
CO2: 23 mmol/L (ref 22–32)
CREATININE: 0.92 mg/dL (ref 0.44–1.00)
GFR calc non Af Amer: >60 mL/min (ref >60)
Glucose, Bld: 123 mg/dL — ABNORMAL HIGH (ref 65–99)
Potassium: 3.5 mmol/L (ref 3.5–5.1)
SODIUM: 139 mmol/L (ref 135–145)

## 2017-02-24 LAB — CBC
HCT: 38.1 % (ref 36.0–46.0)
HEMOGLOBIN: 12.8 g/dL (ref 12.0–15.0)
MCH: 29.4 pg (ref 26.0–34.0)
MCHC: 33.6 g/dL (ref 30.0–36.0)
MCV: 87.4 fL (ref 78.0–100.0)
Platelets: 296 10*3/uL (ref 150–400)
RBC: 4.36 MIL/uL (ref 3.87–5.11)
RDW: 13.8 % (ref 11.5–15.5)
WBC: 11.2 10*3/uL — ABNORMAL HIGH (ref 4.0–10.5)

## 2017-02-24 LAB — D-DIMER, QUANTITATIVE (NOT AT ARMC): D DIMER QUANT: 0.38 ug{FEU}/mL (ref 0.00–0.50)

## 2017-02-24 LAB — I-STAT TROPONIN, ED: TROPONIN I, POC: 0 ng/mL (ref 0.00–0.08)

## 2017-02-24 MED ORDER — FAMOTIDINE 20 MG PO TABS: 20.0000 mg | ORAL_TABLET | Freq: Two times a day (BID) | ORAL | 0 refills | Status: DC

## 2017-02-24 MED ORDER — GI COCKTAIL ~~LOC~~
30.0000 mL | Freq: Once | ORAL | Status: AC
  Administered 2017-02-24: 30 mL via ORAL
  Filled 2017-02-24: qty 30

## 2017-02-24 NOTE — Discharge Instructions (Signed)
Take pepcid daily as prescribed. Avoid spicy or tomato based foods. Drink plenty of fluids. Take tylenol for pain. Follow up with family doctor if not improving.

## 2017-02-24 NOTE — ED Provider Notes (Signed)
[facility]-EMERGENCY DEPT Provider Note   CSN: [id number] Arrival date & time: 02/24/17  0043     History   Chief Complaint Chief Complaint  Patient presents with  . Chest Pain    HPI Nancy Young is a 31 y.o. female.  HPI Nancy Young is a 31 y.o. female presents to ED wth complaint of chest pain. Pt states pain started 3 days ago, in the center of the chest, radiates up into the throat. Pain is sharp, has been constant initially but now comes and goes. Nothing making it better or worse. It is not exertional. It not not worse with deep breathing or coughing. It is not worsened with eating. She has not tried any medications to help with her pain. No hx of the same. No prior blood clots. No recent travel or surgeries. Denies being pregnant.    History reviewed. No pertinent past medical history.  Patient Active Problem List   Diagnosis Date Noted  . Ingrown toenail 02/16/2016    History reviewed. No pertinent surgical history.  OB History    No data available       Home Medications    Prior to Admission medications   Medication Sig Start Date End Date Taking? Authorizing Provider  azithromycin (ZITHROMAX) 250 MG tablet Take 1 tablet (250 mg total) by mouth daily. Take first 2 tablets together, then 1 every day until finished. Patient not taking: Reported on 02/24/2017 11/16/16   Deatra Canter, FNP  benzonatate (TESSALON) 100 MG capsule Take 2 capsules (200 mg total) by mouth 3 (three) times daily as needed for cough. Patient not taking: Reported on 02/24/2017 11/16/16   Deatra Canter, FNP  cephALEXin (KEFLEX) 500 MG capsule Take 1 capsule (500 mg total) by mouth 3 (three) times daily. For 7 days Patient not taking: Reported on 02/24/2017 04/24/16   Junius Finner, PA-C  cephALEXin (KEFLEX) 500 MG capsule Take 1 capsule (500 mg total) by mouth 3 (three) times daily. Patient not taking: Reported on 02/24/2017 10/13/16   Vivi Barrack, DPM    methylPREDNISolone (MEDROL DOSEPAK) 4 MG TBPK tablet Take 6-5-4-3-2-1 po qd Patient not taking: Reported on 02/24/2017 11/16/16   Deatra Canter, FNP  mupirocin ointment (BACTROBAN) 2 % Apply to Left big toe 3 times daily for 5 days Patient not taking: Reported on 02/24/2017 04/24/16   Junius Finner, PA-C  naproxen (NAPROSYN) 500 MG tablet One po bid prn headache Patient not taking: Reported on 02/24/2017 11/16/16   Deatra Canter, FNP    Family History Family History  Problem Relation Age of Onset  . Diabetes Mother     Social History Social History  Substance Use Topics  . Smoking status: Never Smoker  . Smokeless tobacco: Never Used  . Alcohol use Yes     Comment: occasional     Allergies   Patient has no known allergies.   Review of Systems Review of Systems  Constitutional: Negative for chills and fever.  Respiratory: Positive for chest tightness. Negative for cough and shortness of breath.   Cardiovascular: Positive for chest pain. Negative for palpitations and leg swelling.  Gastrointestinal: Negative for abdominal pain, diarrhea, nausea and vomiting.  Genitourinary: Negative for dysuria, flank pain, pelvic pain, vaginal bleeding, vaginal discharge and vaginal pain.  Musculoskeletal: Negative for arthralgias, myalgias, neck pain and neck stiffness.  Skin: Negative for rash.  Neurological: Negative for dizziness, weakness and headaches.  All other systems reviewed and are negative.    Physical Exam  Updated Vital Signs BP 139/86   Pulse 100   Temp 98.9 F (37.2 C) (Oral)   Resp (!) 26   Ht 5\' 3"  (1.6 m)   Wt 203 lb 1 oz (92.1 kg)   LMP 02/10/2017   SpO2 100%   BMI 35.97 kg/m   Physical Exam  Constitutional: She is oriented to person, place, and time. She appears well-developed and well-nourished. No distress.  HENT:  Head: Normocephalic.  Eyes: Conjunctivae are normal.  Neck: Neck supple.  Cardiovascular: Normal rate, regular rhythm and normal heart  sounds.   Pulmonary/Chest: Effort normal and breath sounds normal. No respiratory distress. She has no wheezes. She has no rales. She exhibits no tenderness.  Abdominal: Soft. Bowel sounds are normal. She exhibits no distension. There is no tenderness. There is no rebound.  Musculoskeletal: She exhibits no edema.  Neurological: She is alert and oriented to person, place, and time.  Skin: Skin is warm and dry.  Psychiatric: She has a normal mood and affect. Her behavior is normal.  Nursing note and vitals reviewed.    ED Treatments / Results  Labs (all labs ordered are listed, but only abnormal results are displayed) Labs Reviewed  BASIC METABOLIC PANEL - Abnormal; Notable for the following:       Result Value   Glucose, Bld 123 (*)    All other components within normal limits  CBC - Abnormal; Notable for the following:    WBC 11.2 (*)    All other components within normal limits  HEPATIC FUNCTION PANEL - Abnormal; Notable for the following:    Total Protein 8.2 (*)    Bilirubin, Direct <0.1 (*)    All other components within normal limits  LIPASE, BLOOD  D-DIMER, QUANTITATIVE (NOT [facility])  I-STAT TROPOININ, ED    EKG  EKG Interpretation  Date/Time:  Saturday Feb 24 2017 01:00:27 EDT Ventricular Rate:  130 PR Interval:    QRS Duration: 82 QT Interval:  396 QTC Calculation: 582 R Axis:   69 Text Interpretation:  Sinus tachycardia Nonspecific T wave abnormality Abnormal ECG Prolonged QT Rate faster Confirmed by Glynn Octaveancour, Stephen [phone]([id number]) on 02/24/2017 5:40:39 AM       Radiology Dg Chest 2 View  Result Date: 02/24/2017 CLINICAL DATA:  Chest pain EXAM: CHEST  2 VIEW COMPARISON:  Chest radiograph 02/24/2015 FINDINGS: The heart size and mediastinal contours are within normal limits. Both lungs are clear. The visualized skeletal structures are unremarkable. IMPRESSION: No active cardiopulmonary disease. Electronically Signed   By: Deatra RobinsonKevin  Herman M.D.   On: 02/24/2017 01:58     Procedures Procedures (including critical care time)  Medications Ordered in ED Medications  gi cocktail (Maalox,Lidocaine,Donnatal) (30 mLs Oral Given 02/24/17 [id number])     Initial Impression / Assessment and Plan / ED Course  I have reviewed the triage vital signs and the nursing notes.  Pertinent labs & imaging results that were available during my care of the patient were reviewed by me and considered in my medical decision making (see chart for details).     Pt in ED with chest pain. Pain is not exertional, not associated with eating, pain is not pleuritic. No cardiac risk factors. Will get labs, CXR, d dimer.   7:24 AM Labs negative. Pt feels maybe mild improvement in her pain after GI cocktail. She states that her pain is mild however at this time. D dimer negative. CXR negative. Will dc home with Tylenol for pain and will try pepcid for  possible acid reflux. Discussed diet choices. Follow up with pcp if not improving. Return precautions discussed.   Vitals:   02/24/17 0059 02/24/17 0101 02/24/17 0102 02/24/17 0700  BP: (!) 153/107   139/86  Pulse: (!) 124   100  Resp: 18   (!) 26  Temp: 98.9 F (37.2 C)     TempSrc: Oral     SpO2: 97%   100%  Weight:  203 lb 1 oz (92.1 kg) 203 lb 1 oz (92.1 kg)   Height:  5\' 3"  (1.6 m)       Final Clinical Impressions(s) / ED Diagnoses   Final diagnoses:  Chest pain, unspecified type    New Prescriptions New Prescriptions   FAMOTIDINE (PEPCID) 20 MG TABLET    Take 1 tablet (20 mg total) by mouth 2 (two) times daily.     Jaynie Crumble, PA-C 02/24/17 1610    Glynn Octave, MD 02/24/17 [phone]

## 2017-02-24 NOTE — ED Notes (Signed)
Pt resting instructed to get dressed appears in no distress

## 2017-02-24 NOTE — ED Triage Notes (Signed)
Reports having heartburn for 2 days.  Describes pain as pressure in center of the chest.  Unable to eat today due to pain.  Tried tums, milk, at home with no relief.

## 2017-04-25 ENCOUNTER — Emergency Department (HOSPITAL_COMMUNITY)
Admission: EM | Admit: 2017-04-25 | Discharge: 2017-04-25 | Disposition: A | Discharge status: Home / Self Care | Payer: Medicaid Other | Attending: Emergency Medicine | Admitting: Emergency Medicine

## 2017-04-25 ENCOUNTER — Ambulatory Visit (HOSPITAL_COMMUNITY)
Admission: RE | Admit: 2017-04-25 | Discharge: 2017-04-25 | Disposition: A | Discharge status: Home / Self Care | Payer: Medicaid Other | Source: Ambulatory Visit | Attending: Vascular Surgery | Admitting: Vascular Surgery

## 2017-04-25 ENCOUNTER — Encounter (HOSPITAL_COMMUNITY): Payer: Self-pay | Admitting: *Deleted

## 2017-04-25 DIAGNOSIS — Z79899 Other long term (current) drug therapy: Secondary | ICD-10-CM | POA: Diagnosis not present

## 2017-04-25 DIAGNOSIS — R2242 Localized swelling, mass and lump, left lower limb: Secondary | ICD-10-CM | POA: Diagnosis not present

## 2017-04-25 DIAGNOSIS — M7989 Other specified soft tissue disorders: Secondary | ICD-10-CM

## 2017-04-25 DIAGNOSIS — A599 Trichomoniasis, unspecified: Secondary | ICD-10-CM | POA: Insufficient documentation

## 2017-04-25 DIAGNOSIS — N898 Other specified noninflammatory disorders of vagina: Secondary | ICD-10-CM | POA: Diagnosis present

## 2017-04-25 HISTORY — DX: Obesity, unspecified: E66.9

## 2017-04-25 LAB — POC URINE PREG, ED: Preg Test, Ur: NEGATIVE

## 2017-04-25 LAB — WET PREP, GENITAL
Sperm: NONE SEEN
Yeast Wet Prep HPF POC: NONE SEEN

## 2017-04-25 MED ORDER — LIDOCAINE HCL 1 % IJ SOLN: 5.0000 mL | Freq: Once | INTRAMUSCULAR | Status: DC

## 2017-04-25 MED ORDER — LIDOCAINE HCL (PF) 1 % IJ SOLN
5.0000 mL | Freq: Once | INTRAMUSCULAR | Status: AC
  Administered 2017-04-25: 5 mL
  Filled 2017-04-25: qty 5

## 2017-04-25 MED ORDER — AZITHROMYCIN 250 MG PO TABS
1000.0000 mg | ORAL_TABLET | Freq: Once | ORAL | Status: AC
  Administered 2017-04-25: 1000 mg via ORAL
  Filled 2017-04-25: qty 4

## 2017-04-25 MED ORDER — METRONIDAZOLE 500 MG PO TABS
500.0000 mg | ORAL_TABLET | Freq: Two times a day (BID) | ORAL | 0 refills | Status: DC
Start: 2017-04-25 — End: 2017-05-22

## 2017-04-25 MED ORDER — CEFTRIAXONE SODIUM 250 MG IJ SOLR
250.0000 mg | Freq: Once | INTRAMUSCULAR | Status: AC
  Administered 2017-04-25: 250 mg via INTRAMUSCULAR
  Filled 2017-04-25: qty 250

## 2017-04-25 NOTE — ED Provider Notes (Signed)
[facility]-EMERGENCY DEPT Provider Note   CSN: [id number] Arrival date & time: 04/25/17  1421     History   Chief Complaint Chief Complaint  Patient presents with  . Foot Pain  . Vaginal Itching    HPI Nancy Young is a 31 y.o. female.  HPI    31 year old female presents today with numerous complaints.  Patient reports 1 week of vaginal discharge and itching.  Patient concern for yeast infection.  She reports she is sexually active only with women.  Patient reports her last menstrual cycle was on June 1, and usually has regular cycles.  She notes that she has been having intermittent pelvic pain in the mornings described as cramping.  She reports this feels like she is about to have her menstrual cycle but has not had it.  She denies any pain presently, she denies any infectious etiology including nausea vomiting upper abdominal pain or fever.  Patient also notes that for several months she has had swelling to her left foot.  She notes this is worse along the dorsal aspect, worse with prolonged periods of standing.  She has noticed that her left calf is larger than her right recently.  She denies any significant pain or swelling to the right lower extremity.  She denies any warmth to touch.  She denies any history DVT or PE, no significant risk factors.  Non-smoker.  Denies trauma.    Past Medical History:  Diagnosis Date  . Obesity     Patient Active Problem List   Diagnosis Date Noted  . Ingrown toenail 02/16/2016    History reviewed. No pertinent surgical history.  OB History    No data available       Home Medications    Prior to Admission medications   Medication Sig Start Date End Date Taking? Authorizing Provider  azithromycin (ZITHROMAX) 250 MG tablet Take 1 tablet (250 mg total) by mouth daily. Take first 2 tablets together, then 1 every day until finished. Patient not taking: Reported on 02/24/2017 11/16/16   Deatra Canterxford, William J, FNP  benzonatate  (TESSALON) 100 MG capsule Take 2 capsules (200 mg total) by mouth 3 (three) times daily as needed for cough. Patient not taking: Reported on 02/24/2017 11/16/16   Deatra Canterxford, William J, FNP  cephALEXin (KEFLEX) 500 MG capsule Take 1 capsule (500 mg total) by mouth 3 (three) times daily. For 7 days Patient not taking: Reported on 02/24/2017 04/24/16   Lurene ShadowPhelps, Erin O, PA-C  cephALEXin (KEFLEX) 500 MG capsule Take 1 capsule (500 mg total) by mouth 3 (three) times daily. Patient not taking: Reported on 02/24/2017 10/13/16   Vivi BarrackWagoner, Matthew R, DPM  famotidine (PEPCID) 20 MG tablet Take 1 tablet (20 mg total) by mouth 2 (two) times daily. 02/24/17   Kirichenko, Lemont Fillersatyana, PA-C  methylPREDNISolone (MEDROL DOSEPAK) 4 MG TBPK tablet Take 6-5-4-3-2-1 po qd Patient not taking: Reported on 02/24/2017 11/16/16   Deatra Canterxford, William J, FNP  metroNIDAZOLE (FLAGYL) 500 MG tablet Take 1 tablet (500 mg total) by mouth 2 (two) times daily. 04/25/17   Arilla Hice, Tinnie GensJeffrey, PA-C  mupirocin ointment (BACTROBAN) 2 % Apply to Left big toe 3 times daily for 5 days Patient not taking: Reported on 02/24/2017 04/24/16   Lurene ShadowPhelps, Erin O, PA-C  naproxen (NAPROSYN) 500 MG tablet One po bid prn headache Patient not taking: Reported on 02/24/2017 11/16/16   Deatra Canterxford, William J, FNP    Family History Family History  Problem Relation Age of Onset  . Diabetes Mother  Social History Social History  Substance Use Topics  . Smoking status: Never Smoker  . Smokeless tobacco: Never Used  . Alcohol use Yes     Comment: occasional     Allergies   Patient has no known allergies.   Review of Systems Review of Systems  All other systems reviewed and are negative.    Physical Exam Updated Vital Signs BP 123/71 (BP Location: Right Arm)   Pulse 85   Temp 98.5 F (36.9 C) (Oral)   Resp 16   LMP 03/09/2017   SpO2 98%   Physical Exam  Constitutional: She is oriented to person, place, and time. She appears well-developed and well-nourished.    HENT:  Head: Normocephalic and atraumatic.  Eyes: Pupils are equal, round, and reactive to light. Conjunctivae are normal. Right eye exhibits no discharge. Left eye exhibits no discharge. No scleral icterus.  Neck: Normal range of motion. No JVD present. No tracheal deviation present.  Pulmonary/Chest: Effort normal. No stridor.  Genitourinary:  Genitourinary Comments: Small amount of white discharge- no rashes, no cervical motion tenderness   Musculoskeletal:  Slight swelling of left lower extremity compared to right- no redness or warmth to touch- sensation intact   Neurological: She is alert and oriented to person, place, and time. Coordination normal.  Psychiatric: She has a normal mood and affect. Her behavior is normal. Judgment and thought content normal.  Nursing note and vitals reviewed.    ED Treatments / Results  Labs (all labs ordered are listed, but only abnormal results are displayed) Labs Reviewed  WET PREP, GENITAL - Abnormal; Notable for the following:       Result Value   Trich, Wet Prep PRESENT (*)    Clue Cells Wet Prep HPF POC PRESENT (*)    WBC, Wet Prep HPF POC MODERATE (*)    All other components within normal limits  POC URINE PREG, ED  GC/CHLAMYDIA PROBE AMP ([place]) NOT AT [facility]    EKG  EKG Interpretation None       Radiology No results found.  Procedures Procedures (including critical care time)  Medications Ordered in ED Medications  cefTRIAXone (ROCEPHIN) injection 250 mg (250 mg Intramuscular Given 04/25/17 1815)  azithromycin (ZITHROMAX) tablet 1,000 mg (1,000 mg Oral Given 04/25/17 1814)  lidocaine (PF) (XYLOCAINE) 1 % injection 5 mL (5 mLs Other Given 04/25/17 1815)     Initial Impression / Assessment and Plan / ED Course  I have reviewed the triage vital signs and the nursing notes.  Pertinent labs & imaging results that were available during my care of the patient were reviewed by me and considered in my medical decision  making (see chart for details).      Final Clinical Impressions(s) / ED Diagnoses   Final diagnoses:  Trichomoniasis  Leg swelling    31 year old female presents today with vaginal discharge and swelling in her left foot and leg.  Patient positive for trichomoniasis here.  She will be treated for gonorrhea and chlamydia after discussion of findings.  Patient discharged home on metronidazole for trichomoniasis.  She will talk to all sexual partners and inform them of today's visit.  Patient also having swelling in the left lower leg.  This appears to be dependent in nature.  Patient does not have any signs of DVT.  Patient has a follow-up appointment with her primary care, she is encouraged to make this appointment, return immediately if she has any new or worsening signs or symptoms.  Patient  verbalized her understanding and agreement to today's plan had no further questions or concerns at time discharge.  New Prescriptions New Prescriptions   METRONIDAZOLE (FLAGYL) 500 MG TABLET    Take 1 tablet (500 mg total) by mouth 2 (two) times daily.     Eyvonne Mechanic, PA-C 04/25/17 1847    Raeford Razor, MD 05/05/17 1311

## 2017-04-25 NOTE — Discharge Instructions (Signed)
Please read attached information. If you experience any new or worsening signs or symptoms please return to the emergency room for evaluation. Please follow-up with your primary care provider or specialist as discussed. Please use medication prescribed only as directed and discontinue taking if you have any concerning signs or symptoms.   °

## 2017-04-25 NOTE — Progress Notes (Signed)
**  Preliminary report by tech**  Left lower extremity venous duplex complete. There is no evidence of deep or superficial vein thrombosis involving the left lower extremity. All visualized vessels appear patent and compressible. There is no evidence of a Baker's cyst on the left. Results were given to Demetrios LollKenneth Leaphart PA.  04/25/17 4:42 PM Olen CordialGreg Tahjay Binion RVT

## 2017-04-25 NOTE — ED Triage Notes (Signed)
Pt has multiple complaints. Reports vaginal irritation, left foot pain and swelling, recent diaphoresis and weakness, reports possible dehydration. No acute distress is noted at triage.

## 2017-04-26 LAB — GC/CHLAMYDIA PROBE AMP (~~LOC~~) NOT AT ARMC
Chlamydia: NEGATIVE
Neisseria Gonorrhea: NEGATIVE

## 2017-05-22 ENCOUNTER — Ambulatory Visit (INDEPENDENT_AMBULATORY_CARE_PROVIDER_SITE_OTHER): Payer: Medicaid Other | Admitting: Family Medicine

## 2017-05-22 ENCOUNTER — Encounter: Payer: Self-pay | Admitting: Family Medicine

## 2017-05-22 VITALS — BP 118/70 | HR 87 | Temp 98.5°F | Ht 63.0 in | Wt 210.8 lb

## 2017-05-22 DIAGNOSIS — Z23 Encounter for immunization: Secondary | ICD-10-CM | POA: Diagnosis not present

## 2017-05-22 DIAGNOSIS — R6889 Other general symptoms and signs: Secondary | ICD-10-CM | POA: Diagnosis not present

## 2017-05-22 DIAGNOSIS — E669 Obesity, unspecified: Secondary | ICD-10-CM | POA: Insufficient documentation

## 2017-05-22 DIAGNOSIS — Z202 Contact with and (suspected) exposure to infections with a predominantly sexual mode of transmission: Secondary | ICD-10-CM

## 2017-05-22 DIAGNOSIS — R7309 Other abnormal glucose: Secondary | ICD-10-CM | POA: Diagnosis not present

## 2017-05-22 DIAGNOSIS — Z6837 Body mass index (BMI) 37.0-37.9, adult: Secondary | ICD-10-CM | POA: Diagnosis not present

## 2017-05-22 DIAGNOSIS — E66812 Obesity, class 2: Secondary | ICD-10-CM

## 2017-05-22 LAB — POCT GLYCOSYLATED HEMOGLOBIN (HGB A1C): Hemoglobin A1C: 5.5

## 2017-05-22 NOTE — Assessment & Plan Note (Signed)
Patient has various intermittent somatic complaints  (SOB, HA, chest pain, palpitations, feet swelling) none of which are present today and for which she has had several ED visits with work up that has been benign. Vitals are stable and she is well appearing on physical exam today with no red flags. Suspect likely d/t anxiety given her health concerns about strong diabetes family history though a1c normal today.   - Reassured patient today and discussed return precautions - Will see back in 1 month to see how she is doing and perhaps [facility] if patient agreeable.

## 2017-05-22 NOTE — Assessment & Plan Note (Signed)
Patient with recent STD exposure was treated for trich in ED. GC/chlamydia neg per chart review. Would like HIV and RPR testing today.

## 2017-05-22 NOTE — Progress Notes (Signed)
Subjective:  Nancy Young is a 31 y.o. female who presents to the [facility] today to establish as new patient  HPI:  Previous PCP was 10+ years ago, last saw [facility] in [place] during her pregnancy. Has otherwise has been seen multiple times in the ED for various complaints for which she would like to discuss today.   - Intermittent SOB over the past 8 months, occurs randomly sometimes at rest and sometimes with exertion. Associated with diaphoresis. Happens several times a day. Will sometimes have wheezing with it but not always. No SOB today.  - Also having some feet swelling a few times a week over the past 3-4 months that she states goes up to her legs sometimes though she thinks very minimal today.  - Has also had few episodes of feeling irregular heart beat. One episode was associated with chest pain a few months ago for which she went to ED and was told to try pepcid which did not made any difference.  - HA and dizziness intermittently for the past 1 year that occurs 2-3 days/wekk, self resolves. No vision changes, syncope, LOC, numbness, weakness - Denies being under stress or recent stressors.  - Very nervous about her health because diabetes run in her family and would like to be checked for this  Health maintenance - no TDAP in the last 10 years - Last pap in 2013 was abnormal and required LEEP procedure - no HIV testing though recently found out her partner had given her trich for which she was treated in the ED, no vaginal discharge at this time.    ROS: per HPI, otherwise all systems reviewed and are negative  PMH:  The following were reviewed and entered/updated in epic: Past Medical History:  Diagnosis Date  . Obesity    There are no active problems to display for this patient.  Past Surgical History:  Procedure Laterality Date  . LEEP  2013    Family History  Problem Relation Age of Onset  . Diabetes Mother   . Hypertension Mother     . Hyperlipidemia Mother   . Bipolar disorder Mother   . Diabetes Maternal Grandmother   . Sickle cell anemia Maternal Grandmother   . Heart attack Maternal Grandmother   . Anxiety disorder Maternal Grandmother   . Hypertension Maternal Grandmother   . Hyperlipidemia Maternal Grandmother   . Stroke Maternal Grandmother   . Hepatitis Maternal Grandfather   . Lung disease Maternal Grandfather   . Diabetes Paternal Grandmother   . Hypertension Paternal Grandmother   . Hyperlipidemia Paternal Grandmother   . Kidney disease Maternal Aunt   . Diabetes Maternal Aunt   . Hypertension Maternal Aunt   . Stomach cancer Other     Medications- reviewed and updated No current outpatient prescriptions on file.   No current facility-administered medications for this visit.     Allergies-reviewed and updated No Known Allergies  Social History   Social History  . Marital status: Single    Spouse name: N/A  . Number of children: N/A  . Years of education: N/A   Occupational History  . CNA    Social History Main Topics  . Smoking status: Never Smoker  . Smokeless tobacco: Never Used  . Alcohol use Yes     Comment: 1-2 drinks per 2-3 times/week  . Drug use: No  . Sexual activity: Yes    Partners: Female    Birth control/ protection: None   Other  Topics Concern  . None   Social History Narrative   Lives with son    Objective:  Physical Exam: BP 118/70   Pulse 87   Temp 98.5 F (36.9 C) (Oral)   Ht 5\' 3"  (1.6 m)   Wt 210 lb 12.8 oz (95.6 kg)   LMP 03/13/2017 (Approximate)   SpO2 99%   BMI 37.34 kg/m   Gen: NAD, resting comfortably HEENT: [place], AT. PERRL, EOMI. MMM. Oropharynx normal. CV: RRR with no murmurs appreciated Pulm: NWOB, CTAB with no crackles, wheezes, or rhonchi GI: Normal bowel sounds present. Soft, Nontender, Nondistended. MSK: no edema, cyanosis, or clubbing noted. 2+ DP pulses. Skin: warm, dry. No rashes Neuro: grossly normal, moves all  extremities Psych: Normal affect and thought content  Results for orders placed or performed in visit on 05/22/17 (from the past 72 hour(s))  HgB A1c     Status: None   Collection Time: 05/22/17 10:15 AM  Result Value Ref Range   Hemoglobin A1C 5.5      Assessment/Plan:  Somatic complaints, multiple Patient has various intermittent somatic complaints  (SOB, HA, chest pain, palpitations, feet swelling) none of which are present today and for which she has had several ED visits with work up that has been benign. Vitals are stable and she is well appearing on physical exam today with no red flags. Suspect likely d/t anxiety given her health concerns about strong diabetes family history though a1c normal today.   - Reassured patient today and discussed return precautions - Will see back in 1 month to see how she is doing and perhaps offer [facility] if patient agreeable.  STD exposure Patient with recent STD exposure was treated for trich in ED. GC/chlamydia neg per chart review. Would like HIV and RPR testing today.  Health Maintenance  - TDAP given today - Patient to schedule appointment for pap, [facility] - obtaining HIV screen today  Leland HerElsia J Delfin Squillace, DO PGY-2, [facility] 05/22/2017 9:35 AM

## 2017-05-22 NOTE — Patient Instructions (Addendum)
It was good to see you today!  Thank you for getting your tetanus booster today.  We will check you for diabetes today and some other labs. If results require attention, either myself or my nurse will get in touch with you. If everything is normal, you will get a letter in the mail or a message in My Chart. Please give Nancy Young a call if you do not hear from Nancy Young after 2 weeks.  Things to do to keep yourself healthy  - Exercise at least 30-45 minutes a day, 3-4 days a week.  - Eat a low-fat diet with lots of fruits and vegetables, up to 7-9 servings per day.  - Seatbelts can save your life. Wear them always.  - Smoke detectors on every level of your home, check batteries every year.  - Eye Doctor - have an eye exam every 1-2 years  - Safe sex - if you may be exposed to STDs, use a condom.  - Alcohol -  If you drink, do it moderately, less than 2 drinks per day.  - Health Care Power of Attorney. Choose someone to speak for you if you are not able.  - Depression is common in our stressful world.If you're feeling down or losing interest in things you normally enjoy, please come in for a visit.  - Violence - If anyone is threatening or hurting you, please call immediately.   Please check-out at the front desk before leaving the clinic. Make an appointment in about 4 weeks for pap and general check up on your symptoms.  Please bring all of your medications with you to each visit.   Sign up for My Chart to have easy access to your labs results, and communication with your primary care physician.  Feel free to call with any questions or concerns at any time, at [phone].   Take care,  Dr. Leland HerElsia J Dara Beidleman, DO [facility]

## 2017-05-23 ENCOUNTER — Telehealth: Payer: Self-pay | Admitting: *Deleted

## 2017-05-23 ENCOUNTER — Encounter: Payer: Self-pay | Admitting: *Deleted

## 2017-05-23 LAB — RPR: RPR: NONREACTIVE

## 2017-05-23 LAB — HIV ANTIBODY (ROUTINE TESTING W REFLEX): HIV SCREEN 4TH GENERATION: NONREACTIVE

## 2017-05-23 NOTE — Telephone Encounter (Signed)
Patient return call. Patient advised of negative results and letter was mailed out today.  Clovis PuMartin, Juvencio Verdi L, RN

## 2017-05-23 NOTE — Telephone Encounter (Signed)
LM for patient to call back and also mailed a letter with normal results. Makana Rostad,CMA

## 2017-05-23 NOTE — Telephone Encounter (Signed)
-----   Message from Leland HerElsia J Yoo, DO sent at 05/23/2017 10:53 AM EDT ----- Please call patient and inform of negative results.

## 2017-06-15 ENCOUNTER — Ambulatory Visit: Payer: Medicaid Other | Admitting: Podiatry

## 2017-07-02 ENCOUNTER — Other Ambulatory Visit (HOSPITAL_COMMUNITY)
Admission: RE | Admit: 2017-07-02 | Discharge: 2017-07-02 | Disposition: A | Discharge status: Home / Self Care | Payer: Medicaid Other | Source: Ambulatory Visit | Attending: Family Medicine | Admitting: Family Medicine

## 2017-07-02 ENCOUNTER — Encounter: Payer: Self-pay | Admitting: Family Medicine

## 2017-07-02 ENCOUNTER — Ambulatory Visit (INDEPENDENT_AMBULATORY_CARE_PROVIDER_SITE_OTHER): Payer: Medicaid Other | Admitting: Family Medicine

## 2017-07-02 VITALS — BP 128/86 | HR 92 | Temp 98.7°F | Ht 63.0 in | Wt 206.2 lb

## 2017-07-02 DIAGNOSIS — Z6836 Body mass index (BMI) 36.0-36.9, adult: Secondary | ICD-10-CM | POA: Diagnosis not present

## 2017-07-02 DIAGNOSIS — Z124 Encounter for screening for malignant neoplasm of cervix: Secondary | ICD-10-CM

## 2017-07-02 DIAGNOSIS — Z23 Encounter for immunization: Secondary | ICD-10-CM

## 2017-07-02 DIAGNOSIS — Z0000 Encounter for general adult medical examination without abnormal findings: Secondary | ICD-10-CM

## 2017-07-02 DIAGNOSIS — Z1151 Encounter for screening for human papillomavirus (HPV): Secondary | ICD-10-CM | POA: Insufficient documentation

## 2017-07-02 DIAGNOSIS — E669 Obesity, unspecified: Secondary | ICD-10-CM

## 2017-07-02 NOTE — Patient Instructions (Addendum)
It was good to see you today!  For your general health,  - We are checking a pap smear today. If results require attention, either myself or my nurse will get in touch with you. If everything is normal, you will get a letter in the mail or a message in My Chart. Please give [place] a call if you do not hear from [place] after 2 weeks. - Thank you for getting a flu shot today.  - Great job on the exercising and cutting out of juices/sugary drinks as well as eating more lean meats! Your hard work is paying off!    Please check-out at the front desk before leaving the clinic. Make an appointment in  2 months to see how you're doing on your weight loss.    Please bring all of your medications with you to each visit.   Sign up for My Chart to have easy access to your labs results, and communication with your primary care physician.  Feel free to call with any questions or concerns at any time, at [phone].   Take care,  Dr. Leland Her, DO [facility]

## 2017-07-02 NOTE — Progress Notes (Signed)
    Subjective:  Nancy Young is a 31 y.o. female who presents to the [facility] today for pap smear  HPI:  Obesity - has cut out juices and sugary drinks of her diet, and drinking water now - has started exercising daily. Follows a Youtube program for 30 minutes of cardio each morning followed by about 15-20 minutes of walking. Does state that she becomes short of breath on exertion without wheezing or chest pain or palpitations. This shortness of breath is associated with sweating. States that has noticed no shortness of breath at rest  Health maintenance - Amenable to flu shot today - Would like Pap smear today   ROS: Per HPI  Objective:  Physical Exam: BP 128/86   Pulse 92   Temp 98.7 F (37.1 C) (Oral)   Ht  (1.6 m)   Wt 206 lb 3.2 oz (93.5 kg)   LMP 05/26/2017 (Exact Date)   SpO2 97%   BMI 36.53 kg/m   Gen: NAD, resting comfortably CV: RRR with no murmurs appreciated Pulm: NWOB, CTAB with no crackles, wheezes, or rhonchi GI: Normal bowel sounds present. Soft, Nontender, Nondistended. GU: normal female external genitalia. Cervix without lesions but post LEEP changes visualized. No CMT. MSK: no edema, cyanosis, or clubbing noted Skin: warm, dry Neuro: grossly normal, moves all extremities Psych: Normal affect and thought content  Assessment/Plan:  Obesity Encouraged patient on her good healthy lifestyle modifications, she is down 4 pounds from last visit. Suspect that her shortness of breath is secondary to exertion during exercise from deconditioning which should improve as patient continues to be more active regularly. Patient next goal for implementing healthy diet is to reduce carb intake. Follow-up in 2 months to see how she is doing with these changes.   Health care maintenance - flu shot given today - Pap collected today. Of note has had LEEP procedure in the past, in 2013 per patient report   Leland Her, DO PGY-2, [facility] 07/02/2017 9:26 AM

## 2017-07-02 NOTE — Assessment & Plan Note (Signed)
Encouraged patient on her good healthy lifestyle modifications, she is down 4 pounds from last visit. Suspect that her shortness of breath is secondary to exertion during exercise from deconditioning which should improve as patient continues to be more active regularly. Patient next goal for implementing healthy diet is to reduce carb intake. Follow-up in 2 months to see how she is doing with these changes.

## 2017-07-02 NOTE — Assessment & Plan Note (Signed)
-   flu shot given today - Pap collected today. Of note has had LEEP procedure in the past, in 2013 per patient report

## 2017-07-03 LAB — CYTOLOGY - PAP
Adequacy: ABSENT
Diagnosis: NEGATIVE
HPV: NOT DETECTED

## 2017-07-05 ENCOUNTER — Encounter: Payer: Self-pay | Admitting: Family Medicine

## 2017-07-12 ENCOUNTER — Telehealth: Payer: Self-pay | Admitting: *Deleted

## 2017-07-12 NOTE — Telephone Encounter (Signed)
Patient called stating she spoke with someone last week stating she had a bacterial infection. She wanted to know if medication would be called in.  Advised patient that provider did not mentioned anything her last office visit or completed a wet prep for symptoms of bacterial infection.  Review patient's PAP result; stated change in flora suggestive of bacteria infection. Pt denies any symptoms. Advised patient that provider would not treat if she did not see or patient did not mention symptoms.  Clovis Pu, RN

## 2017-07-27 ENCOUNTER — Encounter: Payer: Self-pay | Admitting: Family Medicine

## 2017-07-27 ENCOUNTER — Ambulatory Visit (INDEPENDENT_AMBULATORY_CARE_PROVIDER_SITE_OTHER): Payer: Medicaid Other | Admitting: Family Medicine

## 2017-07-27 ENCOUNTER — Other Ambulatory Visit (HOSPITAL_COMMUNITY)
Admission: RE | Admit: 2017-07-27 | Discharge: 2017-07-27 | Disposition: A | Discharge status: Home / Self Care | Payer: Medicaid Other | Source: Ambulatory Visit | Attending: Family Medicine | Admitting: Family Medicine

## 2017-07-27 VITALS — BP 122/68 | HR 90 | Temp 97.8°F | Ht 63.0 in | Wt 206.0 lb

## 2017-07-27 DIAGNOSIS — B9689 Other specified bacterial agents as the cause of diseases classified elsewhere: Secondary | ICD-10-CM | POA: Insufficient documentation

## 2017-07-27 DIAGNOSIS — N898 Other specified noninflammatory disorders of vagina: Secondary | ICD-10-CM

## 2017-07-27 DIAGNOSIS — N760 Acute vaginitis: Secondary | ICD-10-CM

## 2017-07-27 HISTORY — DX: Other specified noninflammatory disorders of vagina: N89.8

## 2017-07-27 LAB — POCT WET PREP (WET MOUNT)
Clue Cells Wet Prep Whiff POC: POSITIVE
Trichomonas Wet Prep HPF POC: ABSENT

## 2017-07-27 MED ORDER — METRONIDAZOLE 500 MG PO TABS: 500.0000 mg | ORAL_TABLET | Freq: Two times a day (BID) | ORAL | 0 refills | Status: AC

## 2017-07-27 NOTE — Patient Instructions (Signed)
   It was great seeing you today!  We will call or send you a letter with your other lab results. Please return to be seen if your symptoms worsen or don't get better.   If you have questions or concerns please do not hesitate to call at [phone].  Dolores PattyAngela Jaryn Hocutt, DO PGY-2, [facility] 07/27/2017 1:48 PM   Bacterial Vaginosis Bacterial vaginosis is an infection of the vagina. It happens when too many germs (bacteria) grow in the vagina. This infection puts you at risk for infections from sex (STIs). Treating this infection can lower your risk for some STIs. You should also treat this if you are pregnant. It can cause your baby to be born early. Follow these instructions at home: Medicines  Take over-the-counter and prescription medicines only as told by your doctor.  Take or use your antibiotic medicine as told by your doctor. Do not stop taking or using it even if you start to feel better. General instructions  If you your sexual partner is a woman, tell her that you have this infection. She needs to get treatment if she has symptoms. If you have a female partner, he does not need to be treated.  During treatment: ? Avoid sex. ? Do not douche. ? Avoid alcohol as told. ? Avoid breastfeeding as told.  Drink enough fluid to keep your pee (urine) clear or pale yellow.  Keep your vagina and butt (rectum) clean. ? Wash the area with warm water every day. ? Wipe from front to back after you use the toilet.  Keep all follow-up visits as told by your doctor. This is important. Preventing this condition  Do not douche.  Use only warm water to wash around your vagina.  Use protection when you have sex. This includes: ? Latex condoms. ? Dental dams.  Limit how many people you have sex with. It is best to only have sex with the same person (be monogamous).  Get tested for STIs. Have your partner get tested.  Wear underwear that is cotton or lined with  cotton.  Avoid tight pants and pantyhose. This is most important in summer.  Do not use any products that have nicotine or tobacco in them. These include cigarettes and e-cigarettes. If you need help quitting, ask your doctor.  Do not use illegal drugs.  Limit how much alcohol you drink. Contact a doctor if:  Your symptoms do not get better, even after you are treated.  You have more discharge or pain when you pee (urinate).  You have a fever.  You have pain in your belly (abdomen).  You have pain with sex.  Your bleed from your vagina between periods. Summary  This infection happens when too many germs (bacteria) grow in the vagina.  Treating this condition can lower your risk for some infections from sex (STIs).  You should also treat this if you are pregnant. It can cause early (premature) birth.  Do not stop taking or using your antibiotic medicine even if you start to feel better. This information is not intended to replace advice given to you by your health care provider. Make sure you discuss any questions you have with your health care provider. Document Released: 07/04/2008 Document Revised: 06/10/2016 Document Reviewed: 06/10/2016 Elsevier Interactive Patient Education  2017 [employer]

## 2017-07-27 NOTE — Progress Notes (Signed)
    Subjective:    Patient ID: Nancy Young, female    DOB: 13-Nov-1985, 31 y.o.   MRN: [id number]   CC: vaginal discharge  HPI: c/o vaginal discharge with foul smell for past week that came on after she finished her menstrual cycle. Reports the discharge has been "dark and mucousy" but now light and thick. She also endorses some pelvic cramping and discomfort. Denies fevers, chills, no dysuria, no flank pain. Sexually active with one female partner. No new partners.   Smoking status reviewed- non-smoker  Review of Systems- see HPI   Objective:  BP 122/68   Pulse 90   Temp 97.8 F (36.6 C) (Oral)   Ht 5\' 3"  (1.6 m)   Wt 206 lb (93.4 kg)   LMP 07/20/2017 (Approximate)   SpO2 99%   BMI 36.49 kg/m  Vitals and nursing note reviewed  General: well nourished, in no acute distress Abdomen: soft, nontender, nondistended, no masses or organomegaly. Bowel sounds present GU: normal female external genitalia, no rashes or lesions. Moderate amount of thick white discharge present. No CMT, no masses appreciated. Neuro: alert and oriented, no focal deficits   Assessment & Plan:    Vaginal discharge  Wet prep consistent with BV  -rx for flagyl sent to pharmacy -gc/chlamydia pending, will update patient with results -follow up if symptoms worsen or fail to improve   Return if symptoms worsen or fail to improve.   Dolores PattyAngela Riccio, DO Family Medicine Resident PGY-2

## 2017-07-27 NOTE — Assessment & Plan Note (Signed)
  Wet prep consistent with BV  -rx for flagyl sent to pharmacy -gc/chlamydia pending, will update patient with results -follow up if symptoms worsen or fail to improve

## 2017-07-30 ENCOUNTER — Telehealth: Payer: Self-pay | Admitting: *Deleted

## 2017-07-30 LAB — CERVICOVAGINAL ANCILLARY ONLY
Chlamydia: NEGATIVE
NEISSERIA GONORRHEA: NEGATIVE

## 2017-07-30 NOTE — Telephone Encounter (Signed)
Pt lm on nurse line.  She would like to know the results from her test on 07/27/17. Manson Luckadoo, Maryjo RochesterJessica Dawn, CMA

## 2017-07-30 NOTE — Telephone Encounter (Signed)
We're still waiting on final results but will notify patient once they are back! Thank you.

## 2017-07-31 NOTE — Telephone Encounter (Signed)
Patient informed of normal results by Robyne PeersHarriett Shelton.  Myrissa Chipley,CMA

## 2017-07-31 NOTE — Telephone Encounter (Signed)
LM for patient to call back.  Please inform her of normal resutls when she does.  Thank you Jazmin Hartsell,CMA

## 2017-07-31 NOTE — Telephone Encounter (Signed)
-----   Message from Tillman SersAngela C Riccio, DO sent at 07/30/2017  4:55 PM EDT ----- Please inform patient of negative lab results. She called to ask about these but they were not back yet. Thank you.

## 2017-09-04 ENCOUNTER — Ambulatory Visit: Payer: Medicaid Other

## 2017-09-05 ENCOUNTER — Ambulatory Visit (HOSPITAL_COMMUNITY)
Admission: EM | Admit: 2017-09-05 | Discharge: 2017-09-05 | Disposition: A | Discharge status: Home / Self Care | Payer: Medicaid Other | Attending: Family Medicine | Admitting: Family Medicine

## 2017-09-05 ENCOUNTER — Encounter (HOSPITAL_COMMUNITY): Payer: Self-pay | Admitting: Family Medicine

## 2017-09-05 ENCOUNTER — Other Ambulatory Visit: Payer: Self-pay

## 2017-09-05 DIAGNOSIS — N898 Other specified noninflammatory disorders of vagina: Secondary | ICD-10-CM | POA: Diagnosis not present

## 2017-09-05 DIAGNOSIS — Z818 Family history of other mental and behavioral disorders: Secondary | ICD-10-CM | POA: Insufficient documentation

## 2017-09-05 DIAGNOSIS — Z841 Family history of disorders of kidney and ureter: Secondary | ICD-10-CM | POA: Diagnosis not present

## 2017-09-05 DIAGNOSIS — Z809 Family history of malignant neoplasm, unspecified: Secondary | ICD-10-CM | POA: Insufficient documentation

## 2017-09-05 DIAGNOSIS — Z833 Family history of diabetes mellitus: Secondary | ICD-10-CM | POA: Diagnosis not present

## 2017-09-05 DIAGNOSIS — Z836 Family history of other diseases of the respiratory system: Secondary | ICD-10-CM | POA: Diagnosis not present

## 2017-09-05 DIAGNOSIS — Z8249 Family history of ischemic heart disease and other diseases of the circulatory system: Secondary | ICD-10-CM | POA: Insufficient documentation

## 2017-09-05 DIAGNOSIS — B379 Candidiasis, unspecified: Secondary | ICD-10-CM | POA: Diagnosis present

## 2017-09-05 DIAGNOSIS — E669 Obesity, unspecified: Secondary | ICD-10-CM | POA: Diagnosis not present

## 2017-09-05 DIAGNOSIS — Z823 Family history of stroke: Secondary | ICD-10-CM | POA: Insufficient documentation

## 2017-09-05 LAB — POCT URINALYSIS DIP (DEVICE)
Bilirubin Urine: NEGATIVE
GLUCOSE, UA: NEGATIVE mg/dL
KETONES UR: NEGATIVE mg/dL
Nitrite: NEGATIVE
Protein, ur: NEGATIVE mg/dL
SPECIFIC GRAVITY, URINE: 1.02 (ref 1.005–1.030)
Urobilinogen, UA: 0.2 mg/dL (ref 0.0–1.0)
pH: 7 (ref 5.0–8.0)

## 2017-09-05 MED ORDER — FLUCONAZOLE 150 MG PO TABS: 150.0000 mg | ORAL_TABLET | Freq: Once | ORAL | 0 refills | Status: AC

## 2017-09-05 NOTE — ED Notes (Signed)
Patient verbalized understanding of discharge instructions and denies any further needs or questions at this time. VS stable. Patient ambulatory with steady gait.  

## 2017-09-05 NOTE — ED Provider Notes (Signed)
[facility]   [id number] 09/05/17 Arrival Time: 1743   SUBJECTIVE:  Nancy Young is a 31 y.o. female who presents to the urgent care with complaint of possible yeast infection , pt has been experiencing thick white discharge and itching x3 days   monogamous  Past Medical History:  Diagnosis Date  . Obesity    Family History  Problem Relation Age of Onset  . Diabetes Mother   . Hypertension Mother   . Hyperlipidemia Mother   . Bipolar disorder Mother   . Diabetes Maternal Grandmother   . Sickle cell anemia Maternal Grandmother   . Heart attack Maternal Grandmother   . Anxiety disorder Maternal Grandmother   . Hypertension Maternal Grandmother   . Hyperlipidemia Maternal Grandmother   . Stroke Maternal Grandmother   . Hepatitis Maternal Grandfather   . Lung disease Maternal Grandfather   . Diabetes Paternal Grandmother   . Hypertension Paternal Grandmother   . Hyperlipidemia Paternal Grandmother   . Kidney disease Maternal Aunt   . Diabetes Maternal Aunt   . Hypertension Maternal Aunt   . Stomach cancer Other    Social History   Socioeconomic History  . Marital status: Single    Spouse name: Not on file  . Number of children: Not on file  . Years of education: Not on file  . Highest education level: Not on file  Social Needs  . Financial resource strain: Not on file  . Food insecurity - worry: Not on file  . Food insecurity - inability: Not on file  . Transportation needs - medical: Not on file  . Transportation needs - non-medical: Not on file  Occupational History  . Occupation: CNA  Tobacco Use  . Smoking status: Never Smoker  . Smokeless tobacco: Never Used  Substance and Sexual Activity  . Alcohol use: Yes    Comment: 1-2 drinks per 2-3 times/week  . Drug use: No  . Sexual activity: Yes    Partners: Female    Birth control/protection: None  Other Topics Concern  . Not on file  Social History Narrative   Lives with son    No outpatient medications have been marked as taking for the 09/05/17 encounter [facility](Hospital Encounter).   No Known Allergies    ROS: As per HPI, remainder of ROS negative.   OBJECTIVE:   Vitals:   09/05/17 1752  BP: 121/61  Pulse: 87  Resp: 16  Temp: 97.9 F (36.6 C)  TempSrc: Oral  SpO2: 99%     General appearance: alert; no distress Eyes: PERRL; EOMI; conjunctiva normal HENT: normocephalic; atraumatic;, external ears normal without trauma; nasal mucosa normal; oral mucosa normal Neck: supple Back: no CVA tenderness Extremities: no cyanosis or edema; symmetrical with no gross deformities Skin: warm and dry Neurologic: normal gait; grossly normal Psychological: alert and cooperative; normal mood and affect      Labs:  Results for orders placed or performed during the hospital encounter of 09/05/17  POCT urinalysis dip (device)  Result Value Ref Range   Glucose, UA NEGATIVE NEGATIVE mg/dL   Bilirubin Urine NEGATIVE NEGATIVE   Ketones, ur NEGATIVE NEGATIVE mg/dL   Specific Gravity, Urine 1.020 1.005 - 1.030   Hgb urine dipstick TRACE (A) NEGATIVE   pH 7.0 5.0 - 8.0   Protein, ur NEGATIVE NEGATIVE mg/dL   Urobilinogen, UA 0.2 0.0 - 1.0 mg/dL   Nitrite NEGATIVE NEGATIVE   Leukocytes, UA SMALL (A) NEGATIVE    Labs Reviewed  POCT URINALYSIS DIP (DEVICE) -  Abnormal; Notable for the following components:      Result Value   Hgb urine dipstick TRACE (*)    Leukocytes, UA SMALL (*)    All other components within normal limits  URINE CYTOLOGY ANCILLARY ONLY    No results found.     ASSESSMENT & PLAN:  1. Monilia infection     Meds ordered this encounter  Medications  . fluconazole (DIFLUCAN) 150 MG tablet    Sig: Take 1 tablet (150 mg total) by mouth once for 1 dose. Repeat if needed    Dispense:  2 tablet    Refill:  0    Reviewed expectations re: course of current medical issues. Questions answered. Outlined signs and symptoms indicating need  for more acute intervention. Patient verbalized understanding. After Visit Summary given.    Procedures:      Elvina SidleLauenstein, Montrez Marietta, MD 09/05/17 1810

## 2017-09-05 NOTE — ED Triage Notes (Signed)
[facility] , pt has been experiencing thick white discharge and itching x3 days

## 2017-09-06 LAB — URINE CYTOLOGY ANCILLARY ONLY
Chlamydia: NEGATIVE
Neisseria Gonorrhea: NEGATIVE
Trichomonas: NEGATIVE

## 2017-09-09 ENCOUNTER — Ambulatory Visit (HOSPITAL_COMMUNITY)
Admission: EM | Admit: 2017-09-09 | Discharge: 2017-09-09 | Disposition: A | Discharge status: Home / Self Care | Payer: Medicaid Other | Attending: Physician Assistant | Admitting: Physician Assistant

## 2017-09-09 ENCOUNTER — Encounter (HOSPITAL_COMMUNITY): Payer: Self-pay | Admitting: *Deleted

## 2017-09-09 ENCOUNTER — Other Ambulatory Visit: Payer: Self-pay

## 2017-09-09 DIAGNOSIS — J069 Acute upper respiratory infection, unspecified: Secondary | ICD-10-CM

## 2017-09-09 DIAGNOSIS — J040 Acute laryngitis: Secondary | ICD-10-CM | POA: Diagnosis not present

## 2017-09-09 MED ORDER — BENZONATATE 100 MG PO CAPS: 100.0000 mg | ORAL_CAPSULE | Freq: Three times a day (TID) | ORAL | 0 refills | Status: DC

## 2017-09-09 MED ORDER — PREDNISONE 10 MG PO TABS: 40.0000 mg | ORAL_TABLET | Freq: Every day | ORAL | 0 refills | Status: AC

## 2017-09-09 NOTE — ED Provider Notes (Signed)
[facility]    CSN: [id number] Arrival date & time: 09/09/17  1943     History   Chief Complaint Chief Complaint  Patient presents with  . Cough  . Nasal Congestion    HPI Nancy Young is a 31 y.o. female.   31 year-old female, presenting today due to nasal congestion and loss of voice. She states symptoms started about a week ago. States that she gets the same symptoms each year and typically prednisone helps. She denies fever, sore throat, shortness of breath, chest pain    The history is provided by the patient.  Cough  Cough characteristics:  Non-productive Severity:  Moderate Onset quality:  Gradual Duration:  1 week Timing:  Intermittent Progression:  Unchanged Chronicity:  Recurrent Smoker: no   Context: sick contacts   Context: not animal exposure, not exposure to allergens, not fumes, not occupational exposure and not smoke exposure   Relieved by:  Nothing Worsened by:  Nothing Ineffective treatments:  None tried Associated symptoms: chills   Associated symptoms: no chest pain, no diaphoresis, no ear fullness, no ear pain, no eye discharge, no fever, no headaches, no myalgias, no rash, no rhinorrhea, no shortness of breath, no sinus congestion, no sore throat, no weight loss and no wheezing   Risk factors: no chemical exposure, no recent infection and no recent travel     Past Medical History:  Diagnosis Date  . Obesity     Patient Active Problem List   Diagnosis Date Noted  . Vaginal discharge 07/27/2017  . Health care maintenance 07/02/2017  . Obesity 05/22/2017    Past Surgical History:  Procedure Laterality Date  . LEEP  2013    OB History    No data available       Home Medications    Prior to Admission medications   Medication Sig Start Date End Date Taking? Authorizing Provider  benzonatate (TESSALON) 100 MG capsule Take 1 capsule (100 mg total) by mouth every 8 (eight) hours. 09/09/17   Roshard Rezabek C, PA-C    predniSONE (DELTASONE) 10 MG tablet Take 4 tablets (40 mg total) by mouth daily for 5 days. 09/09/17 09/14/17  Alecia LemmingBlue, Camora Tremain C, PA-C    Family History Family History  Problem Relation Age of Onset  . Diabetes Mother   . Hypertension Mother   . Hyperlipidemia Mother   . Bipolar disorder Mother   . Diabetes Maternal Grandmother   . Sickle cell anemia Maternal Grandmother   . Heart attack Maternal Grandmother   . Anxiety disorder Maternal Grandmother   . Hypertension Maternal Grandmother   . Hyperlipidemia Maternal Grandmother   . Stroke Maternal Grandmother   . Hepatitis Maternal Grandfather   . Lung disease Maternal Grandfather   . Diabetes Paternal Grandmother   . Hypertension Paternal Grandmother   . Hyperlipidemia Paternal Grandmother   . Kidney disease Maternal Aunt   . Diabetes Maternal Aunt   . Hypertension Maternal Aunt   . Stomach cancer Other     Social History Social History   Tobacco Use  . Smoking status: Never Smoker  . Smokeless tobacco: Never Used  Substance Use Topics  . Alcohol use: Yes    Comment: 1-2 drinks per 2-3 times/week  . Drug use: No     Allergies   Patient has no known allergies.   Review of Systems Review of Systems  Constitutional: Positive for chills. Negative for diaphoresis, fever and weight loss.  HENT: Positive for voice change. Negative for  ear pain, rhinorrhea and sore throat.   Eyes: Negative for pain, discharge and visual disturbance.  Respiratory: Positive for cough. Negative for shortness of breath and wheezing.   Cardiovascular: Negative for chest pain and palpitations.  Gastrointestinal: Negative for abdominal pain and vomiting.  Genitourinary: Negative for dysuria and hematuria.  Musculoskeletal: Negative for arthralgias, back pain and myalgias.  Skin: Negative for color change and rash.  Neurological: Negative for seizures, syncope and headaches.  All other systems reviewed and are negative.    Physical  Exam Triage Vital Signs ED Triage Vitals  Enc Vitals Group     BP 09/09/17 1950 119/68     Pulse Rate 09/09/17 1950 93     Resp 09/09/17 1950 20     Temp 09/09/17 1950 98.2 F (36.8 C)     Temp Source 09/09/17 1950 Oral     SpO2 09/09/17 1950 100 %     Weight --      Height --      Head Circumference --      Peak Flow --      Pain Score 09/09/17 1951 7     Pain Loc --      Pain Edu? --      Excl. in GC? --    No data found.  Updated Vital Signs BP 119/68   Pulse 93   Temp 98.2 F (36.8 C) (Oral)   Resp 20   LMP 08/11/2017 (Exact Date)   SpO2 100%   Visual Acuity Right Eye Distance:   Left Eye Distance:   Bilateral Distance:    Right Eye Near:   Left Eye Near:    Bilateral Near:     Physical Exam  Constitutional: She appears well-developed and well-nourished. No distress.  HENT:  Head: Normocephalic and atraumatic.  Right Ear: Hearing, tympanic membrane, external ear and ear canal normal.  Left Ear: Hearing, tympanic membrane, external ear and ear canal normal.  Nose: Nose normal.  Mouth/Throat: Oropharynx is clear and moist. No oropharyngeal exudate, posterior oropharyngeal edema, posterior oropharyngeal erythema or tonsillar abscesses.  Eyes: Conjunctivae are normal.  Neck: Neck supple.  Cardiovascular: Normal rate and regular rhythm.  No murmur heard. Pulmonary/Chest: Effort normal and breath sounds normal. No stridor. No respiratory distress. She has no wheezes. She has no rhonchi. She has no rales.  Abdominal: Soft. There is no tenderness.  Musculoskeletal: She exhibits no edema.  Neurological: She is alert.  Skin: Skin is warm and dry.  Psychiatric: She has a normal mood and affect.  Nursing note and vitals reviewed.    UC Treatments / Results  Labs (all labs ordered are listed, but only abnormal results are displayed) Labs Reviewed - No data to display  EKG  EKG Interpretation None       Radiology No results  found.  Procedures Procedures (including critical care time)  Medications Ordered in UC Medications - No data to display   Initial Impression / Assessment and Plan / UC Course  I have reviewed the triage vital signs and the nursing notes.  Pertinent labs & imaging results that were available during my care of the patient were reviewed by me and considered in my medical decision making (see chart for details).     Viral uri - tessalon for cough Laryngitis - prednisone  Final Clinical Impressions(s) / UC Diagnoses   Final diagnoses:  Upper respiratory tract infection, unspecified type  Laryngitis    ED Discharge Orders  Ordered    predniSONE (DELTASONE) 10 MG tablet  Daily     09/09/17 1959    benzonatate (TESSALON) 100 MG capsule  Every 8 hours     09/09/17 1959       Controlled Substance Prescriptions [place] Controlled Substance Registry consulted? Not Applicable   Cameron AliBlue, Long Brimage C, PA-C 09/09/17 2002

## 2017-09-09 NOTE — ED Triage Notes (Signed)
C/O cough and congestion with hoarse voice x approx 1 wk.  C/O dry cough; has tried tea with lemon, Mucinex, Nyquil without relief.  C/O intermittently feeling feverish.

## 2017-09-10 LAB — URINE CYTOLOGY ANCILLARY ONLY
Bacterial vaginitis: NEGATIVE
Candida vaginitis: NEGATIVE

## 2017-11-08 ENCOUNTER — Ambulatory Visit (INDEPENDENT_AMBULATORY_CARE_PROVIDER_SITE_OTHER): Payer: Medicaid Other | Admitting: Family Medicine

## 2017-11-08 ENCOUNTER — Other Ambulatory Visit: Payer: Self-pay

## 2017-11-08 ENCOUNTER — Other Ambulatory Visit (HOSPITAL_COMMUNITY)
Admission: RE | Admit: 2017-11-08 | Discharge: 2017-11-08 | Disposition: A | Discharge status: Home / Self Care | Payer: Medicaid Other | Source: Ambulatory Visit | Attending: Family Medicine | Admitting: Family Medicine

## 2017-11-08 ENCOUNTER — Encounter: Payer: Self-pay | Admitting: Family Medicine

## 2017-11-08 VITALS — BP 120/62 | HR 73 | Temp 98.0°F | Wt 208.0 lb

## 2017-11-08 DIAGNOSIS — B9689 Other specified bacterial agents as the cause of diseases classified elsewhere: Secondary | ICD-10-CM | POA: Insufficient documentation

## 2017-11-08 DIAGNOSIS — N760 Acute vaginitis: Secondary | ICD-10-CM | POA: Diagnosis present

## 2017-11-08 LAB — POCT WET PREP (WET MOUNT)
Clue Cells Wet Prep Whiff POC: NEGATIVE
TRICHOMONAS WET PREP HPF POC: ABSENT

## 2017-11-08 MED ORDER — METRONIDAZOLE 500 MG PO TABS: 500.0000 mg | ORAL_TABLET | Freq: Two times a day (BID) | ORAL | 0 refills | Status: AC

## 2017-11-08 NOTE — Progress Notes (Signed)
    Subjective:  Nancy Young is a 32 y.o. female who [facility] with a chief complaint of vaginal discharge.   HPI:  Vaginal discharge - 2 weeks of heavy discharge, foul odor - similar to BV she has had in the past  - no itching, no urinary symptoms, no   Has had some issues with anxiety. Some feelings of panic. Wants to come back for appointment for this. No thoughts of self harm or harm to others.  ROS: Per HPI  Objective:  Physical Exam: BP 120/62   Pulse 73   Temp 98 F (36.7 C) (Oral)   Wt 208 lb (94.3 kg)   LMP 09/24/2017   SpO2 99%   BMI 36.85 kg/m   Gen: NAD, resting comfortably Pelvic exam: normal external genitalia, vulva, cervix, uterus and adnexa. Normal vaginal rugae with profuse discharge. No foul odor.  Neuro: grossly normal, moves all extremities Psych: Normal affect and thought content  Results for orders placed or performed in visit on 11/08/17 (from the past 72 hour(s))  POCT Wet Prep Mellody Drown(Wet [place])     Status: Abnormal   Collection Time: 11/08/17  9:44 AM  Result Value Ref Range   Source Wet Prep POC VAG    WBC, Wet Prep HPF POC 0-3    Bacteria Wet Prep HPF POC Few Few   Clue Cells Wet Prep HPF POC Few (A) None   Clue Cells Wet Prep Whiff POC Negative Whiff    Yeast Wet Prep HPF POC None    Trichomonas Wet Prep HPF POC Absent Absent     Assessment/Plan:  Bacterial vaginosis Symptoms and wet prep consistent with BV. Treat with metronidazole. GC/chlamydia pending.    Nancy HerElsia J Shaliyah Taite, DO PGY-2, [facility] 11/08/2017 9:47 AM

## 2017-11-08 NOTE — Patient Instructions (Signed)
It was good to see you today!  Take metronidazole for bacterial vaginosis     Please check-out at the front desk before leaving the clinic. Make an appointment whenever is good for you to come back and talk about your anxiety.  Sign up for My Chart to have easy access to your labs results, and communication with your primary care physician.  Feel free to call with any questions or concerns at any time, at [phone].   Take care,  Dr. Leland HerElsia J Yoo, DO [facility]    Bacterial Vaginosis Bacterial vaginosis is an infection of the vagina. It happens when too many germs (bacteria) grow in the vagina. This infection puts you at risk for infections from sex (STIs). Treating this infection can lower your risk for some STIs. You should also treat this if you are pregnant. It can cause your baby to be born early. Follow these instructions at home: Medicines  Take over-the-counter and prescription medicines only as told by your doctor.  Take or use your antibiotic medicine as told by your doctor. Do not stop taking or using it even if you start to feel better. General instructions  If you your sexual partner is a woman, tell her that you have this infection. She needs to get treatment if she has symptoms. If you have a female partner, he does not need to be treated.  During treatment: ? Avoid sex. ? Do not douche. ? Avoid alcohol as told. ? Avoid breastfeeding as told.  Drink enough fluid to keep your pee (urine) clear or pale yellow.  Keep your vagina and butt (rectum) clean. ? Wash the area with warm water every day. ? Wipe from front to back after you use the toilet.  Keep all follow-up visits as told by your doctor. This is important. Preventing this condition  Do not douche.  Use only warm water to wash around your vagina.  Use protection when you have sex. This includes: ? Latex condoms. ? Dental dams.  Limit how many people you have sex with. It is  best to only have sex with the same person (be monogamous).  Get tested for STIs. Have your partner get tested.  Wear underwear that is cotton or lined with cotton.  Avoid tight pants and pantyhose. This is most important in summer.  Do not use any products that have nicotine or tobacco in them. These include cigarettes and e-cigarettes. If you need help quitting, ask your doctor.  Do not use illegal drugs.  Limit how much alcohol you drink. Contact a doctor if:  Your symptoms do not get better, even after you are treated.  You have more discharge or pain when you pee (urinate).  You have a fever.  You have pain in your belly (abdomen).  You have pain with sex.  Your bleed from your vagina between periods. Summary  This infection happens when too many germs (bacteria) grow in the vagina.  Treating this condition can lower your risk for some infections from sex (STIs).  You should also treat this if you are pregnant. It can cause early (premature) birth.  Do not stop taking or using your antibiotic medicine even if you start to feel better. This information is not intended to replace advice given to you by your health care provider. Make sure you discuss any questions you have with your health care provider. Document Released: 07/04/2008 Document Revised: 06/10/2016 Document Reviewed: 06/10/2016 Elsevier Interactive Patient Education  2017 Elsevier  Inc.  

## 2017-11-08 NOTE — Assessment & Plan Note (Signed)
Symptoms and wet prep consistent with BV. Treat with metronidazole. GC/chlamydia pending.

## 2017-11-09 LAB — CERVICOVAGINAL ANCILLARY ONLY
Chlamydia: NEGATIVE
Neisseria Gonorrhea: NEGATIVE

## 2017-11-10 ENCOUNTER — Encounter: Payer: Self-pay | Admitting: Family Medicine

## 2017-11-15 ENCOUNTER — Encounter: Payer: Self-pay | Admitting: Podiatry

## 2017-11-15 ENCOUNTER — Ambulatory Visit: Payer: Medicaid Other | Admitting: Podiatry

## 2017-11-15 DIAGNOSIS — L600 Ingrowing nail: Secondary | ICD-10-CM | POA: Diagnosis not present

## 2017-11-15 MED ORDER — CEPHALEXIN 500 MG PO CAPS: 500.0000 mg | ORAL_CAPSULE | Freq: Three times a day (TID) | ORAL | 0 refills | Status: DC

## 2017-11-15 NOTE — Patient Instructions (Signed)

## 2017-11-16 ENCOUNTER — Telehealth: Payer: Self-pay | Admitting: Podiatry

## 2017-11-16 ENCOUNTER — Encounter: Payer: Self-pay | Admitting: Podiatry

## 2017-11-16 ENCOUNTER — Ambulatory Visit: Payer: Medicaid Other | Admitting: Podiatry

## 2017-11-16 DIAGNOSIS — M79674 Pain in right toe(s): Secondary | ICD-10-CM

## 2017-11-16 DIAGNOSIS — L600 Ingrowing nail: Secondary | ICD-10-CM

## 2017-11-16 MED ORDER — HYDROCODONE-ACETAMINOPHEN 5-325 MG PO TABS: 1.0000 | ORAL_TABLET | Freq: Four times a day (QID) | ORAL | 0 refills | Status: DC | PRN

## 2017-11-16 NOTE — Telephone Encounter (Signed)
Chapman FitchShaquanna Brown. [phone].

## 2017-11-16 NOTE — Telephone Encounter (Signed)
Pt called and stated she had an ingrown removed yesterday and is in severe pain.She has had them done previously and has never had this much pain.She stated she has called a couple of times.

## 2017-11-16 NOTE — Telephone Encounter (Signed)
I spoke with pt and it sounded like she had been crying a long time. I offered pt an appt and checked with Dr. Ardelle AntonWagoner and he said that would be good. I transferred pt to schedulers for an appt today.

## 2017-11-19 NOTE — Progress Notes (Signed)
Subjective: Ms. Etheleen MayhewBrown-McLean presents the office as an appointment for her right big toenail hurting.  She states that over the last couple weeks has been getting worse is been ongoing for about 2 weeks.  She says it was hurting very bad this morning.  She denies any drainage or pus.  She previously did have a partial nail avulsion on the lateral aspect last year.  She denies any recent injury or trauma.  She has no other concerns today. Denies any systemic complaints such as fevers, chills, nausea, vomiting. No acute changes since last appointment, and no other complaints at this time.   Objective: AAO x3, NAD DP/PT pulses palpable bilaterally, CRT less than 3 seconds There does appear to be evidence of new nail formation on the proximal lateral aspect the right hallux toenail and there is quite a bit of tenderness to palpation of this area.  There is no drainage or pus.  There is no significant edema, erythema or any clinical signs of infection noted.  There is no pain to the medial nail border and there is no other area of tenderness identified to bilateral lower extremities. No pain with calf compression, swelling, warmth, erythema  Assessment: 32 year old female  right lateral hallux toenail  Plan: -All treatment options discussed with the patient including all alternatives, risks, complications.  -We discussed treatment options.  This time she wishes to go ahead and proceed with a partial nail avulsion with chemical matricectomy to the right lateral hallux nail corner.  The tenderness is along the small lateral nail corner there is no significant tenderness along the mid substance of the nail. -At this time, the patient is requesting partial nail removal with chemical matricectomy to the symptomatic portion of the nail. Risks and complications were discussed with the patient for which they understand and written consent was obtained for the procedure. Under sterile conditions a total of 3 mL of  a mixture of 2% lidocaine plain and 0.5% Marcaine plain was infiltrated in a hallux block fashion. Once anesthetized, the skin was prepped in sterile fashion. A tourniquet was then applied. Next the lateral aspect of hallux nail border was then sharply excised making sure to remove the entire offending nail border. Once the nail were ensured to be removed area was debrided and the underlying skin was intact. There is no purulence identified in the procedure. Next phenol was then applied under standard conditions and copiously irrigated. Silvadene was applied. A dry sterile dressing was applied. After application of the dressing the tourniquet was removed and there is found to be an immediate capillary refill time to the digit. The patient tolerated the procedure well any complications. Post procedure instructions were discussed the patient for which he verbally understood. Follow-up in one week for nail check or sooner if any problems are to arise. Discussed signs/symptoms of infection and directed to call the office immediately should any occur or go directly to the emergency room. In the meantime, encouraged to call the office with any questions, concerns, changes symptoms. -Keflex -Patient encouraged to call the office with any questions, concerns, change in symptoms.   Vivi BarrackMatthew R Wagoner DPM

## 2017-11-21 ENCOUNTER — Encounter: Payer: Medicaid Other | Admitting: Podiatry

## 2017-11-21 NOTE — Progress Notes (Signed)
Subjective: [name] presents the office today she started having increasing pain to the big toe on the right side this morning around 8 AM.  She states that she did take the bandage off she says in Epsom salts was made her hurt worse.  She states the pain is worse than what it was yesterday.  The procedure was performed yesterday morning.  Other than the pain she has no other systemic complaints such as fevers, chills, nausea, vomiting. No acute changes since last appointment, and no other complaints at this time.   Objective: AAO x3, NAD DP/PT pulses palpable bilaterally, CRT less than 3 seconds Status post partial nail avulsion along the lateral aspect of the right hallux toenail.  There is no bleeding present there is no purulence or drainage identified today.  There is no significant edema there is no surrounding erythema, ascending sialitis.  There is tenderness palpation on the proximal nail border on the area the procedure.  There is no other areas of tenderness identified. No open lesions or pre-ulcerative lesions.  No pain with calf compression, swelling, warmth, erythema  Assessment: 32 year old female presents the office today for concerns of pain status post partial nail avulsion which was performed yesterday  Plan: -All treatment options discussed with the patient including all alternatives, risks, complications.  -I remove the bandage today and the procedure site appears to be doing well there is no clinical signs of infection but I want her to continue with antibiotics.  I did prescribe Vicodin for pain for her.  We will switch to antibacterial soap soaks and to give her some soap today to use.  Also antibiotic ointment and a bandage to the area daily.  Discussed she can get Neosporin with pain relief to help with the pain as well.  I dispensed a surgical shoe to offload the area.  Also discussed the not to wrap the toe too tightly as she was rubbing Koflex on the toe and it  was tight on the toe. -Monitor for any clinical signs or symptoms of infection and directed to call the office immediately should any occur or go to the ER. -Follow-up in 1 week or sooner if needed. -Patient encouraged to call the office with any questions, concerns, change in symptoms.   Vivi BarrackMatthew R Jennalynn Rivard DPM

## 2017-11-27 NOTE — Progress Notes (Signed)
No show

## 2017-11-30 ENCOUNTER — Ambulatory Visit: Payer: Medicaid Other | Admitting: Podiatry

## 2017-11-30 ENCOUNTER — Telehealth: Payer: Self-pay | Admitting: *Deleted

## 2017-11-30 NOTE — Telephone Encounter (Signed)
Spoke with the patient today and stated that patient missed her appointment today and patient stated that she thought it was next week and the right big toenail was doing good and I stated that if any concerns or questions to call the office. Misty StanleyLisa

## 2017-12-26 ENCOUNTER — Encounter (HOSPITAL_COMMUNITY): Payer: Self-pay | Admitting: Emergency Medicine

## 2017-12-26 ENCOUNTER — Ambulatory Visit (HOSPITAL_COMMUNITY)
Admission: EM | Admit: 2017-12-26 | Discharge: 2017-12-26 | Disposition: A | Discharge status: Home / Self Care | Payer: Medicaid Other | Attending: Family Medicine | Admitting: Family Medicine

## 2017-12-26 ENCOUNTER — Other Ambulatory Visit: Payer: Self-pay

## 2017-12-26 DIAGNOSIS — N898 Other specified noninflammatory disorders of vagina: Secondary | ICD-10-CM | POA: Diagnosis not present

## 2017-12-26 LAB — POCT PREGNANCY, URINE: PREG TEST UR: NEGATIVE

## 2017-12-26 MED ORDER — METRONIDAZOLE 0.75 % VA GEL: 1.0000 | Freq: Two times a day (BID) | VAGINAL | 0 refills | Status: AC

## 2017-12-26 MED ORDER — FLUCONAZOLE 150 MG PO TABS: 150.0000 mg | ORAL_TABLET | Freq: Every day | ORAL | 0 refills | Status: DC

## 2017-12-26 NOTE — ED Provider Notes (Signed)
[facility]    CSN: [id number] Arrival date & time: 12/26/17  1714     History   Chief Complaint Chief Complaint  Patient presents with  . Vaginal Discharge    HPI Nancy Young is a 32 y.o. female.   32 year old female comes in for 2-day history of vaginal discharge, itching, irritation.  Describes the discharge is thick with fishy odor.  Used Monistat without improvement.  Denies urinary symptoms such as frequency, dysuria, hematuria.  Denies abdominal pain, nausea, vomiting.  Denies fever, chills, night sweats.  Sexually active with one female partner, condom use.  LMP 11/25/2017, states she has irregular cycles.      Past Medical History:  Diagnosis Date  . Obesity   . Vaginal discharge 07/27/2017    Patient Active Problem List   Diagnosis Date Noted  . Bacterial vaginosis 07/27/2017  . Health care maintenance 07/02/2017  . Obesity 05/22/2017    Past Surgical History:  Procedure Laterality Date  . LEEP  2013    OB History    No data available       Home Medications    Prior to Admission medications   Medication Sig Start Date End Date Taking? Authorizing Provider  benzonatate (TESSALON) 100 MG capsule Take 1 capsule (100 mg total) by mouth every 8 (eight) hours. 09/09/17   Blue, Olivia C, PA-C  cephALEXin (KEFLEX) 500 MG capsule Take 1 capsule (500 mg total) by mouth 3 (three) times daily. 11/15/17   Vivi Barrack, DPM  fluconazole (DIFLUCAN) 150 MG tablet Take 1 tablet (150 mg total) by mouth daily. Take second dose 72 hours later if symptoms still persists. 12/26/17   Cathie Hoops, Amy V, PA-C  HYDROcodone-acetaminophen (NORCO/VICODIN) 5-325 MG tablet Take 1-2 tablets by mouth every 6 (six) hours as needed. 11/16/17   Vivi Barrack, DPM  metroNIDAZOLE (METROGEL VAGINAL) 0.75 % vaginal gel Place 1 Applicatorful vaginally 2 (two) times daily for 5 days. 12/26/17 12/31/17  Belinda Fisher, PA-C    Family History Family History  Problem Relation  Age of Onset  . Diabetes Mother   . Hypertension Mother   . Hyperlipidemia Mother   . Bipolar disorder Mother   . Diabetes Maternal Grandmother   . Sickle cell anemia Maternal Grandmother   . Heart attack Maternal Grandmother   . Anxiety disorder Maternal Grandmother   . Hypertension Maternal Grandmother   . Hyperlipidemia Maternal Grandmother   . Stroke Maternal Grandmother   . Hepatitis Maternal Grandfather   . Lung disease Maternal Grandfather   . Diabetes Paternal Grandmother   . Hypertension Paternal Grandmother   . Hyperlipidemia Paternal Grandmother   . Kidney disease Maternal Aunt   . Diabetes Maternal Aunt   . Hypertension Maternal Aunt   . Stomach cancer Other     Social History Social History   Tobacco Use  . Smoking status: Never Smoker  . Smokeless tobacco: Never Used  Substance Use Topics  . Alcohol use: Yes    Comment: 1-2 drinks per 2-3 times/week  . Drug use: No     Allergies   Patient has no known allergies.   Review of Systems Review of Systems  Reason unable to perform ROS: See HPI as above.     Physical Exam Triage Vital Signs ED Triage Vitals  Enc Vitals Group     BP 12/26/17 1839 116/80     Pulse Rate 12/26/17 1839 77     Resp 12/26/17 1839 17  Temp 12/26/17 1839 98.2 F (36.8 C)     Temp Source 12/26/17 1839 Oral     SpO2 12/26/17 1839 100 %     Weight 12/26/17 1837 212 lb 4 oz (96.3 kg)     Height 12/26/17 1837 5\' 3"  (1.6 m)     Head Circumference --      Peak Flow --      Pain Score 12/26/17 1837 0     Pain Loc --      Pain Edu? --      Excl. in GC? --    No data found.  Updated Vital Signs BP 116/80 (BP Location: Left Arm)   Pulse 77   Temp 98.2 F (36.8 C) (Oral)   Resp 17   Ht 5\' 3"  (1.6 m)   Wt 212 lb 4 oz (96.3 kg)   LMP 11/25/2017   SpO2 100%   BMI 37.60 kg/m   Physical Exam  Constitutional: She is oriented to person, place, and time. She appears well-developed and well-nourished. No distress.  HENT:   Head: Normocephalic and atraumatic.  Eyes: Conjunctivae are normal. Pupils are equal, round, and reactive to light.  Neurological: She is alert and oriented to person, place, and time.     UC Treatments / Results  Labs (all labs ordered are listed, but only abnormal results are displayed) Labs Reviewed  POCT PREGNANCY, URINE  URINE CYTOLOGY ANCILLARY ONLY    EKG  EKG Interpretation None       Radiology No results found.  Procedures Procedures (including critical care time)  Medications Ordered in UC Medications - No data to display   Initial Impression / Assessment and Plan / UC Course  I have reviewed the triage vital signs and the nursing notes.  Pertinent labs & imaging results that were available during my care of the patient were reviewed by me and considered in my medical decision making (see chart for details).    Urine pregnancy negative. Patient was treated empirically for BV and yeast. Start Diflucan as directed, MetroGel as directed.  Cytology sent, patient will be contacted with any positive results that require additional treatment. Patient to refrain from sexual activity for the next 7 days. Return precautions given.    Final Clinical Impressions(s) / UC Diagnoses   Final diagnoses:  Vaginal discharge    ED Discharge Orders        Ordered    fluconazole (DIFLUCAN) 150 MG tablet  Daily     12/26/17 1903    metroNIDAZOLE (METROGEL VAGINAL) 0.75 % vaginal gel  2 times daily     12/26/17 1903        Belinda FisherYu, Amy V, Cordelia Poche-C 12/26/17 1955

## 2017-12-26 NOTE — Discharge Instructions (Signed)
You were treated empirically for yeast and bacterial vaginitis. Metrogel and diflucan as directed.  Cytology sent, you will be contacted with any positive results that requires further treatment. Refrain from sexual activity and alcohol use for the next 7 days. Monitor for any worsening of symptoms, fever, abdominal pain, nausea, vomiting, to follow up for reevaluation.

## 2017-12-26 NOTE — ED Triage Notes (Signed)
Pt. Stated, I think I have a yeast infection, started 2 days ago.

## 2017-12-27 LAB — URINE CYTOLOGY ANCILLARY ONLY
Chlamydia: NEGATIVE
NEISSERIA GONORRHEA: NEGATIVE
TRICH (WINDOWPATH): NEGATIVE

## 2017-12-28 LAB — URINE CYTOLOGY ANCILLARY ONLY
BACTERIAL VAGINITIS: POSITIVE — AB
Candida vaginitis: NEGATIVE

## 2017-12-31 ENCOUNTER — Other Ambulatory Visit: Payer: Self-pay

## 2017-12-31 ENCOUNTER — Ambulatory Visit (INDEPENDENT_AMBULATORY_CARE_PROVIDER_SITE_OTHER): Payer: Medicaid Other | Admitting: Family Medicine

## 2017-12-31 ENCOUNTER — Encounter: Payer: Self-pay | Admitting: Family Medicine

## 2017-12-31 VITALS — BP 116/76 | HR 89 | Temp 98.3°F | Ht 63.0 in | Wt 205.8 lb

## 2017-12-31 DIAGNOSIS — G44209 Tension-type headache, unspecified, not intractable: Secondary | ICD-10-CM | POA: Diagnosis not present

## 2017-12-31 DIAGNOSIS — N898 Other specified noninflammatory disorders of vagina: Secondary | ICD-10-CM | POA: Diagnosis present

## 2017-12-31 DIAGNOSIS — Z3202 Encounter for pregnancy test, result negative: Secondary | ICD-10-CM | POA: Diagnosis not present

## 2017-12-31 LAB — POCT URINALYSIS DIP (MANUAL ENTRY)
Bilirubin, UA: NEGATIVE
GLUCOSE UA: NEGATIVE mg/dL
LEUKOCYTES UA: NEGATIVE
NITRITE UA: NEGATIVE
Spec Grav, UA: >=1.03 — AB (ref 1.010–1.025)
UROBILINOGEN UA: 1 U/dL
pH, UA: 6 (ref 5.0–8.0)

## 2017-12-31 LAB — POCT UA - MICROSCOPIC ONLY

## 2017-12-31 LAB — POCT WET PREP (WET MOUNT)
CLUE CELLS WET PREP WHIFF POC: NEGATIVE
TRICHOMONAS WET PREP HPF POC: ABSENT

## 2017-12-31 LAB — POCT URINE PREGNANCY: Preg Test, Ur: NEGATIVE

## 2017-12-31 MED ORDER — METRONIDAZOLE 500 MG PO TABS: 500.0000 mg | ORAL_TABLET | Freq: Two times a day (BID) | ORAL | 0 refills | Status: AC

## 2017-12-31 MED ORDER — KETOROLAC TROMETHAMINE 30 MG/ML IJ SOLN
30.0000 mg | Freq: Once | INTRAMUSCULAR | Status: AC
  Administered 2017-12-31: 30 mg via INTRAMUSCULAR

## 2017-12-31 NOTE — Progress Notes (Signed)
   CC: vaginal discharge  HPI  Vaginal discharge - went to urgent care and was prescribed metrogel + diflucan on 3/20, using metrogel BID since rx. Took diflucan x2. Symptoms include irritation. LMP started today. HA today as well. No new sexual partners or concerns for STIS.   HA - normally doesn't have headaches. Tearful. Has been drinking water. Took motrin and tylenol (last at about 4:30am). Normally minimal caffiene use. Bilateral temples. Feels throbbing. Couldn't go to work today. Feels her balance is a little off. Been in bed today. Hasn't eaten as much as normal, tried to drink water. +photophobia.   ROS: denies CP, SOB, abd pain, dysuria, changes in BMs.   CC, SH/smoking status, and VS noted  Objective: BP 116/76   Pulse 89   Temp 98.3 F (36.8 C) (Oral)   Ht 5\' 3"  (1.6 m)   Wt 205 lb 12.8 oz (93.4 kg)   LMP 12/28/2017 (Exact Date)   SpO2 99%   BMI 36.46 kg/m  Gen: NAD, alert, cooperative, and pleasant. HEENT: NCAT, EOMI, PERRL CV: RRR, no murmur Resp: CTAB, no wheezes, non-labored Abd: SNTND, BS present, no guarding or organomegaly GU: normal external female genitalia, mild excoriations over L labia minor. No CMT or discharge from OS.  Neuro: Alert and oriented, Speech clear, No gross deficits  Assessment and plan:  Vaginal discharge: patient with negative wet prep, but persistent discharge. Will treat as Metrogel failure and give PO metronidazole. Counseled on fragrance free soaps, avoiding douching or internal use of soap. Return if not improving.   Headache: likely tension type, will try IM toradol and continue hydration, tylenol at home.   Orders Placed This Encounter  Procedures  . POCT urinalysis dipstick  . POCT urine pregnancy  . POCT UA - Microscopic Only  . POCT Wet Prep [facility] [place])    Meds ordered this encounter  Medications  . ketorolac (TORADOL) 30 MG/ML injection 30 mg  . metroNIDAZOLE (FLAGYL) 500 MG tablet    Sig: Take 1 tablet (500 mg  total) by mouth 2 (two) times daily for 7 days.    Dispense:  14 tablet    Refill:  0     Loni MuseKate Arissa Fagin, MD, PGY2 01/03/2018 11:58 AM

## 2017-12-31 NOTE — Patient Instructions (Signed)
It was a pleasure to see you today! Thank you for choosing [facility] for your primary care. Nancy Young was seen for vaginal itching, headache.  Our plans for today were:  Take the oral medicine for your itching. Do NOT DRINK ALCOHOL when you take this.   Drink lots of water for your headache. Try tylenol as needed.   Best,  Dr. Chanetta Marshallimberlake

## 2018-01-18 ENCOUNTER — Encounter (HOSPITAL_COMMUNITY): Payer: Self-pay | Admitting: Family Medicine

## 2018-01-18 ENCOUNTER — Ambulatory Visit (HOSPITAL_COMMUNITY)
Admission: EM | Admit: 2018-01-18 | Discharge: 2018-01-18 | Disposition: A | Discharge status: Home / Self Care | Payer: Medicaid Other | Attending: Urgent Care | Admitting: Urgent Care

## 2018-01-18 DIAGNOSIS — Z79899 Other long term (current) drug therapy: Secondary | ICD-10-CM | POA: Diagnosis not present

## 2018-01-18 DIAGNOSIS — R102 Pelvic and perineal pain: Secondary | ICD-10-CM

## 2018-01-18 DIAGNOSIS — N898 Other specified noninflammatory disorders of vagina: Secondary | ICD-10-CM

## 2018-01-18 DIAGNOSIS — R300 Dysuria: Secondary | ICD-10-CM | POA: Diagnosis present

## 2018-01-18 DIAGNOSIS — B373 Candidiasis of vulva and vagina: Secondary | ICD-10-CM | POA: Diagnosis not present

## 2018-01-18 DIAGNOSIS — N760 Acute vaginitis: Secondary | ICD-10-CM

## 2018-01-18 LAB — POCT URINALYSIS DIP (DEVICE)
Bilirubin Urine: NEGATIVE
GLUCOSE, UA: NEGATIVE mg/dL
Hgb urine dipstick: NEGATIVE
Nitrite: NEGATIVE
PROTEIN: NEGATIVE mg/dL
UROBILINOGEN UA: 0.2 mg/dL (ref 0.0–1.0)
pH: 5.5 (ref 5.0–8.0)

## 2018-01-18 MED ORDER — FLUCONAZOLE 150 MG PO TABS: 150.0000 mg | ORAL_TABLET | ORAL | 0 refills | Status: DC

## 2018-01-18 MED ORDER — AZITHROMYCIN 250 MG PO TABS
1000.0000 mg | ORAL_TABLET | Freq: Once | ORAL | Status: AC
  Administered 2018-01-18: 1000 mg via ORAL

## 2018-01-18 MED ORDER — AZITHROMYCIN 250 MG PO TABS
ORAL_TABLET | ORAL | Status: AC
  Filled 2018-01-18: qty 4

## 2018-01-18 MED ORDER — CEFTRIAXONE SODIUM 250 MG IJ SOLR
INTRAMUSCULAR | Status: AC
Start: 2018-01-18 — End: ?
  Filled 2018-01-18: qty 250

## 2018-01-18 MED ORDER — CEFTRIAXONE SODIUM 250 MG IJ SOLR
250.0000 mg | Freq: Once | INTRAMUSCULAR | Status: AC
  Administered 2018-01-18: 250 mg via INTRAMUSCULAR

## 2018-01-18 NOTE — ED Provider Notes (Signed)
  MRN: [id number] DOB: August 03, 1986  Subjective:   Nancy Young is a 32 y.o. female presenting for ~4 week history of persistent vaginal discharge, vaginal pain, redness, dysuria. She has taken metrogel, diflucan followed by Flagyl. She states that she thinks she was tested for everything but feels like her skin is sloughing off. Denies fever, n/v, abdominal pain, pelvic pain. Denies smoking cigarettes.  No current facility-administered medications for this encounter.   Current Outpatient Medications:  .  benzonatate (TESSALON) 100 MG capsule, Take 1 capsule (100 mg total) by mouth every 8 (eight) hours., Disp: 21 capsule, Rfl: 0 .  fluconazole (DIFLUCAN) 150 MG tablet, Take 1 tablet (150 mg total) by mouth daily. Take second dose 72 hours later if symptoms still persists., Disp: 2 tablet, Rfl: 0   No Known Allergies  Past Medical History:  Diagnosis Date  . Obesity   . Vaginal discharge 07/27/2017     Past Surgical History:  Procedure Laterality Date  . LEEP  2013    Objective:   Vitals: BP 135/85 (BP Location: Left Arm)   Pulse 98   Temp 98 F (36.7 C) (Oral)   Resp 18   LMP 12/28/2017 (Exact Date)   SpO2 100%   Physical Exam  Constitutional: She is oriented to person, place, and time. She appears well-developed and well-nourished.  Cardiovascular: Normal rate.  Pulmonary/Chest: Effort normal.  Genitourinary: No labial fusion. There is tenderness on the right labia. There is no rash, lesion or injury on the right labia. There is tenderness on the left labia. There is no rash, lesion or injury on the left labia. Cervix exhibits discharge. Cervix exhibits no motion tenderness and no friability. There is erythema and tenderness in the vagina. No bleeding in the vagina. No foreign body in the vagina. No signs of injury around the vagina. Vaginal discharge found.  Lymphadenopathy: No inguinal adenopathy noted on the right or left side.  Neurological: She is alert and  oriented to person, place, and time.   Results for orders placed or performed during the hospital encounter of 01/18/18 (from the past 24 hour(s))  POCT urinalysis dip (device)     Status: Abnormal   Collection Time: 01/18/18  7:38 PM  Result Value Ref Range   Glucose, UA NEGATIVE NEGATIVE mg/dL   Bilirubin Urine NEGATIVE NEGATIVE   Ketones, ur TRACE (A) NEGATIVE mg/dL   Specific Gravity, Urine >=1.030 1.005 - 1.030   Hgb urine dipstick NEGATIVE NEGATIVE   pH 5.5 5.0 - 8.0   Protein, ur NEGATIVE NEGATIVE mg/dL   Urobilinogen, UA 0.2 0.0 - 1.0 mg/dL   Nitrite NEGATIVE NEGATIVE   Leukocytes, UA TRACE (A) NEGATIVE    Assessment and Plan :   Vaginitis and vulvovaginitis  Vaginal discharge  Vaginal pain  Case precepted with Dr. Rachelle HoraMoss. Will treat with IM cefriaxone, azithromycin. Rx for diflucan as well to address antibiotic associated yeast infection. Patient is to set up an appointment with her gynecologist. Discussed possibility of chafing and recommended wearing loose clothing, hydrating well, using soaps for gentle skin. Return-to-clinic precautions discussed, patient verbalized understanding.    Wallis BambergMani, Krithika Tome, PA-C 01/18/18 2050

## 2018-01-18 NOTE — Discharge Instructions (Addendum)
Make sure you set up an appointment with gynecologist for further work up. Today we will treat you for gonorrhea and chlamydia due to the vaginal pain you are having, vaginal discharge. Hydrate well with at least 2 liters (1 gallon) of water daily. Wear loose clothing. Use soap products for gentle skin.

## 2018-01-18 NOTE — ED Triage Notes (Addendum)
Pt here for continued symptoms despite being treated for yeast infection and BV about 2 weeks ago. She finished all the meds. She says that she is red and swollen and burning. Reports discharge. Using dove soap

## 2018-01-19 LAB — URINE CULTURE: Culture: 50000 — AB

## 2018-01-21 ENCOUNTER — Telehealth (HOSPITAL_COMMUNITY): Payer: Self-pay

## 2018-01-21 LAB — CERVICOVAGINAL ANCILLARY ONLY
Bacterial vaginitis: NEGATIVE
CANDIDA VAGINITIS: POSITIVE — AB
CHLAMYDIA, DNA PROBE: NEGATIVE
NEISSERIA GONORRHEA: NEGATIVE
TRICH (WINDOWPATH): NEGATIVE

## 2018-01-21 NOTE — Telephone Encounter (Signed)
Candida (yeast) is positive.  Prescription for fluconazole was given at the urgent care visit. Attempted to call patient x 2 no answer.

## 2018-01-21 NOTE — Telephone Encounter (Signed)
Urine culture being positive for lactobacillus germ, which is part of the normal urogenital germ flora and does not represent a UTI. Attempted to call patient, no answer.

## 2018-02-16 ENCOUNTER — Other Ambulatory Visit: Payer: Self-pay

## 2018-02-16 ENCOUNTER — Emergency Department (HOSPITAL_COMMUNITY): Payer: Medicaid Other

## 2018-02-16 ENCOUNTER — Encounter (HOSPITAL_COMMUNITY): Payer: Self-pay | Admitting: Emergency Medicine

## 2018-02-16 ENCOUNTER — Emergency Department (HOSPITAL_BASED_OUTPATIENT_CLINIC_OR_DEPARTMENT_OTHER)
Admit: 2018-02-16 | Discharge: 2018-02-16 | Disposition: A | Discharge status: Home / Self Care | Payer: Medicaid Other | Attending: Emergency Medicine | Admitting: Emergency Medicine

## 2018-02-16 ENCOUNTER — Emergency Department (HOSPITAL_COMMUNITY)
Admission: EM | Admit: 2018-02-16 | Discharge: 2018-02-16 | Disposition: A | Discharge status: Home / Self Care | Payer: Medicaid Other | Attending: Emergency Medicine | Admitting: Emergency Medicine

## 2018-02-16 DIAGNOSIS — M79609 Pain in unspecified limb: Secondary | ICD-10-CM | POA: Diagnosis not present

## 2018-02-16 DIAGNOSIS — R2242 Localized swelling, mass and lump, left lower limb: Secondary | ICD-10-CM | POA: Diagnosis not present

## 2018-02-16 DIAGNOSIS — M25562 Pain in left knee: Secondary | ICD-10-CM | POA: Diagnosis not present

## 2018-02-16 MED ORDER — IBUPROFEN 200 MG PO TABS
600.0000 mg | ORAL_TABLET | Freq: Once | ORAL | Status: AC
  Administered 2018-02-16: 10:00:00 600 mg via ORAL
  Filled 2018-02-16: qty 1

## 2018-02-16 MED ORDER — ACETAMINOPHEN 500 MG PO TABS: 500.0000 mg | ORAL_TABLET | Freq: Four times a day (QID) | ORAL | 0 refills | Status: DC | PRN

## 2018-02-16 MED ORDER — IBUPROFEN 600 MG PO TABS: 600.0000 mg | ORAL_TABLET | Freq: Four times a day (QID) | ORAL | 0 refills | Status: DC | PRN

## 2018-02-16 NOTE — ED Triage Notes (Addendum)
C/o L knee pain with swelling x 1 month- no known injury.  States pain is constant and nothing makes it better.  Denies fever, redness or warmth.

## 2018-02-16 NOTE — Progress Notes (Signed)
*  Preliminary Results* Left lower extremity venous duplex completed. Left lower extremity is negative for deep vein thrombosis. There is no evidence of left Baker's cyst.  02/16/2018 11:46 AM  Gertie Fey, BS, RVT, RDCS, RDMS

## 2018-02-16 NOTE — Discharge Instructions (Signed)
For your knee sleeve at all times, especially with walking.  Keep your leg elevated whenever you are not walking.  Take ibuprofen every 6 hours as prescribed and alternate with Tylenol.  Use ice 3-4 times daily alternating 20 minutes on, 20 minutes off.  Please follow-up with orthopedic doctor below or with the doctor you have an appointment with for further evaluation and treatment.  Please return to the emergency department if you develop any new or worsening symptoms.

## 2018-02-16 NOTE — ED Provider Notes (Signed)
[facility] EMERGENCY DEPARTMENT Provider Note   CSN: [id number] Arrival date & time: 02/16/18  0113     History   Chief Complaint Chief Complaint  Patient presents with  . Knee Pain    HPI Nancy Young is a 32 y.o. female with history of obesity, bacterial vaginosis who presents with a 3 to 4-week history of left knee pain. She has pain to the lateral aspect of her knee.  She occasionally has some radiation toward her ankle, but no ankle pain.  Patient denies any injury.  Her pain is worse with movement, is walking, and locking her knee.  She has tried ice and elevation at home without relief.  She has no history of knee problems.  She denies any fevers, numbness or tingling, chest pain, shortness of breath, abdominal pain, nausea, vomiting, urinary symptoms.  HPI  Past Medical History:  Diagnosis Date  . Obesity   . Vaginal discharge 07/27/2017    Patient Active Problem List   Diagnosis Date Noted  . Bacterial vaginosis 07/27/2017  . Health care maintenance 07/02/2017  . Obesity 05/22/2017    Past Surgical History:  Procedure Laterality Date  . LEEP  2013     OB History   None      Home Medications    Prior to Admission medications   Medication Sig Start Date End Date Taking? Authorizing Provider  acetaminophen (TYLENOL) 500 MG tablet Take 1 tablet (500 mg total) by mouth every 6 (six) hours as needed. 02/16/18   Zelig Gacek, Waylan Boga, PA-C  fluconazole (DIFLUCAN) 150 MG tablet Take 1 tablet (150 mg total) by mouth once a week. 01/18/18   Wallis Bamberg, PA-C  ibuprofen (ADVIL,MOTRIN) 600 MG tablet Take 1 tablet (600 mg total) by mouth every 6 (six) hours as needed. 02/16/18   Emi Holes, PA-C    Family History Family History  Problem Relation Age of Onset  . Diabetes Mother   . Hypertension Mother   . Hyperlipidemia Mother   . Bipolar disorder Mother   . Diabetes Maternal Grandmother   . Sickle cell anemia Maternal Grandmother     . Heart attack Maternal Grandmother   . Anxiety disorder Maternal Grandmother   . Hypertension Maternal Grandmother   . Hyperlipidemia Maternal Grandmother   . Stroke Maternal Grandmother   . Hepatitis Maternal Grandfather   . Lung disease Maternal Grandfather   . Diabetes Paternal Grandmother   . Hypertension Paternal Grandmother   . Hyperlipidemia Paternal Grandmother   . Kidney disease Maternal Aunt   . Diabetes Maternal Aunt   . Hypertension Maternal Aunt   . Stomach cancer Other     Social History Social History   Tobacco Use  . Smoking status: Never Smoker  . Smokeless tobacco: Never Used  Substance Use Topics  . Alcohol use: Yes    Comment: 1-2 drinks per 2-3 times/week  . Drug use: No     Allergies   Patient has no known allergies.   Review of Systems Review of Systems  Constitutional: Negative for chills and fever.  HENT: Negative for facial swelling and sore throat.   Respiratory: Negative for shortness of breath.   Cardiovascular: Negative for chest pain.  Gastrointestinal: Negative for abdominal pain, nausea and vomiting.  Genitourinary: Negative for dysuria.  Musculoskeletal: Positive for arthralgias (L knee). Negative for back pain.  Skin: Negative for rash and wound.  Neurological: Negative for headaches.  Psychiatric/Behavioral: The patient is not nervous/anxious.  Physical Exam Updated Vital Signs BP 137/86   Pulse 85   Temp 98.1 F (36.7 C) (Oral)   Resp 18   LMP 02/02/2018   SpO2 100%   Physical Exam  Constitutional: She appears well-developed and well-nourished. No distress.  HENT:  Head: Normocephalic and atraumatic.  Mouth/Throat: Oropharynx is clear and moist. No oropharyngeal exudate.  Eyes: Pupils are equal, round, and reactive to light. Conjunctivae are normal. Right eye exhibits no discharge. Left eye exhibits no discharge. No scleral icterus.  Neck: Normal range of motion. Neck supple. No thyromegaly present.   Cardiovascular: Normal rate, regular rhythm, normal heart sounds and intact distal pulses. Exam reveals no gallop and no friction rub.  No murmur heard. Pulmonary/Chest: Effort normal and breath sounds normal. No stridor. No respiratory distress. She has no wheezes. She has no rales.  Abdominal: Soft. Bowel sounds are normal. She exhibits no distension. There is no tenderness. There is no rebound and no guarding.  Musculoskeletal: She exhibits edema (Edema in the L calf, no tenderness or erythema).  Mild edema noted to the left knee, no warmth or erythema, point tenderness over the lateral joint line; pain with varus stress, no pain with valgus stress, no laxity noted with varus and valgus stress; negative anterior/posterior drawer, pain laterally with McMurray's; point point in the area of tenderness with passive range of motion, however no pain elsewhere; crepitus noted with mobility of the patella Patient can straight leg raise with pain Full range of motion intact with pain No posterior knee or calf tenderness  Lymphadenopathy:    She has no cervical adenopathy.  Neurological: She is alert. Coordination normal.  5/5 strength to bilateral lower extremities, sensation intact  Skin: Skin is warm and dry. No rash noted. She is not diaphoretic. No pallor.  Psychiatric: She has a normal mood and affect.  Nursing note and vitals reviewed.    ED Treatments / Results  Labs (all labs ordered are listed, but only abnormal results are displayed) Labs Reviewed - No data to display  EKG None  Radiology Dg Knee Complete 4 Views Left  Result Date: 02/16/2018 CLINICAL DATA:  Pain in the left knee for 3 weeks.  No trauma. EXAM: LEFT KNEE - COMPLETE 4+ VIEW COMPARISON:  None. FINDINGS: No fracture or dislocation. Minimal medial compartment degenerative changes. No effusion. No other acute abnormality. IMPRESSION: Minimal degenerative changes in the medial compartment. No fracture or effusion.  Electronically Signed   By: Gerome Sam III M.D   On: 02/16/2018 09:42    Procedures Procedures (including critical care time)  Medications Ordered in ED Medications  ibuprofen (ADVIL,MOTRIN) tablet 600 mg (600 mg Oral Given 02/16/18 0930)     Initial Impression / Assessment and Plan / ED Course  I have reviewed the triage vital signs and the nursing notes.  Pertinent labs & imaging results that were available during my care of the patient were reviewed by me and considered in my medical decision making (see chart for details).     Patient with a 1 month history of left knee pain.  She denies any injury.  X-ray shows minimal degenerative changes in the medial compartment, no fracture or effusion.  No warmth or erythema.  Patient has point tenderness and pain.  Suspect soft tissue injury.  Doubt septic joint.  Patient is afebrile.  Will treat supportively with ibuprofen, ice, rest, knee sleeve with follow-up to orthopedics.  Patient is feeling better after ibuprofen in the ED.  Return precautions  discussed.  Patient understands and agrees with plan.  Patient vitals stable throughout ED course and discharged in satisfactory condition.  Final Clinical Impressions(s) / ED Diagnoses   Final diagnoses:  Acute pain of left knee    ED Discharge Orders        Ordered    ibuprofen (ADVIL,MOTRIN) 600 MG tablet  Every 6 hours PRN     02/16/18 0958    acetaminophen (TYLENOL) 500 MG tablet  Every 6 hours PRN     02/16/18 0958       Emi Holes, PA-C 02/16/18 1411    Jacalyn Lefevre, MD 02/16/18 1436

## 2018-02-20 NOTE — Progress Notes (Signed)
[facility] Phone: [phone]   Date of Visit: 02/21/2018   HPI:  Left Knee Pain:  - she was seen in urgent care on 02/16/18. Had xray done which showed minimal degenerative changes in the medial compartment without fracture or effusion. She also had an US done which ruled out a DVT. -Patient reports of left knee pain for the past 4 weeks it is located on the lateral knee.  She describes it as a throbbing and aching pain.  Worse with prolonged walking or standing.  She does not recall any specific injury.  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  Patient reports that her knee do patient to continue es not catch or lock.  She feels like sometimes it gives out.  She  have has not had any falls. -She has tried elevation, ice, Motrin which have not helped. -She went to the ED because her  pain seemed to worsen that day.   - No history of knee injuries or Surgeries  -  She reports of some swelling of the knee as well.  -She denies any fevers or chills  - she reports that since her knee started hurting her calf started to swell as well.  This is why an venous Doppler ultrasound was done at urgent care which was unremarkable.   ROS: See HPI.  PMFSH: PMH:  Obesity PHYSICAL EXAM: BP 116/62   Pulse 74   Ht  (1.6 m)   Wt 208 lb (94.3 kg)   LMP 02/02/2018   SpO2 99%   BMI 36.85 kg/m  GEN: NAD CV: RRR, no murmurs, rubs, or gallops PULM: CTAB, normal effort MSK: Knee: Upon inspection, there isno erythema or obvious bony abnormalities. there is an effusion that is appears to be located in the prepatellar bursa there is no overlying erythema over the skin.  And no significant increased warmth Palpation: Some tenderness to palpation in the lateral joint line.  No tenderness in the medial joint line or the posterior knee some pain to palpation over  the patella and the lateral border of the patella.  ROM normal in flexion and extension however she has pain with full extension  Ligaments with solid consistent endpoints including ACL, PCL, LCL, MCL. Pain with McMurray's on the lateral knee.  Some discomfort with Thessaly's test. Mild tenderness with patellar compression. Patellar and quadriceps tendons unremarkable. Hamstring and quadriceps strength is normal.   Neurovascularly intact Her left calf is mildly larger than her right calf.  There is no pitting edema necessarily.  No calf tenderness  SKIN: No rash or cyanosis; warm and well-perfused PSYCH: Mood and affect euthymic, normal rate and volume of speech NEURO: Awake, alert, no focal deficits grossly, normal speech   ASSESSMENT/PLAN:  Left knee pain: Patient without history of injury or activity modification or overuse.  Her knee x-ray shows minimal degenerative changes in the medial knee.  However this is not where her pain is.  Her pain is mainly in the lateral knee.  Her  exam seems more consistent with irritation of the meniscus.  Unlikely that this is a torn meniscus and she does not have history of injury or locking and catching sensation.  It appears that she also has prepatellar bursitis.  Septic joint is highly unlikely.  Urgent care ruled out DVT with an ultrasound of the  lower extremity.  We will do a trial of steroid burst prednisone 60 mg on day 1, 50 mg day 2, 40 mg day 3, 30 mg day for, 20 mg day for 5, 10 mg day 6.  Elevation and ice.  Provided some gentle exercises.  If her symptoms do not improve with this will consider MRI and/or referral to sports medicine.   Palma Holter, MD PGY 3 [facility]

## 2018-02-21 ENCOUNTER — Other Ambulatory Visit: Payer: Self-pay

## 2018-02-21 ENCOUNTER — Encounter: Payer: Self-pay | Admitting: Internal Medicine

## 2018-02-21 ENCOUNTER — Ambulatory Visit (INDEPENDENT_AMBULATORY_CARE_PROVIDER_SITE_OTHER): Payer: Medicaid Other | Admitting: Internal Medicine

## 2018-02-21 VITALS — BP 116/62 | HR 74 | Ht 63.0 in | Wt 208.0 lb

## 2018-02-21 DIAGNOSIS — M25562 Pain in left knee: Secondary | ICD-10-CM | POA: Diagnosis not present

## 2018-02-21 MED ORDER — PREDNISONE 10 MG PO TABS: ORAL_TABLET | ORAL | 0 refills | Status: DC

## 2018-02-21 NOTE — Patient Instructions (Addendum)
Let's try a course of Steroids to cut down the inflammation. Try the following exercises after you finish the steroid course.    Knee Exercises Ask your health care provider which exercises are safe for you. Do exercises exactly as told by your health care provider and adjust them as directed. It is normal to feel mild stretching, pulling, tightness, or discomfort as you do these exercises, but you should stop right away if you feel sudden pain or your pain gets worse.Do not begin these exercises until told by your health care provider. STRETCHING AND RANGE OF MOTION EXERCISES These exercises warm up your muscles and joints and improve the movement and flexibility of your knee. These exercises also help to relieve pain, numbness, and tingling. Exercise A: Knee Extension, Prone 1. Lie on your abdomen on a bed. 2. Place your left / right knee just beyond the edge of the surface so your knee is not on the bed. You can put a towel under your left / right thigh just above your knee for comfort. 3. Relax your leg muscles and allow gravity to straighten your knee. You should feel a stretch behind your left / right knee. 4. Hold this position for __________ seconds. 5. Scoot up so your knee is supported between repetitions. Repeat __________ times. Complete this stretch __________ times a day. Exercise B: Knee Flexion, Active  1. Lie on your back with both knees straight. If this causes back discomfort, bend your left / right knee so your foot is flat on the floor. 2. Slowly slide your left / right heel back toward your buttocks until you feel a gentle stretch in the front of your knee or thigh. 3. Hold this position for __________ seconds. 4. Slowly slide your left / right heel back to the starting position. Repeat __________ times. Complete this exercise __________ times a day. Exercise C: Quadriceps, Prone  1. Lie on your abdomen on a firm surface, such as a bed or padded floor. 2. Bend your left  / right knee and hold your ankle. If you cannot reach your ankle or pant leg, loop a belt around your foot and grab the belt instead. 3. Gently pull your heel toward your buttocks. Your knee should not slide out to the side. You should feel a stretch in the front of your thigh and knee. 4. Hold this position for __________ seconds. Repeat __________ times. Complete this stretch __________ times a day. Exercise D: Hamstring, Supine 1. Lie on your back. 2. Loop a belt or towel over the ball of your left / right foot. The ball of your foot is on the walking surface, right under your toes. 3. Straighten your left / right knee and slowly pull on the belt to raise your leg until you feel a gentle stretch behind your knee. ? Do not let your left / right knee bend while you do this. ? Keep your other leg flat on the floor. 4. Hold this position for __________ seconds. Repeat __________ times. Complete this stretch __________ times a day. STRENGTHENING EXERCISES These exercises build strength and endurance in your knee. Endurance is the ability to use your muscles for a long time, even after they get tired. Exercise E: Quadriceps, Isometric  1. Lie on your back with your left / right leg extended and your other knee bent. Put a rolled towel or small pillow under your knee if told by your health care provider. 2. Slowly tense the muscles in the front of  your left / right thigh. You should see your kneecap slide up toward your hip or see increased dimpling just above the knee. This motion will push the back of the knee toward the floor. 3. For __________ seconds, keep the muscle as tight as you can without increasing your pain. 4. Relax the muscles slowly and completely. Repeat __________ times. Complete this exercise __________ times a day. Exercise F: Straight Leg Raises - Quadriceps 1. Lie on your back with your left / right leg extended and your other knee bent. 2. Tense the muscles in the front of  your left / right thigh. You should see your kneecap slide up or see increased dimpling just above the knee. Your thigh may even shake a bit. 3. Keep these muscles tight as you raise your leg 4-6 inches (10-15 cm) off the floor. Do not let your knee bend. 4. Hold this position for __________ seconds. 5. Keep these muscles tense as you lower your leg. 6. Relax your muscles slowly and completely after each repetition. Repeat __________ times. Complete this exercise __________ times a day. Exercise G: Hamstring, Isometric 1. Lie on your back on a firm surface. 2. Bend your left / right knee approximately __________ degrees. 3. Dig your left / right heel into the surface as if you are trying to pull it toward your buttocks. Tighten the muscles in the back of your thighs to dig as hard as you can without increasing any pain. 4. Hold this position for __________ seconds. 5. Release the tension gradually and allow your muscles to relax completely for __________ seconds after each repetition. Repeat __________ times. Complete this exercise __________ times a day. Exercise H: Hamstring Curls  If told by your health care provider, do this exercise while wearing ankle weights. Begin with __________ weights. Then increase the weight by 1 lb (0.5 kg) increments. Do not wear ankle weights that are more than __________. 1. Lie on your abdomen with your legs straight. 2. Bend your left / right knee as far as you can without feeling pain. Keep your hips flat against the floor. 3. Hold this position for __________ seconds. 4. Slowly lower your leg to the starting position.  Repeat __________ times. Complete this exercise __________ times a day. Exercise I: Squats (Quadriceps) 1. Stand in front of a table, with your feet and knees pointing straight ahead. You may rest your hands on the table for balance but not for support. 2. Slowly bend your knees and lower your hips like you are going to sit in a  chair. ? Keep your weight over your heels, not over your toes. ? Keep your lower legs upright so they are parallel with the table legs. ? Do not let your hips go lower than your knees. ? Do not bend lower than told by your health care provider. ? If your knee pain increases, do not bend as low. 3. Hold the squat position for __________ seconds. 4. Slowly push with your legs to return to standing. Do not use your hands to pull yourself to standing. Repeat __________ times. Complete this exercise __________ times a day. Exercise J: Wall Slides (Quadriceps)  1. Lean your back against a smooth wall or door while you walk your feet out 18-24 inches (46-61 cm) from it. 2. Place your feet hip-width apart. 3. Slowly slide down the wall or door until your knees bend __________ degrees. Keep your knees over your heels, not over your toes. Keep your knees in line  with your hips. 4. Hold for __________ seconds. Repeat __________ times. Complete this exercise __________ times a day. Exercise K: Straight Leg Raises - Hip Abductors 1. Lie on your side with your left / right leg in the top position. Lie so your head, shoulder, knee, and hip line up. You may bend your bottom knee to help you keep your balance. 2. Roll your hips slightly forward so your hips are stacked directly over each other and your left / right knee is facing forward. 3. Leading with your heel, lift your top leg 4-6 inches (10-15 cm). You should feel the muscles in your outer hip lifting. ? Do not let your foot drift forward. ? Do not let your knee roll toward the ceiling. 4. Hold this position for __________ seconds. 5. Slowly return your leg to the starting position. 6. Let your muscles relax completely after each repetition. Repeat __________ times. Complete this exercise __________ times a day. Exercise L: Straight Leg Raises - Hip Extensors 1. Lie on your abdomen on a firm surface. You can put a pillow under your hips if that is  more comfortable. 2. Tense the muscles in your buttocks and lift your left / right leg about 4-6 inches (10-15 cm). Keep your knee straight as you lift your leg. 3. Hold this position for __________ seconds. 4. Slowly lower your leg to the starting position. 5. Let your leg relax completely after each repetition. Repeat __________ times. Complete this exercise __________ times a day. This information is not intended to replace advice given to you by your health care provider. Make sure you discuss any questions you have with your health care provider. Document Released: 08/09/2005 Document Revised: 06/19/2016 Document Reviewed: 08/01/2015 Elsevier Interactive Patient Education  2018 [employer].

## 2018-02-25 ENCOUNTER — Ambulatory Visit (INDEPENDENT_AMBULATORY_CARE_PROVIDER_SITE_OTHER): Payer: Medicaid Other | Admitting: Orthopaedic Surgery

## 2018-03-05 ENCOUNTER — Encounter (HOSPITAL_COMMUNITY): Payer: Self-pay | Admitting: Emergency Medicine

## 2018-03-05 ENCOUNTER — Ambulatory Visit: Payer: Medicaid Other

## 2018-03-05 ENCOUNTER — Ambulatory Visit (HOSPITAL_COMMUNITY)
Admission: EM | Admit: 2018-03-05 | Discharge: 2018-03-05 | Disposition: A | Discharge status: Home / Self Care | Payer: Medicaid Other | Attending: Family Medicine | Admitting: Family Medicine

## 2018-03-05 DIAGNOSIS — B9689 Other specified bacterial agents as the cause of diseases classified elsewhere: Secondary | ICD-10-CM

## 2018-03-05 DIAGNOSIS — Z3202 Encounter for pregnancy test, result negative: Secondary | ICD-10-CM

## 2018-03-05 DIAGNOSIS — N760 Acute vaginitis: Secondary | ICD-10-CM | POA: Diagnosis not present

## 2018-03-05 DIAGNOSIS — N898 Other specified noninflammatory disorders of vagina: Secondary | ICD-10-CM

## 2018-03-05 LAB — POCT URINALYSIS DIP (DEVICE)
BILIRUBIN URINE: NEGATIVE
Glucose, UA: NEGATIVE mg/dL
KETONES UR: NEGATIVE mg/dL
LEUKOCYTES UA: NEGATIVE
Nitrite: NEGATIVE
Protein, ur: NEGATIVE mg/dL
SPECIFIC GRAVITY, URINE: 1.025 (ref 1.005–1.030)
Urobilinogen, UA: 0.2 mg/dL (ref 0.0–1.0)
pH: 5.5 (ref 5.0–8.0)

## 2018-03-05 LAB — POCT PREGNANCY, URINE: Preg Test, Ur: NEGATIVE

## 2018-03-05 MED ORDER — FLUCONAZOLE 150 MG PO TABS: 150.0000 mg | ORAL_TABLET | ORAL | 1 refills | Status: DC

## 2018-03-05 MED ORDER — METRONIDAZOLE 500 MG PO TABS: 500.0000 mg | ORAL_TABLET | Freq: Two times a day (BID) | ORAL | 1 refills | Status: DC

## 2018-03-05 NOTE — ED Provider Notes (Signed)
[facility]    CSN: [id number] Arrival date & time: 03/05/18  1000     History   Chief Complaint Chief Complaint  Patient presents with  . Vaginal Discharge    HPI Nancy Young is a 32 y.o. female.   HPI  Patient is here for evaluation of vaginal infection.  She has recurring BV.  She states she also has recurring yeast infection.  She denies any risk of pregnancy or sexually transmitted disease.  She has a monogamous female partner.  Her partner is asymptomatic.  She can tell when she has bacterial vaginitis based on odor.  She is also having some itching discomfort.  She thinks she has yeast as well. She questions why she has these recurring infections.  We did discussion regarding the normal bacteria in the vaginal vault and the low pH that was maintained.  Things that can upset this imbalance are reviewed.  Probiotics are recommended. No abdominal pain.  No regular bleeding.  No dysuria.  No fever chills.  Past Medical History:  Diagnosis Date  . Obesity   . Vaginal discharge 07/27/2017    Patient Active Problem List   Diagnosis Date Noted  . Bacterial vaginosis 07/27/2017  . Health care maintenance 07/02/2017  . Obesity 05/22/2017    Past Surgical History:  Procedure Laterality Date  . LEEP  2013    OB History   None      Home Medications    Prior to Admission medications   Medication Sig Start Date End Date Taking? Authorizing Provider  acetaminophen (TYLENOL) 500 MG tablet Take 1 tablet (500 mg total) by mouth every 6 (six) hours as needed. 02/16/18   Law, Waylan Boga, PA-C  fluconazole (DIFLUCAN) 150 MG tablet Take 1 tablet (150 mg total) by mouth once a week. 03/05/18   Eustace Moore, MD  ibuprofen (ADVIL,MOTRIN) 600 MG tablet Take 1 tablet (600 mg total) by mouth every 6 (six) hours as needed. 02/16/18   Law, Waylan Boga, PA-C  metroNIDAZOLE (FLAGYL) 500 MG tablet Take 1 tablet (500 mg total) by mouth 2 (two) times daily. 03/05/18    Eustace Moore, MD  predniSONE (DELTASONE) 10 MG tablet Take  (6 tablets) on day one, then  the next day, then , then , then , then . 02/21/18   Palma Holter, MD    Family History Family History  Problem Relation Age of Onset  . Diabetes Mother   . Hypertension Mother   . Hyperlipidemia Mother   . Bipolar disorder Mother   . Diabetes Maternal Grandmother   . Sickle cell anemia Maternal Grandmother   . Heart attack Maternal Grandmother   . Anxiety disorder Maternal Grandmother   . Hypertension Maternal Grandmother   . Hyperlipidemia Maternal Grandmother   . Stroke Maternal Grandmother   . Hepatitis Maternal Grandfather   . Lung disease Maternal Grandfather   . Diabetes Paternal Grandmother   . Hypertension Paternal Grandmother   . Hyperlipidemia Paternal Grandmother   . Kidney disease Maternal Aunt   . Diabetes Maternal Aunt   . Hypertension Maternal Aunt   . Stomach cancer Other     Social History Social History   Tobacco Use  . Smoking status: Never Smoker  . Smokeless tobacco: Never Used  Substance Use Topics  . Alcohol use: Yes    Comment: 1-2 drinks per 2-3 times/week  . Drug use: No     Allergies   Patient has no known allergies.  Review of Systems Review of Systems  Constitutional: Negative for chills and fever.  HENT: Negative for ear pain and sore throat.   Eyes: Negative for pain and visual disturbance.  Respiratory: Negative for cough and shortness of breath.   Cardiovascular: Negative for chest pain and palpitations.  Gastrointestinal: Negative for abdominal pain and vomiting.  Genitourinary: Positive for vaginal discharge. Negative for dysuria and hematuria.  Musculoskeletal: Negative for arthralgias and back pain.  Skin: Negative for color change and rash.  Neurological: Negative for seizures and syncope.  All other systems reviewed and are negative.    Physical Exam Triage Vital Signs ED Triage Vitals  [03/05/18 1010]  Enc Vitals Group     BP 127/82     Pulse Rate 77     Resp 18     Temp 98.6 F (37 C)     Temp Source Oral     SpO2 96 %     Weight      Height      Head Circumference      Peak Flow      Pain Score      Pain Loc      Pain Edu?      Excl. in GC?    No data found.  Updated Vital Signs BP 127/82 (BP Location: Right Arm)   Pulse 77   Temp 98.6 F (37 C) (Oral)   Resp 18   SpO2 96%   Visual Acuity Right Eye Distance:   Left Eye Distance:   Bilateral Distance:    Right Eye Near:   Left Eye Near:    Bilateral Near:     Physical Exam  Constitutional: She appears well-developed and well-nourished. No distress.  HENT:  Head: Normocephalic and atraumatic.  Mouth/Throat: Oropharynx is clear and moist.  Eyes: Pupils are equal, round, and reactive to light. Conjunctivae are normal.  Neck: Normal range of motion.  Cardiovascular: Normal rate.  Pulmonary/Chest: Effort normal. No respiratory distress.  Abdominal: Soft. She exhibits no distension.  Musculoskeletal: Normal range of motion. She exhibits no edema.  Neurological: She is alert.  Skin: Skin is warm and dry.     UC Treatments / Results  Labs (all labs ordered are listed, but only abnormal results are displayed) Labs Reviewed  POCT URINALYSIS DIP (DEVICE) - Abnormal; Notable for the following components:      Result Value   Hgb urine dipstick TRACE (*)    All other components within normal limits  POCT PREGNANCY, URINE  URINE CYTOLOGY ANCILLARY ONLY    EKG None  Radiology No results found.  Procedures Procedures (including critical care time)  Medications Ordered in UC Medications - No data to display  Initial Impression / Assessment and Plan / UC Course  I have reviewed the triage vital signs and the nursing notes.  Pertinent labs & imaging results that were available during my care of the patient were reviewed by me and considered in my medical decision making (see chart for  details).      Final Clinical Impressions(s) / UC Diagnoses   Final diagnoses:  BV (bacterial vaginosis)  Acute vaginitis     Discharge Instructions     Start on a lactobacillus supplement like activia Treat the BV with metronidazole for 7 days Use the diflucan as directed Follow up with GYN if this does not control the infection   ED Prescriptions    Medication Sig Dispense Auth. Provider   fluconazole (DIFLUCAN) 150 MG tablet Take  1 tablet (150 mg total) by mouth once a week. 2 tablet Eustace Moore, MD   metroNIDAZOLE (FLAGYL) 500 MG tablet Take 1 tablet (500 mg total) by mouth 2 (two) times daily. 14 tablet Eustace Moore, MD     Controlled Substance Prescriptions [place] Controlled Substance Registry consulted? Not Applicable   Eustace Moore, MD 03/05/18 1353

## 2018-03-05 NOTE — ED Triage Notes (Signed)
Pt here for vaginal discharge and thinks she has a bacterial infection

## 2018-03-05 NOTE — Discharge Instructions (Addendum)
Start on a lactobacillus supplement like activia Treat the BV with metronidazole for 7 days Use the diflucan as directed Follow up with GYN if this does not control the infection

## 2018-03-08 ENCOUNTER — Telehealth (HOSPITAL_COMMUNITY): Payer: Self-pay

## 2018-03-08 LAB — URINE CYTOLOGY ANCILLARY ONLY: CANDIDA VAGINITIS: NEGATIVE

## 2018-03-08 NOTE — Telephone Encounter (Signed)
Bacterial Vaginosis test is positive.  Prescription for metronidazole was given at the urgent care visit. Attempted to contact patient. No answer at this time.

## 2018-04-19 ENCOUNTER — Encounter

## 2018-05-03 ENCOUNTER — Other Ambulatory Visit: Payer: Self-pay | Admitting: Family Medicine

## 2018-05-14 ENCOUNTER — Encounter

## 2018-05-14 ENCOUNTER — Other Ambulatory Visit (HOSPITAL_COMMUNITY)
Admission: RE | Admit: 2018-05-14 | Discharge: 2018-05-14 | Disposition: A | Discharge status: Home / Self Care | Payer: Medicaid Other | Source: Ambulatory Visit | Attending: Family Medicine | Admitting: Family Medicine

## 2018-05-14 ENCOUNTER — Ambulatory Visit (INDEPENDENT_AMBULATORY_CARE_PROVIDER_SITE_OTHER): Payer: Medicaid Other | Admitting: Family Medicine

## 2018-05-14 ENCOUNTER — Other Ambulatory Visit: Payer: Self-pay

## 2018-05-14 VITALS — BP 92/72 | HR 76 | Temp 98.1°F | Ht 65.0 in | Wt 207.6 lb

## 2018-05-14 DIAGNOSIS — Z0000 Encounter for general adult medical examination without abnormal findings: Secondary | ICD-10-CM | POA: Diagnosis not present

## 2018-05-14 DIAGNOSIS — Z202 Contact with and (suspected) exposure to infections with a predominantly sexual mode of transmission: Secondary | ICD-10-CM | POA: Diagnosis not present

## 2018-05-14 DIAGNOSIS — N898 Other specified noninflammatory disorders of vagina: Secondary | ICD-10-CM

## 2018-05-14 LAB — POCT WET PREP (WET MOUNT)
CLUE CELLS WET PREP WHIFF POC: NEGATIVE
TRICHOMONAS WET PREP HPF POC: ABSENT
WBC, Wet Prep HPF POC: >20

## 2018-05-14 MED ORDER — FLUCONAZOLE 150 MG PO TABS: 150.0000 mg | ORAL_TABLET | ORAL | 1 refills | Status: DC

## 2018-05-14 MED ORDER — METRONIDAZOLE 500 MG PO TABS: 500.0000 mg | ORAL_TABLET | Freq: Two times a day (BID) | ORAL | 1 refills | Status: DC

## 2018-05-14 NOTE — Patient Instructions (Signed)
It was nice meeting you today Ms. Brown-McLean!  Although your test was negative for yeast infection or BV, your history and exam both indicated that you may have these infections, so I will prescribe metronidazole and diflucan.  I will call you if any of your other tests are abnormal.  If you have any questions or concerns, please feel free to call the clinic.   Be well,  Dr. Frances FurbishWinfrey

## 2018-05-14 NOTE — Assessment & Plan Note (Signed)
Although patient denies any new partners, she requests full STI testing today.  Will obtain GC chlamydia, RPR, and HIV.  She declines pregnancy test since she is only sexually active with a female partner.

## 2018-05-14 NOTE — Assessment & Plan Note (Signed)
Patient requested a Pap smear, but this was performed 1 year ago and was negative for abnormality.  HPV testing was also one year ago, so she should not be due for repeat Pap and HPV testing for another 4 years.

## 2018-05-14 NOTE — Progress Notes (Signed)
   Subjective:    Nancy Young - 32 y.o. female MRN [id number]  Date of birth: [birth year]  HPI  Nancy Young is here for concern for yeast infection or bacterial vaginosis.  She says she has had an increase in the amount of vaginal discharge in the last few weeks and the odor is similar to previous infections.  She says that this discharge is clear in color.  She denies any change in sexual partners, but she would like to be tested for "everything."  She declines pregnancy testing today.  She is sexually active with one female partner.   Health Maintenance:  Health Maintenance Due  Topic Date Due  . INFLUENZA VACCINE  05/09/2018    -  reports that she has never smoked. She has never used smokeless tobacco. - Review of Systems: Per HPI. - Past Medical History: Patient Active Problem List   Diagnosis Date Noted  . Vaginal discharge 05/14/2018  . Bacterial vaginosis 07/27/2017  . Health care maintenance 07/02/2017  . Obesity 05/22/2017  . STD exposure 05/22/2017   - Medications: reviewed and updated   Objective:   Physical Exam BP 92/72   Pulse 76   Temp 98.1 F (36.7 C) (Oral)   Ht 5\' 5"  (1.651 m)   Wt 207 lb 9.6 oz (94.2 kg)   SpO2 98%   BMI 34.55 kg/m  Gen: NAD, alert, cooperative with exam, well-appearing GU: Vulva, vagina, and cervix appear normal.  Thick white discharge is visualized in vaginal canal.  No odor appreciated. Skin: no rashes, normal turgor  Neuro: no gross deficits.  Psych: good insight, alert and oriented        Assessment & Plan:   Vaginal discharge Wet prep and GC chlamydia performed today.  Wet prep negative for yeast infection or bacterial vaginosis, but will prescribe metronidazole and Diflucan given patient's confidence that she has 1 or both of these infections and appearance of thick, curd-like discharge on vaginal exam.  STD exposure Although patient denies any new partners, she requests full STI testing today.   Will obtain GC chlamydia, RPR, and HIV.  She declines pregnancy test since she is only sexually active with a female partner.  Health care maintenance Patient requested a Pap smear, but this was performed 1 year ago and was negative for abnormality.  HPV testing was also one year ago, so she should not be due for repeat Pap and HPV testing for another 4 years.    Lezlie OctaveAmanda Hensley Aziz, M.D. 05/14/2018, 1:26 PM PGY-2, [facility]

## 2018-05-14 NOTE — Assessment & Plan Note (Signed)
Wet prep and GC chlamydia performed today.  Wet prep negative for yeast infection or bacterial vaginosis, but will prescribe metronidazole and Diflucan given patient's confidence that she has 1 or both of these infections and appearance of thick, curd-like discharge on vaginal exam.

## 2018-05-15 ENCOUNTER — Encounter: Payer: Self-pay | Admitting: Family Medicine

## 2018-05-15 LAB — HIV ANTIBODY (ROUTINE TESTING W REFLEX): HIV Screen 4th Generation wRfx: NONREACTIVE

## 2018-05-15 LAB — CERVICOVAGINAL ANCILLARY ONLY
Chlamydia: NEGATIVE
NEISSERIA GONORRHEA: NEGATIVE

## 2018-05-15 LAB — RPR: RPR: NONREACTIVE

## 2018-05-18 ENCOUNTER — Other Ambulatory Visit: Payer: Self-pay | Admitting: Family Medicine

## 2018-06-12 ENCOUNTER — Encounter: Payer: Self-pay | Admitting: Family Medicine

## 2018-06-12 ENCOUNTER — Ambulatory Visit (INDEPENDENT_AMBULATORY_CARE_PROVIDER_SITE_OTHER): Payer: Medicaid Other | Admitting: Family Medicine

## 2018-06-12 ENCOUNTER — Other Ambulatory Visit: Payer: Self-pay

## 2018-06-12 ENCOUNTER — Telehealth: Payer: Self-pay

## 2018-06-12 DIAGNOSIS — B9689 Other specified bacterial agents as the cause of diseases classified elsewhere: Secondary | ICD-10-CM | POA: Diagnosis not present

## 2018-06-12 DIAGNOSIS — Z23 Encounter for immunization: Secondary | ICD-10-CM | POA: Diagnosis not present

## 2018-06-12 DIAGNOSIS — N760 Acute vaginitis: Secondary | ICD-10-CM

## 2018-06-12 LAB — POCT WET PREP (WET MOUNT)
CLUE CELLS WET PREP WHIFF POC: NEGATIVE
TRICHOMONAS WET PREP HPF POC: ABSENT

## 2018-06-12 MED ORDER — FLUCONAZOLE 150 MG PO TABS: 150.0000 mg | ORAL_TABLET | ORAL | 1 refills | Status: DC

## 2018-06-12 MED ORDER — METRONIDAZOLE 500 MG PO TABS: 500.0000 mg | ORAL_TABLET | Freq: Two times a day (BID) | ORAL | 1 refills | Status: DC

## 2018-06-12 MED ORDER — METRONIDAZOLE 0.75 % VA GEL: 1.0000 | Freq: Two times a day (BID) | VAGINAL | 0 refills | Status: DC

## 2018-06-12 NOTE — Assessment & Plan Note (Signed)
Patient s/s consistent with BV. > 3x documented cases in past six months. Recommend 7 day course of metronidazole along with suppressive metro vag gel to prevent recurrence.

## 2018-06-12 NOTE — Progress Notes (Signed)
    Subjective:  Nancy Young is a 32 y.o. female who presents to the [facility] today with a chief complaint of vaginal discharge.   HPI:  Vaginal Discharge/Recurrent BV Patient has had vaginal discharge for the past 3 days.  Been nonbloody.  Denies any abdominal pain.  Endorses some dysuria.  It has been thick and white.  Patient has history of recurrent BV, 3X episodes in the past 6 months.  Each time treated with a seven-day course of metronidazole.  ROS: Per HPI  PMH: Smoking history reviewed.    Objective:  Physical Exam: BP 110/80   Pulse 93   Temp 98.3 F (36.8 C) (Oral)   Wt 217 lb (98.4 kg)   LMP 05/20/2018 (Exact Date)   SpO2 99%   BMI 36.11 kg/m   Gen: NAD, resting comfortably GU: Whitish discharge, no cervical motion tenderness, no vulvar lesions Neuro: grossly normal, moves all extremities   No results found for this or any previous visit (from the past 72 hour(s)).   Assessment/Plan:  Bacterial vaginosis Patient s/s consistent with BV. > 3x documented cases in past six months. Recommend 7 day course of metronidazole along with suppressive metro vag gel to prevent recurrence.     Lab Orders     POCT Wet Prep [facility])  Meds ordered this encounter  Medications  . fluconazole (DIFLUCAN) 150 MG tablet    Sig: Take 1 tablet (150 mg total) by mouth once a week.    Dispense:  2 tablet    Refill:  1  . metroNIDAZOLE (FLAGYL) 500 MG tablet    Sig: Take 1 tablet (500 mg total) by mouth 2 (two) times daily.    Dispense:  14 tablet    Refill:  1  . metroNIDAZOLE (METROGEL VAGINAL) 0.75 % vaginal gel    Sig: Place 1 Applicatorful vaginally 2 (two) times daily.    Dispense:  70 g    Refill:  0    Thomes Dinning, MD, MS FAMILY MEDICINE RESIDENT - PGY2 06/12/2018 2:04 PM

## 2018-06-12 NOTE — Patient Instructions (Signed)

## 2018-06-12 NOTE — Progress Notes (Signed)
Pls call pt and tell her that she is positive for Bv. She should take the Flagyl pills for 7 days. Then she can start using the vaginal gel. Patient should not drink alcohol on metronidazole.

## 2018-06-12 NOTE — Telephone Encounter (Signed)
-----   Message from Garnette Gunner, MD sent at 06/12/2018  2:27 PM EDT ----- Pls call pt and tell her that she is positive for Bv. She should take the Flagyl pills for 7 days. Then she can start using the vaginal gel. Patient should not drink alcohol on metronidazole.

## 2018-06-12 NOTE — Telephone Encounter (Signed)
Pt contacted and informed of positive BV. Pt informed of all medications to take and directions given.

## 2018-06-27 ENCOUNTER — Telehealth: Payer: Self-pay

## 2018-06-27 NOTE — Telephone Encounter (Signed)
Pt LVM on nurse line. I returned her call and left a generic message as there was no confirmation as to who's phone I was calling. Sunday SpillersSharon T Apollo Timothy, CMA

## 2018-06-28 ENCOUNTER — Emergency Department (HOSPITAL_COMMUNITY): Payer: Medicaid Other

## 2018-06-28 ENCOUNTER — Emergency Department (HOSPITAL_COMMUNITY)
Admission: EM | Admit: 2018-06-28 | Discharge: 2018-06-28 | Disposition: A | Discharge status: Home / Self Care | Payer: Medicaid Other | Attending: Emergency Medicine | Admitting: Emergency Medicine

## 2018-06-28 ENCOUNTER — Encounter (HOSPITAL_COMMUNITY): Payer: Self-pay

## 2018-06-28 DIAGNOSIS — R1032 Left lower quadrant pain: Secondary | ICD-10-CM | POA: Diagnosis not present

## 2018-06-28 DIAGNOSIS — Z79899 Other long term (current) drug therapy: Secondary | ICD-10-CM | POA: Diagnosis not present

## 2018-06-28 DIAGNOSIS — R102 Pelvic and perineal pain: Secondary | ICD-10-CM | POA: Diagnosis not present

## 2018-06-28 DIAGNOSIS — R1030 Lower abdominal pain, unspecified: Secondary | ICD-10-CM

## 2018-06-28 LAB — URINALYSIS, ROUTINE W REFLEX MICROSCOPIC
BILIRUBIN URINE: NEGATIVE
Glucose, UA: NEGATIVE mg/dL
Hgb urine dipstick: NEGATIVE
KETONES UR: NEGATIVE mg/dL
LEUKOCYTES UA: NEGATIVE
Nitrite: NEGATIVE
PH: 5 (ref 5.0–8.0)
PROTEIN: NEGATIVE mg/dL
Specific Gravity, Urine: 1.029 (ref 1.005–1.030)

## 2018-06-28 LAB — COMPREHENSIVE METABOLIC PANEL
ALK PHOS: 68 U/L (ref 38–126)
ALT: 27 U/L (ref 0–44)
AST: 31 U/L (ref 15–41)
Albumin: 3.8 g/dL (ref 3.5–5.0)
Anion gap: 10 (ref 5–15)
BILIRUBIN TOTAL: 0.9 mg/dL (ref 0.3–1.2)
BUN: 10 mg/dL (ref 6–20)
CALCIUM: 9.1 mg/dL (ref 8.9–10.3)
CO2: 25 mmol/L (ref 22–32)
CREATININE: 0.77 mg/dL (ref 0.44–1.00)
Chloride: 105 mmol/L (ref 98–111)
GFR calc Af Amer: >60 mL/min (ref >60)
GFR calc non Af Amer: >60 mL/min (ref >60)
GLUCOSE: 97 mg/dL (ref 70–99)
POTASSIUM: 3.8 mmol/L (ref 3.5–5.1)
Sodium: 140 mmol/L (ref 135–145)
TOTAL PROTEIN: 6.8 g/dL (ref 6.5–8.1)

## 2018-06-28 LAB — RAPID HIV SCREEN (HIV 1/2 AB+AG)
HIV 1/2 ANTIBODIES: NONREACTIVE
HIV-1 P24 Antigen - HIV24: NONREACTIVE

## 2018-06-28 LAB — LIPASE, BLOOD: Lipase: 26 U/L (ref 11–51)

## 2018-06-28 LAB — WET PREP, GENITAL
Sperm: NONE SEEN
Trich, Wet Prep: NONE SEEN
Yeast Wet Prep HPF POC: NONE SEEN

## 2018-06-28 LAB — I-STAT BETA HCG BLOOD, ED (MC, WL, AP ONLY): I-stat hCG, quantitative: <5 m[IU]/mL (ref <5)

## 2018-06-28 LAB — CBC
HEMATOCRIT: 39 % (ref 36.0–46.0)
Hemoglobin: 12.4 g/dL (ref 12.0–15.0)
MCH: 28.9 pg (ref 26.0–34.0)
MCHC: 31.8 g/dL (ref 30.0–36.0)
MCV: 90.9 fL (ref 78.0–100.0)
PLATELETS: 274 10*3/uL (ref 150–400)
RBC: 4.29 MIL/uL (ref 3.87–5.11)
RDW: 13.5 % (ref 11.5–15.5)
WBC: 6.7 10*3/uL (ref 4.0–10.5)

## 2018-06-28 LAB — RPR: RPR: NONREACTIVE

## 2018-06-28 MED ORDER — ONDANSETRON HCL 4 MG PO TABS: 4.0000 mg | ORAL_TABLET | Freq: Three times a day (TID) | ORAL | 0 refills | Status: DC | PRN

## 2018-06-28 MED ORDER — MORPHINE SULFATE (PF) 4 MG/ML IV SOLN
4.0000 mg | Freq: Once | INTRAVENOUS | Status: AC
  Administered 2018-06-28: 4 mg via INTRAVENOUS
  Filled 2018-06-28: qty 1

## 2018-06-28 MED ORDER — IBUPROFEN 600 MG PO TABS: 600.0000 mg | ORAL_TABLET | Freq: Four times a day (QID) | ORAL | 0 refills | Status: DC | PRN

## 2018-06-28 NOTE — Discharge Instructions (Signed)
Please return if your condition worsen or if you have any concerns.

## 2018-06-28 NOTE — ED Notes (Signed)
Pt discharged from ED; instructions provided and scripts given; Pt encouraged to return to ED if symptoms worsen and to f/u with PCP; Pt verbalized understanding of all instructions 

## 2018-06-28 NOTE — ED Provider Notes (Signed)
[facility] Provider Note   CSN: [id number] Arrival date & time: 06/28/18  [id number]     History   Chief Complaint Chief Complaint  Patient presents with  . Abdominal Pain    HPI Nancy Young is a 32 y.o. female.  The history is provided by the patient. No language interpreter was used.  Abdominal Pain       32 year old female presenting for evaluation of abdominal pain.  Patient endorsed progressive worsening crampy left lower quadrant abdominal pain ongoing for the past 5 days.  Pain is constant, rates at 8 out of 10 and sometimes worsening with movement.  Pain felt somewhat similar to prior pain that she had in the past related to her ovarian cyst.  She denies any associated fever, chills, chest pain, trouble breathing, productive cough, dysuria, hematuria, vaginal bleeding, vaginal discharge, nausea vomiting or diarrhea.  Her bowel movement has been normal.  She did try some Motrin at home without adequate relief.  Her last menstrual period was 06/19/2018.  She initially scheduled to follow-up with her PCP today however decided to come to the ER because the pain is not well controlled.  Past Medical History:  Diagnosis Date  . Obesity   . Vaginal discharge 07/27/2017    Patient Active Problem List   Diagnosis Date Noted  . Vaginal discharge 05/14/2018  . Bacterial vaginosis 07/27/2017  . Health care maintenance 07/02/2017  . Obesity 05/22/2017  . STD exposure 05/22/2017    Past Surgical History:  Procedure Laterality Date  . LEEP  2013     OB History   None      Home Medications    Prior to Admission medications   Medication Sig Start Date End Date Taking? Authorizing Provider  acetaminophen (TYLENOL) 500 MG tablet Take 1 tablet (500 mg total) by mouth every 6 (six) hours as needed. 02/16/18   Law, Waylan BogaAlexandra M, PA-C  fluconazole (DIFLUCAN) 150 MG tablet Take 1 tablet (150 mg total) by mouth once a week. 06/12/18    Garnette Gunnerhompson, Aaron B, MD  ibuprofen (ADVIL,MOTRIN) 600 MG tablet Take 1 tablet (600 mg total) by mouth every 6 (six) hours as needed. 02/16/18   Law, Waylan BogaAlexandra M, PA-C  metroNIDAZOLE (FLAGYL) 500 MG tablet Take 1 tablet (500 mg total) by mouth 2 (two) times daily. 06/12/18   Garnette Gunnerhompson, Aaron B, MD  metroNIDAZOLE (METROGEL VAGINAL) 0.75 % vaginal gel Place 1 Applicatorful vaginally 2 (two) times daily. 06/12/18   Garnette Gunnerhompson, Aaron B, MD  predniSONE (DELTASONE) 10 MG tablet Take 60mg  (6 tablets) on day one, then 50mg  the next day, then 40mg , then 30mg , then 20mg , then 10mg . 02/21/18   Palma HolterGunadasa, Kanishka G, MD    Family History Family History  Problem Relation Age of Onset  . Diabetes Mother   . Hypertension Mother   . Hyperlipidemia Mother   . Bipolar disorder Mother   . Diabetes Maternal Grandmother   . Sickle cell anemia Maternal Grandmother   . Heart attack Maternal Grandmother   . Anxiety disorder Maternal Grandmother   . Hypertension Maternal Grandmother   . Hyperlipidemia Maternal Grandmother   . Stroke Maternal Grandmother   . Hepatitis Maternal Grandfather   . Lung disease Maternal Grandfather   . Diabetes Paternal Grandmother   . Hypertension Paternal Grandmother   . Hyperlipidemia Paternal Grandmother   . Kidney disease Maternal Aunt   . Diabetes Maternal Aunt   . Hypertension Maternal Aunt   . Stomach cancer Other  Social History Social History   Tobacco Use  . Smoking status: Never Smoker  . Smokeless tobacco: Never Used  Substance Use Topics  . Alcohol use: Yes    Comment: 1-2 drinks per 2-3 times/week  . Drug use: No     Allergies   Patient has no known allergies.   Review of Systems Review of Systems  Gastrointestinal: Positive for abdominal pain.  All other systems reviewed and are negative.    Physical Exam Updated Vital Signs BP 138/77 (BP Location: Right Arm)   Pulse 78   Temp 98 F (36.7 C) (Oral)   Resp 16   LMP 06/19/2018   SpO2 100%    Physical Exam  Constitutional: She appears well-developed and well-nourished. No distress.  Obese female, well-appearing resting comfortably in bed  HENT:  Head: Atraumatic.  Eyes: Conjunctivae are normal.  Neck: Neck supple.  Cardiovascular: Normal rate and regular rhythm.  Pulmonary/Chest: Effort normal and breath sounds normal.  Abdominal: Soft. Normal appearance and bowel sounds are normal. There is tenderness in the left lower quadrant. There is no tenderness at McBurney's point and negative Murphy's sign.  Genitourinary:  Genitourinary Comments: Chaperone present during examination.  No inguinal lymphadenopathy or inguinal hernia noted on exam.  Normal external genitalia.  No significant discomfort with speculum insertion.  Small amount of vaginal discharge noted in vaginal vault without any blood.  Cervical os is closed.  On bimanual examination, left adnexal tenderness without cervical motion tenderness.  Neurological: She is alert.  Skin: No rash noted.  Psychiatric: She has a normal mood and affect.  Nursing note and vitals reviewed.    ED Treatments / Results  Labs (all labs ordered are listed, but only abnormal results are displayed) Labs Reviewed  WET PREP, GENITAL - Abnormal; Notable for the following components:      Result Value   Clue Cells Wet Prep HPF POC PRESENT (*)    WBC, Wet Prep HPF POC MANY (*)    All other components within normal limits  URINALYSIS, ROUTINE W REFLEX MICROSCOPIC - Abnormal; Notable for the following components:   APPearance CLOUDY (*)    All other components within normal limits  LIPASE, BLOOD  COMPREHENSIVE METABOLIC PANEL  CBC  RAPID HIV SCREEN (HIV 1/2 AB+AG)  RPR  I-STAT BETA HCG BLOOD, ED ([facility], [facility], [facility] ONLY)  GC/CHLAMYDIA PROBE AMP ([place]) NOT AT [facility]    EKG None  Radiology US Transvaginal Non-ob  Result Date: 06/28/2018 CLINICAL DATA:  Left adnexal pain. EXAM: TRANSABDOMINAL AND TRANSVAGINAL ULTRASOUND OF PELVIS  DOPPLER ULTRASOUND OF OVARIES TECHNIQUE: Both transabdominal and transvaginal ultrasound examinations of the pelvis were performed. Transabdominal technique was performed for global imaging of the pelvis including uterus, ovaries, adnexal regions, and pelvic cul-de-sac. It was necessary to proceed with endovaginal exam following the transabdominal exam to visualize the endometrium and ovaries. Color and duplex Doppler ultrasound was utilized to evaluate blood flow to the ovaries. COMPARISON:  None. FINDINGS: Uterus Measurements: 8.6 x 4.9 x 4.5 cm. No fibroids or other mass visualized. Endometrium Thickness: 5.5 mm which is within normal limits. No focal abnormality visualized. Right ovary Measurements: 2.8 x 2.2 x 1.9 cm. Normal appearance/no adnexal mass. Left ovary Measurements: 3.1 x 2.2 x 1.8 cm. Normal appearance/no adnexal mass. Pulsed Doppler evaluation of both ovaries demonstrates normal low-resistance arterial and venous waveforms. Other findings No abnormal free fluid. IMPRESSION: No abnormality seen in the pelvis. Electronically Signed   By: Lupita Raider, M.D.  On: 06/28/2018 10:10   US Pelvis Complete  Result Date: 06/28/2018 CLINICAL DATA:  Left adnexal pain. EXAM: TRANSABDOMINAL AND TRANSVAGINAL ULTRASOUND OF PELVIS DOPPLER ULTRASOUND OF OVARIES TECHNIQUE: Both transabdominal and transvaginal ultrasound examinations of the pelvis were performed. Transabdominal technique was performed for global imaging of the pelvis including uterus, ovaries, adnexal regions, and pelvic cul-de-sac. It was necessary to proceed with endovaginal exam following the transabdominal exam to visualize the endometrium and ovaries. Color and duplex Doppler ultrasound was utilized to evaluate blood flow to the ovaries. COMPARISON:  None. FINDINGS: Uterus Measurements: 8.6 x 4.9 x 4.5 cm. No fibroids or other mass visualized. Endometrium Thickness: 5.5 mm which is within normal limits. No focal abnormality visualized.  Right ovary Measurements: 2.8 x 2.2 x 1.9 cm. Normal appearance/no adnexal mass. Left ovary Measurements: 3.1 x 2.2 x 1.8 cm. Normal appearance/no adnexal mass. Pulsed Doppler evaluation of both ovaries demonstrates normal low-resistance arterial and venous waveforms. Other findings No abnormal free fluid. IMPRESSION: No abnormality seen in the pelvis. Electronically Signed   By: Lupita Raider, M.D.   On: 06/28/2018 10:10   [place] Art/ven Flow Abd Pelv Doppler  Result Date: 06/28/2018 CLINICAL DATA:  Left adnexal pain. EXAM: TRANSABDOMINAL AND TRANSVAGINAL ULTRASOUND OF PELVIS DOPPLER ULTRASOUND OF OVARIES TECHNIQUE: Both transabdominal and transvaginal ultrasound examinations of the pelvis were performed. Transabdominal technique was performed for global imaging of the pelvis including uterus, ovaries, adnexal regions, and pelvic cul-de-sac. It was necessary to proceed with endovaginal exam following the transabdominal exam to visualize the endometrium and ovaries. Color and duplex Doppler ultrasound was utilized to evaluate blood flow to the ovaries. COMPARISON:  None. FINDINGS: Uterus Measurements: 8.6 x 4.9 x 4.5 cm. No fibroids or other mass visualized. Endometrium Thickness: 5.5 mm which is within normal limits. No focal abnormality visualized. Right ovary Measurements: 2.8 x 2.2 x 1.9 cm. Normal appearance/no adnexal mass. Left ovary Measurements: 3.1 x 2.2 x 1.8 cm. Normal appearance/no adnexal mass. Pulsed Doppler evaluation of both ovaries demonstrates normal low-resistance arterial and venous waveforms. Other findings No abnormal free fluid. IMPRESSION: No abnormality seen in the pelvis. Electronically Signed   By: Lupita Raider, M.D.   On: 06/28/2018 10:10    Procedures Pelvic exam Date/Time: 06/28/2018 7:06 AM Performed by: Fayrene Helper, PA-C Authorized by: Fayrene Helper, PA-C  Consent: Verbal consent obtained. Consent given by: patient Patient understanding: patient states understanding of  the procedure being performed Local anesthesia used: no  Anesthesia: Local anesthesia used: no  Sedation: Patient sedated: no  Patient tolerance: Patient tolerated the procedure well with no immediate complications    (including critical care time)  Medications Ordered in ED Medications  morphine 4 MG/ML injection 4 mg (4 mg Intravenous Given 06/28/18 0718)     Initial Impression / Assessment and Plan / ED Course  I have reviewed the triage vital signs and the nursing notes.  Pertinent labs & imaging results that were available during my care of the patient were reviewed by me and considered in my medical decision making (see chart for details).     BP 138/77 (BP Location: Right Arm)   Pulse 78   Temp 98 F (36.7 C) (Oral)   Resp 16   LMP 06/19/2018   SpO2 100%    Final Clinical Impressions(s) / ED Diagnoses   Final diagnoses:  Lower abdominal pain    ED Discharge Orders         Ordered    ibuprofen (ADVIL,MOTRIN) 600 MG  tablet  Every 6 hours PRN     06/28/18 1100    ondansetron (ZOFRAN) 4 MG tablet  Every 8 hours PRN     06/28/18 1100         6:46 AM Patient here with persistent pain to her left lower abdomen.  She does have some tenderness to palpation of the left lower quadrant without guarding or rebound tenderness.  She will benefit from a pelvic examination as her pain is somewhat similar to prior ovarian cyst.  Patient otherwise well-appearing.  7:07 AM On pelvic examination, patient does have left adnexal tenderness.  I offer prophylaxis antibiotic but patient declined at this time and prefers to wait for the results instead. Pelvic US ordered.  10:50 AM Pelvic ultrasound and transvaginal ultrasound without acute abnormality.  Her labs are reassuring.  Patient discharged home with symptomatic treatment.  Suspect round ligament syndrome.  Low suspicion for diverticulitis, appendicitis, or other acute abdominal pathology.  Patient however understand  that she can return promptly if her condition worsen at that time she may benefit from an abdominal pelvic CT scan if indicated.   Fayrene Helper, PA-C 06/28/18 1101    Palumbo, April, MD 06/28/18 2322

## 2018-06-28 NOTE — ED Notes (Addendum)
Patient transported to US 

## 2018-06-28 NOTE — ED Triage Notes (Signed)
Pt states that she has been having a constant LLQ pain since Monday, denies n/v/d/fevers. Denies urinary symptoms or discharge

## 2018-07-01 LAB — GC/CHLAMYDIA PROBE AMP (~~LOC~~) NOT AT ARMC
CHLAMYDIA, DNA PROBE: NEGATIVE
NEISSERIA GONORRHEA: NEGATIVE

## 2018-08-12 ENCOUNTER — Ambulatory Visit (HOSPITAL_COMMUNITY)
Admission: EM | Admit: 2018-08-12 | Discharge: 2018-08-12 | Disposition: A | Discharge status: Home / Self Care | Payer: Medicaid Other | Attending: Family Medicine | Admitting: Family Medicine

## 2018-08-12 ENCOUNTER — Encounter (HOSPITAL_COMMUNITY): Payer: Self-pay | Admitting: Emergency Medicine

## 2018-08-12 DIAGNOSIS — N760 Acute vaginitis: Secondary | ICD-10-CM | POA: Diagnosis not present

## 2018-08-12 MED ORDER — METRONIDAZOLE 0.75 % VA GEL: 1.0000 | Freq: Every day | VAGINAL | 0 refills | Status: AC

## 2018-08-12 MED ORDER — FLUCONAZOLE 150 MG PO TABS: 150.0000 mg | ORAL_TABLET | Freq: Every day | ORAL | 0 refills | Status: DC

## 2018-08-12 NOTE — ED Triage Notes (Signed)
Pt statse "I have a yeast infection" c/o white clumpy discharged x2 days.

## 2018-08-12 NOTE — ED Provider Notes (Signed)
[facility]    CSN: [id number] Arrival date & time: 08/12/18  1136     History   Chief Complaint Chief Complaint  Patient presents with  . Vaginal Discharge    HPI Nancy Young is a 32 y.o. female.   32 year old female comes in for few day history of vaginal irritation, vaginal discharge.  Describes the discharge is clumpy/thick.  Denies any odor, vaginal bleeding.  Denies fever, chills, night sweats.  Denies abdominal pain, nausea, vomiting.  Denies urinary symptoms such as frequency, dysuria, hematuria.  Patient states gets BV and yeast very frequently, and feels the same as both.  Has been trying to take more probiotics without decreasing frequency of BV.  She is sexually active with one female partner. LMP 07/29/2018.     Past Medical History:  Diagnosis Date  . Obesity   . Vaginal discharge 07/27/2017    Patient Active Problem List   Diagnosis Date Noted  . Vaginal discharge 05/14/2018  . Bacterial vaginosis 07/27/2017  . Health care maintenance 07/02/2017  . Obesity 05/22/2017  . STD exposure 05/22/2017    Past Surgical History:  Procedure Laterality Date  . LEEP  2013    OB History   None      Home Medications    Prior to Admission medications   Medication Sig Start Date End Date Taking? Authorizing Provider  fluconazole (DIFLUCAN) 150 MG tablet Take 1 tablet (150 mg total) by mouth daily. Take second dose 72 hours later if symptoms still persists. 08/12/18   Cathie Hoops,  V, PA-C  ibuprofen (ADVIL,MOTRIN) 600 MG tablet Take 1 tablet (600 mg total) by mouth every 6 (six) hours as needed for moderate pain. Patient not taking: Reported on 08/12/2018 06/28/18   Fayrene Helper, PA-C  metroNIDAZOLE (METROGEL VAGINAL) 0.75 % vaginal gel Place 1 Applicatorful vaginally at bedtime for 5 days. 08/12/18 08/17/18  Cathie Hoops,  V, PA-C  ondansetron (ZOFRAN) 4 MG tablet Take 1 tablet (4 mg total) by mouth every 8 (eight) hours as needed for nausea or  vomiting. Patient not taking: Reported on 08/12/2018 06/28/18   Fayrene Helper, PA-C    Family History Family History  Problem Relation Age of Onset  . Diabetes Mother   . Hypertension Mother   . Hyperlipidemia Mother   . Bipolar disorder Mother   . Diabetes Maternal Grandmother   . Sickle cell anemia Maternal Grandmother   . Heart attack Maternal Grandmother   . Anxiety disorder Maternal Grandmother   . Hypertension Maternal Grandmother   . Hyperlipidemia Maternal Grandmother   . Stroke Maternal Grandmother   . Hepatitis Maternal Grandfather   . Lung disease Maternal Grandfather   . Diabetes Paternal Grandmother   . Hypertension Paternal Grandmother   . Hyperlipidemia Paternal Grandmother   . Kidney disease Maternal Aunt   . Diabetes Maternal Aunt   . Hypertension Maternal Aunt   . Stomach cancer Other     Social History Social History   Tobacco Use  . Smoking status: Never Smoker  . Smokeless tobacco: Never Used  Substance Use Topics  . Alcohol use: Yes    Comment: 1-2 drinks per 2-3 times/week  . Drug use: No     Allergies   Patient has no known allergies.   Review of Systems Review of Systems  Reason unable to perform ROS: See HPI as above.     Physical Exam Triage Vital Signs ED Triage Vitals  Enc Vitals Group     BP 08/12/18 1248 129/77  Pulse Rate 08/12/18 1247 66     Resp 08/12/18 1247 16     Temp 08/12/18 1247 97.8 F (36.6 C)     Temp src --      SpO2 08/12/18 1247 100 %     Weight --      Height --      Head Circumference --      Peak Flow --      Pain Score 08/12/18 1247 0     Pain Loc --      Pain Edu? --      Excl. in GC? --    No data found.  Updated Vital Signs BP 129/77   Pulse 66   Temp 97.8 F (36.6 C)   Resp 16   LMP 07/29/2018   SpO2 100%   Physical Exam  Constitutional: She is oriented to person, place, and time. She appears well-developed and well-nourished. No distress.  HENT:  Head: Normocephalic and  atraumatic.  Eyes: Pupils are equal, round, and reactive to light. Conjunctivae are normal.  Cardiovascular: Normal rate, regular rhythm and normal heart sounds. Exam reveals no gallop and no friction rub.  No murmur heard. Pulmonary/Chest: Effort normal and breath sounds normal. She has no wheezes. She has no rales.  Abdominal: Soft. Bowel sounds are normal. She exhibits no mass. There is no tenderness. There is no rebound, no guarding and no CVA tenderness.  Neurological: She is alert and oriented to person, place, and time.  Skin: Skin is warm and dry.  Psychiatric: She has a normal mood and affect. Her behavior is normal. Judgment normal.   UC Treatments / Results  Labs (all labs ordered are listed, but only abnormal results are displayed) Labs Reviewed - No data to display  EKG None  Radiology No results found.  Procedures Procedures (including critical care time)  Medications Ordered in UC Medications - No data to display  Initial Impression / Assessment and Plan / UC Course  I have reviewed the triage vital signs and the nursing notes.  Pertinent labs & imaging results that were available during my care of the patient were reviewed by me and considered in my medical decision making (see chart for details).    Diflucan and metrogel as directed. Patient to refrain from sexual activity for the next 7 days. Return precautions given.   Final Clinical Impressions(s) / UC Diagnoses   Final diagnoses:  Acute vaginitis    ED Prescriptions    Medication Sig Dispense Auth. Provider   fluconazole (DIFLUCAN) 150 MG tablet Take 1 tablet (150 mg total) by mouth daily. Take second dose 72 hours later if symptoms still persists. 2 tablet ,  V, PA-C   metroNIDAZOLE (METROGEL VAGINAL) 0.75 % vaginal gel Place 1 Applicatorful vaginally at bedtime for 5 days. 50 g Threasa Alpha, [place] 08/12/18 1313

## 2018-08-12 NOTE — Discharge Instructions (Signed)
Start diflucan and metrogel as directed. Keep hydrated, your urine should be clear to pale yellow in color. Avoid soap for now. Monitor for abdominal pain, nausea/vomiting, urinary symptoms, fever, follow up for reevaluation needed.

## 2018-08-24 ENCOUNTER — Encounter (HOSPITAL_COMMUNITY): Payer: Self-pay | Admitting: *Deleted

## 2018-08-24 ENCOUNTER — Emergency Department (HOSPITAL_COMMUNITY)
Admission: EM | Admit: 2018-08-24 | Discharge: 2018-08-24 | Disposition: A | Discharge status: Home / Self Care | Payer: Medicaid Other | Attending: Emergency Medicine | Admitting: Emergency Medicine

## 2018-08-24 ENCOUNTER — Other Ambulatory Visit: Payer: Self-pay

## 2018-08-24 DIAGNOSIS — M25462 Effusion, left knee: Secondary | ICD-10-CM | POA: Diagnosis not present

## 2018-08-24 DIAGNOSIS — M25562 Pain in left knee: Secondary | ICD-10-CM | POA: Diagnosis present

## 2018-08-24 MED ORDER — MELOXICAM 7.5 MG PO TABS: 7.5000 mg | ORAL_TABLET | Freq: Every day | ORAL | 0 refills | Status: AC

## 2018-08-24 NOTE — ED Provider Notes (Signed)
[facility] EMERGENCY DEPARTMENT Provider Note   CSN: [id number] Arrival date & time: 08/24/18  1510     History   Chief Complaint Chief Complaint  Patient presents with  . Knee Pain    LT    HPI Nancy Young is a 32 y.o. female.  32 year old female presents with complaint of left knee pain with swelling 2 weeks.  Patient states that she was seen in this emergency room 2 months ago and told that she had arthritis in her knee.  Patient has been applying ice, taking Motrin, wearing an elastic knee sleeve without relief of her pain.  Pain is located left and right joint lines and anterior left knee, worse with movement and bearing weight, worse climbing stairs.  She denies recent falls or injuries.  No other complaints or concerns.     Past Medical History:  Diagnosis Date  . Obesity   . Vaginal discharge 07/27/2017    Patient Active Problem List   Diagnosis Date Noted  . Vaginal discharge 05/14/2018  . Bacterial vaginosis 07/27/2017  . Health care maintenance 07/02/2017  . Obesity 05/22/2017  . STD exposure 05/22/2017    Past Surgical History:  Procedure Laterality Date  . LEEP  2013     OB History   None      Home Medications    Prior to Admission medications   Medication Sig Start Date End Date Taking? Authorizing Provider  fluconazole (DIFLUCAN) 150 MG tablet Take 1 tablet (150 mg total) by mouth daily. Take second dose 72 hours later if symptoms still persists. 08/12/18   Cathie Hoops, Amy V, PA-C  meloxicam (MOBIC) 7.5 MG tablet Take 1 tablet (7.5 mg total) by mouth daily for 10 days. 08/24/18 09/03/18  Jeannie Fend, PA-C    Family History Family History  Problem Relation Age of Onset  . Diabetes Mother   . Hypertension Mother   . Hyperlipidemia Mother   . Bipolar disorder Mother   . Diabetes Maternal Grandmother   . Sickle cell anemia Maternal Grandmother   . Heart attack Maternal Grandmother   . Anxiety disorder Maternal  Grandmother   . Hypertension Maternal Grandmother   . Hyperlipidemia Maternal Grandmother   . Stroke Maternal Grandmother   . Hepatitis Maternal Grandfather   . Lung disease Maternal Grandfather   . Diabetes Paternal Grandmother   . Hypertension Paternal Grandmother   . Hyperlipidemia Paternal Grandmother   . Kidney disease Maternal Aunt   . Diabetes Maternal Aunt   . Hypertension Maternal Aunt   . Stomach cancer Other     Social History Social History   Tobacco Use  . Smoking status: Never Smoker  . Smokeless tobacco: Never Used  Substance Use Topics  . Alcohol use: Yes    Comment: 1-2 drinks per 2-3 times/week  . Drug use: No     Allergies   Patient has no known allergies.   Review of Systems Review of Systems  Constitutional: Negative for fever.  Musculoskeletal: Positive for arthralgias, gait problem, joint swelling and myalgias.  Skin: Negative for color change, rash and wound.  Allergic/Immunologic: Negative for immunocompromised state.  Neurological: Negative for weakness and numbness.  Hematological: Negative for adenopathy. Does not bruise/bleed easily.  All other systems reviewed and are negative.    Physical Exam Updated Vital Signs BP 129/80 (BP Location: Right Arm)   Pulse 85   Temp 97.6 F (36.4 C) (Oral)   Resp 18   LMP 07/29/2018   SpO2  100%   Physical Exam  Constitutional: She is oriented to person, place, and time. She appears well-developed and well-nourished. No distress.  HENT:  Head: Normocephalic and atraumatic.  Cardiovascular: Intact distal pulses.  Pulmonary/Chest: Effort normal.  Musculoskeletal: She exhibits tenderness. She exhibits no deformity.       Left knee: She exhibits decreased range of motion and effusion. She exhibits no ecchymosis, no deformity, no laceration, no erythema, no LCL laxity, normal patellar mobility and no MCL laxity. Tenderness found. Medial joint line and lateral joint line tenderness noted.        Legs: Neurological: She is alert and oriented to person, place, and time. No sensory deficit.  Skin: Skin is warm and dry. No rash noted. She is not diaphoretic. No erythema.  Psychiatric: She has a normal mood and affect. Her behavior is normal.  Nursing note and vitals reviewed.    ED Treatments / Results  Labs (all labs ordered are listed, but only abnormal results are displayed) Labs Reviewed - No data to display  EKG None  Radiology No results found.  Procedures Procedures (including critical care time)  Medications Ordered in ED Medications - No data to display   Initial Impression / Assessment and Plan / ED Course  I have reviewed the triage vital signs and the nursing notes.  Pertinent labs & imaging results that were available during my care of the patient were reviewed by me and considered in my medical decision making (see chart for details).  Clinical Course as of Aug 24 1530  Sat Aug 24, 2018  1530 31yo female with left knee pain without injury. On exam, slight effusion, no erythema. Patient given crutches, rx for meloxicam, plans to see her PCP on Monday.   [LM]    Clinical Course User Index [LM] Jeannie FendMurphy, Alecxis Baltzell A, PA-C   Final Clinical Impressions(s) / ED Diagnoses   Final diagnoses:  Effusion of left knee    ED Discharge Orders         Ordered    meloxicam (MOBIC) 7.5 MG tablet  Daily     08/24/18 1523           Jeannie FendMurphy, Tommye Lehenbauer A, PA-C 08/24/18 1531    Tilden Fossaees, Elizabeth, MD 08/24/18 1536

## 2018-08-24 NOTE — Discharge Instructions (Addendum)
Take Meloxicam as prescribed as needed for pain. DO NOT take Motrin while taking Meloxicam. Alternate warm/cold compresses for 20 minutes at a time. Follow up on Monday with your doctor as planned.

## 2018-08-24 NOTE — ED Triage Notes (Signed)
PT reports Lt knee pain is worse. No injury to knee. Pt has used OTC  With out relief of pain.

## 2018-08-26 ENCOUNTER — Other Ambulatory Visit: Payer: Self-pay

## 2018-08-26 ENCOUNTER — Ambulatory Visit (INDEPENDENT_AMBULATORY_CARE_PROVIDER_SITE_OTHER): Payer: Medicaid Other | Admitting: Family Medicine

## 2018-08-26 ENCOUNTER — Ambulatory Visit
Admission: RE | Admit: 2018-08-26 | Discharge: 2018-08-26 | Disposition: A | Discharge status: Home / Self Care | Payer: Medicaid Other | Source: Ambulatory Visit | Attending: Family Medicine | Admitting: Family Medicine

## 2018-08-26 VITALS — BP 122/80 | HR 85 | Temp 98.5°F | Wt 205.4 lb

## 2018-08-26 DIAGNOSIS — G8929 Other chronic pain: Secondary | ICD-10-CM | POA: Diagnosis not present

## 2018-08-26 DIAGNOSIS — M7989 Other specified soft tissue disorders: Secondary | ICD-10-CM | POA: Diagnosis not present

## 2018-08-26 DIAGNOSIS — M25562 Pain in left knee: Secondary | ICD-10-CM | POA: Diagnosis not present

## 2018-08-26 MED ORDER — NAPROXEN 500 MG PO TABS: 500.0000 mg | ORAL_TABLET | Freq: Two times a day (BID) | ORAL | 0 refills | Status: DC

## 2018-08-26 NOTE — Progress Notes (Signed)
  Patient Name: Nancy Young -McLean Date of Birth: 01-16-1986 Date of Visit: 08/26/18 PCP: Leland HerYoo, Elsia J, DO  Chief Complaint: left knee pain going up and down stairs   Subjective: Nancy Young -McLean is a pleasant 32 y.o. year old woman with history significant for obesity and chronic left knee pain presenting with worsening of her left knee pain.  The patient reports that 455-month or more history of difficulties with her left knee.  She reports the knee pain acutely worsened over the last week.  She had no trauma associated with this.  She has not done any additional activity.  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  Most of her job is sitting.  She reports knee pain when she goes up and down stairs.  She also reports the sound of cracking and popping.  She denies falls, numbness, weakness, redness, fevers, tick exposure.  She does endorse some knee swelling at times.  There is no family history of inflammatory arthropathy.  She denies bowel or bladder symptoms.  Denies fevers, weight loss, atypical rashes.  ROS:  ROS Negative for trauma, prior injury, tick bite and remainder of symptoms as above. I have reviewed the patient's medical, surgical, family, and social history as appropriate.   Vitals:   08/26/18 1407  BP: 122/80  Pulse: 85  Temp: 98.5 F (36.9 C)  SpO2: 99%   Filed Weights   08/26/18 1407  Weight: 205 lb 6.4 oz (93.2 kg)  Cardiac: Warm well perfused.  Capillary refill less than 3 seconds Respiratory breathing comfortably on room air Psych: Pleasant normal affect, appropriate, normal rate of speech MSK Bilateral legs examined.  Gait is slightly antalgic favoring the right.  Upon inspection the left knee is slightly swollen as compared to the right..  There is no erythema or warmth.  There is a small left knee effusion.  Tenderness to palpation along medial and lateral joint line as well as along anterior patellar tendon.  Negative Lockman's and McMurray.  The calves are  symmetric.  No posterior palpable cord.  Negative Thessaly test   Nancy Young was seen today for left knee pain.  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  Differential also includes meniscal pathology or osteoarthritis.  We discussed therapy options at length.  Recommended physical therapy for vastus medialis strengthening.  Could consider knee injection in the future.  The patient is young and would be at risk for cartilage damage and loss going forward.  Given mixed evidence I did not offer this today.  No signs of Lyme arthritis, reactive arthritis, inflammatory arthropathy or chondrocalcinosis. -     DG Knee Complete 4 Views Left; Future -     naproxen (NAPROSYN) 500 MG tablet; Take 1 tablet (500 mg total) by mouth 2 (two) times daily with a meal. -     Ambulatory referral to Physical Therapy    Terisa Starrarina , MD  Family Medicine Teaching Service

## 2018-08-26 NOTE — Patient Instructions (Signed)
Tylenol (not the PM type) three times per day   Naproxen twice a day for a for pain  Please go obtain your x-ray  I will call you with results  It was wonderful to see you today.  Thank you for choosing [facility].   Please call [phone] with any questions about today's appointment.  Please be sure to schedule follow up at the front  desk before you leave today.   Terisa Starrarina Kennidi Yoshida, MD  Family Medicine

## 2018-08-27 ENCOUNTER — Encounter: Payer: Self-pay | Admitting: Family Medicine

## 2018-08-27 ENCOUNTER — Telehealth: Payer: Self-pay

## 2018-08-27 NOTE — Telephone Encounter (Signed)
Patient calling for Xray results.  Call back is [phone]  Ples SpecterAlisa Ketrick Matney, RN [facility] RN)

## 2018-08-27 NOTE — Telephone Encounter (Signed)
Attempted to call patient. Mailbox full. X-ray is normal--shows no fluid or abnormality. Recommend trial of anti inflammatories as prescribed and PT. I am happy to discuss more with patient. Please let her know above results if she calls back.  Nancy Starrarina Alesia Oshields, MD  Family Medicine Teaching Service

## 2018-08-28 NOTE — Telephone Encounter (Signed)
Pt informed.  No additional questions. Nancy Young, Nancy RochesterJessica Dawn, CMA

## 2018-10-16 ENCOUNTER — Ambulatory Visit (INDEPENDENT_AMBULATORY_CARE_PROVIDER_SITE_OTHER): Payer: Medicaid Other | Admitting: Family Medicine

## 2018-10-16 ENCOUNTER — Other Ambulatory Visit (HOSPITAL_COMMUNITY)
Admission: RE | Admit: 2018-10-16 | Discharge: 2018-10-16 | Disposition: A | Discharge status: Home / Self Care | Payer: Medicaid Other | Source: Ambulatory Visit | Attending: Family Medicine | Admitting: Family Medicine

## 2018-10-16 VITALS — BP 114/64 | HR 76 | Temp 98.2°F | Wt 209.4 lb

## 2018-10-16 DIAGNOSIS — Z0000 Encounter for general adult medical examination without abnormal findings: Secondary | ICD-10-CM | POA: Diagnosis not present

## 2018-10-16 DIAGNOSIS — N898 Other specified noninflammatory disorders of vagina: Secondary | ICD-10-CM

## 2018-10-16 DIAGNOSIS — Z202 Contact with and (suspected) exposure to infections with a predominantly sexual mode of transmission: Secondary | ICD-10-CM

## 2018-10-16 LAB — POCT WET PREP (WET MOUNT)
CLUE CELLS WET PREP WHIFF POC: NEGATIVE
TRICHOMONAS WET PREP HPF POC: ABSENT

## 2018-10-16 NOTE — Assessment & Plan Note (Signed)
Wet prep normal. Patient informed. GC/CT collected today.

## 2018-10-16 NOTE — Progress Notes (Signed)
    Subjective:  Nancy Young is a 33 y.o. female who presents to the [facility] today with a chief complaint of vaginal discharge.   HPI:  Has had vaginal discharge for the past 2 days that is thick and whitish but not really otchy/ No dysuria or bleeding. No known STD exposures. Is sexually active with her girlfriend. Otherwise has been in good health. Agreeable to pap smear today  ROS: Per HPI   Objective:  Physical Exam: BP 114/64   Pulse 76   Temp 98.2 F (36.8 C) (Oral)   Wt 209 lb 6 oz (95 kg)   LMP 09/23/2018   SpO2 99%   BMI 34.84 kg/m   Gen: NAD, resting comfortably Pulm: NWOB Pelvic exam: normal external genitalia, vulva, vagina. Cervix with small 1-2 mm polyp at 11'oclock otherwise normal Skin: warm, dry Neuro: grossly normal, moves all extremities Psych: Normal affect and thought content  Results for orders placed or performed in visit on 10/16/18 (from the past 72 hour(s))  POCT Wet Prep Mellody Drown [place])     Status: None   Collection Time: 10/16/18  2:00 PM  Result Value Ref Range   Source Wet Prep POC VAG    WBC, Wet Prep HPF POC 0-3    Bacteria Wet Prep HPF POC Few Few   Clue Cells Wet Prep HPF POC None None   Clue Cells Wet Prep Whiff POC Negative Whiff    Yeast Wet Prep HPF POC None None   Trichomonas Wet Prep HPF POC Absent Absent     Assessment/Plan:  Health care maintenance Pap smear obtained today  Vaginal discharge Wet prep normal. Patient informed. GC/CT collected today.  STD exposure GC/CT, HIV, RPR collected today.   Leland Her, DO PGY-3, [facility] 10/16/2018 2:03 PM

## 2018-10-16 NOTE — Patient Instructions (Signed)
It was good to see you today!   We are checking some labs today. If results require attention, either myself or my nurse will get in touch with you. If everything is normal, you will get a letter in the mail or a message in My Chart. Please give us a call if you do not hear from us after 2 weeks.  Feel free to call with any questions or concerns at any time, at [phone].   Take care,  Dr. Rhaya Coale J Raeann Offner, DO [facility]  

## 2018-10-16 NOTE — Assessment & Plan Note (Signed)
GC/CT, HIV, RPR collected today.

## 2018-10-16 NOTE — Assessment & Plan Note (Signed)
Pap smear obtained today. 

## 2018-10-17 LAB — CYTOLOGY - PAP: Diagnosis: NEGATIVE

## 2018-10-17 LAB — CERVICOVAGINAL ANCILLARY ONLY
Chlamydia: NEGATIVE
NEISSERIA GONORRHEA: NEGATIVE

## 2018-10-17 LAB — RPR: RPR Ser Ql: NONREACTIVE

## 2018-10-17 LAB — HIV ANTIBODY (ROUTINE TESTING W REFLEX): HIV Screen 4th Generation wRfx: NONREACTIVE

## 2018-10-18 ENCOUNTER — Encounter: Payer: Self-pay | Admitting: Family Medicine

## 2018-10-21 ENCOUNTER — Ambulatory Visit (INDEPENDENT_AMBULATORY_CARE_PROVIDER_SITE_OTHER): Payer: Medicaid Other | Admitting: Family Medicine

## 2018-10-21 ENCOUNTER — Encounter: Payer: Self-pay | Admitting: Family Medicine

## 2018-10-21 VITALS — BP 116/78 | HR 81 | Temp 99.3°F | Wt 211.0 lb

## 2018-10-21 DIAGNOSIS — A084 Viral intestinal infection, unspecified: Secondary | ICD-10-CM

## 2018-10-21 NOTE — Patient Instructions (Signed)
Good to see you today!  Thanks for coming in.  You can go back to work when you stop having diarrhea.  You can take Pepto bismol for the loose stools - this can make your bowel movement and tongue black  Drink small sips of gatorade or half strength juice to keep hydrated,  If you think you need Zofran then call us

## 2018-10-21 NOTE — Progress Notes (Signed)
Subjective  Nancy Young is a 33 y.o. female is presenting with the following  VOMITING  Vomiting began 1 days ago. Progression: started suddenly at 2 AM last at 7 AM Medications tried: none Recent travel: no Recent sick contacts: yes at work at [employer] Ingested suspicious foods: no Immunocompromised: no  Symptoms Diarrhea: yes almost hourly Abdominal pain: no Blood in vomit: no Weight loss: no Decreased urine output:no Lightheadedness: no Fever: no Bloody stools: no  ROS see HPI Smoking Status noted  Chief Complaint noted Review of Symptoms - see HPI PMH - Smoking status noted.    Objective Vital Signs reviewed LMP 09/23/2018  Alert healthy Abdomen: soft and non-tender without masses, organomegaly or hernias noted.  No guarding or rebound Skin:  Intact without suspicious lesions or rashes Mouth - no lesions, mucous membranes are moist, no decaying teeth    Assessments/Plans Viral Gastroenteritis with no worrying symptoms or signs- see after visit summary.

## 2018-11-11 ENCOUNTER — Ambulatory Visit (INDEPENDENT_AMBULATORY_CARE_PROVIDER_SITE_OTHER): Payer: Medicaid Other | Admitting: Family Medicine

## 2018-11-11 ENCOUNTER — Other Ambulatory Visit: Payer: Self-pay

## 2018-11-11 ENCOUNTER — Encounter: Payer: Self-pay | Admitting: Family Medicine

## 2018-11-11 VITALS — BP 122/80 | HR 84 | Temp 98.5°F | Ht 65.0 in | Wt 212.0 lb

## 2018-11-11 DIAGNOSIS — B9689 Other specified bacterial agents as the cause of diseases classified elsewhere: Secondary | ICD-10-CM | POA: Diagnosis not present

## 2018-11-11 DIAGNOSIS — N760 Acute vaginitis: Secondary | ICD-10-CM | POA: Diagnosis not present

## 2018-11-11 DIAGNOSIS — N898 Other specified noninflammatory disorders of vagina: Secondary | ICD-10-CM | POA: Diagnosis not present

## 2018-11-11 LAB — POCT WET PREP (WET MOUNT)
Clue Cells Wet Prep Whiff POC: NEGATIVE
Trichomonas Wet Prep HPF POC: ABSENT

## 2018-11-11 MED ORDER — METRONIDAZOLE 0.75 % VA GEL: 1.0000 | Freq: Two times a day (BID) | VAGINAL | 0 refills | Status: DC

## 2018-11-11 NOTE — Patient Instructions (Signed)
It was so nice meeting you today!  Your wet prep was negative, however I have sent some metronidazole gel to the pharmacy for your recurrent issue of bacterial vaginosis.  I encourage you to stay well-hydrated, use probiotics/yogurt to help with your flora.

## 2018-11-11 NOTE — Assessment & Plan Note (Signed)
Wet prep negative.  Discussed routine care, recommended for patient to stay well-hydrated, to add probiotics/yogurt into her daily eating schedule, and regular bathing.  Avoid douching.  May use soaps in external area, recommended Dove.  Given history of recurrent bacterial vaginosis (over 3 documented episodes in the past year), prescribed MetroGel.  Stated she does not need to use this gel at this time, however may use if she experiences change in vaginal discharge color/odor/frequency with vaginal irritation similar to previously documented episodes.

## 2018-11-11 NOTE — Progress Notes (Signed)
   Subjective:    Patient ID: Nancy Young, female    DOB: 02-16-1986, 33 y.o.   MRN: [id number]   CC: Bacterial infection  HPI: Nancy Young is a 33 year old female presenting to discuss the following:  Vaginal discharge: States she has bacterial vaginosis every 2-3 months, curious to see if there is any therapy that she can do at home, instead of having to to be evaluated every time she has symptoms.  Does not have many days off, busy schedule.  Currently, she complains of increased vaginal discharge, milky.  Also endorses some vaginal irritation, denies itchiness.  No change in odor.  Denies any change in soaps, new toys.  Sexually active in a monogamous relationship, female partner.  Denies any associated fever, dysuria, urinary frequency, rash, abdominal pain.  No concern for STDs, recently evaluated and all negative in January.    Smoking status reviewed  Review of Systems Per HPI, also denies recent illness, fever, headache, changes in vision, chest pain, shortness of breath, abdominal pain, N/V/D, weakness   Patient Active Problem List   Diagnosis Date Noted  . Vaginal discharge 05/14/2018  . Bacterial vaginosis 07/27/2017  . Health care maintenance 07/02/2017  . Obesity 05/22/2017  . STD exposure 05/22/2017     Objective:  BP 122/80   Pulse 84   Temp 98.5 F (36.9 C) (Oral)   Ht 5\' 5"  (1.651 m)   Wt 212 lb (96.2 kg)   SpO2 98%   BMI 35.28 kg/m  Vitals and nursing note reviewed  General: NAD, pleasant Cardiac: RRR, normal heart sounds, no murmurs Respiratory: CTAB, normal effort Abdomen: soft, nontender, nondistended Extremities: no edema or cyanosis. WWP. Skin: warm and dry, no rashes noted Neuro: alert and oriented, no focal deficits Psych: normal affect GU: Normal appearance of external genitalia, vagina, and cervix.  No lesions noted. minimal white thin discharge.  No associated odor.  Wet prep collected.  Assessment & Plan:   Vaginal  discharge Wet prep negative.  Discussed routine care, recommended for patient to stay well-hydrated, to add probiotics/yogurt into her daily eating schedule, and regular bathing.  Avoid douching.  May use soaps in external area, recommended Dove.  Given history of recurrent bacterial vaginosis (over 3 documented episodes in the past year), prescribed MetroGel.  Stated she does not need to use this gel at this time, however may use if she experiences change in vaginal discharge color/odor/frequency with vaginal irritation similar to previously documented episodes.   Follow-up if symptoms are not improving or worsening.  Leticia Penna, DO Family Medicine Resident PGY-1

## 2018-11-12 ENCOUNTER — Encounter: Payer: Self-pay | Admitting: Family Medicine

## 2018-11-18 ENCOUNTER — Emergency Department (HOSPITAL_COMMUNITY)
Admission: EM | Admit: 2018-11-18 | Discharge: 2018-11-18 | Disposition: A | Discharge status: Home / Self Care | Payer: Medicaid Other | Attending: Emergency Medicine | Admitting: Emergency Medicine

## 2018-11-18 ENCOUNTER — Emergency Department (HOSPITAL_COMMUNITY): Payer: Medicaid Other

## 2018-11-18 ENCOUNTER — Other Ambulatory Visit: Payer: Self-pay

## 2018-11-18 ENCOUNTER — Encounter (HOSPITAL_COMMUNITY): Payer: Self-pay | Admitting: Emergency Medicine

## 2018-11-18 DIAGNOSIS — R29898 Other symptoms and signs involving the musculoskeletal system: Secondary | ICD-10-CM | POA: Insufficient documentation

## 2018-11-18 DIAGNOSIS — R202 Paresthesia of skin: Secondary | ICD-10-CM | POA: Insufficient documentation

## 2018-11-18 DIAGNOSIS — R200 Anesthesia of skin: Secondary | ICD-10-CM | POA: Diagnosis not present

## 2018-11-18 DIAGNOSIS — Z79899 Other long term (current) drug therapy: Secondary | ICD-10-CM | POA: Diagnosis not present

## 2018-11-18 DIAGNOSIS — R531 Weakness: Secondary | ICD-10-CM | POA: Diagnosis not present

## 2018-11-18 LAB — CBC
HCT: 39.3 % (ref 36.0–46.0)
Hemoglobin: 12.2 g/dL (ref 12.0–15.0)
MCH: 28.4 pg (ref 26.0–34.0)
MCHC: 31 g/dL (ref 30.0–36.0)
MCV: 91.4 fL (ref 80.0–100.0)
NRBC: 0 % (ref 0.0–0.2)
Platelets: 251 10*3/uL (ref 150–400)
RBC: 4.3 MIL/uL (ref 3.87–5.11)
RDW: 13.7 % (ref 11.5–15.5)
WBC: 6.7 10*3/uL (ref 4.0–10.5)

## 2018-11-18 LAB — DIFFERENTIAL
Abs Immature Granulocytes: 0.01 10*3/uL (ref 0.00–0.07)
Basophils Absolute: 0 10*3/uL (ref 0.0–0.1)
Basophils Relative: 0 %
Eosinophils Absolute: 0.1 10*3/uL (ref 0.0–0.5)
Eosinophils Relative: 2 %
Immature Granulocytes: 0 %
Lymphocytes Relative: 47 %
Lymphs Abs: 3.2 10*3/uL (ref 0.7–4.0)
Monocytes Absolute: 0.4 10*3/uL (ref 0.1–1.0)
Monocytes Relative: 6 %
Neutro Abs: 3 10*3/uL (ref 1.7–7.7)
Neutrophils Relative %: 45 %

## 2018-11-18 LAB — APTT: aPTT: 29 seconds (ref 24–36)

## 2018-11-18 LAB — COMPREHENSIVE METABOLIC PANEL
ALBUMIN: 3.5 g/dL (ref 3.5–5.0)
ALT: 18 U/L (ref 0–44)
AST: 20 U/L (ref 15–41)
Alkaline Phosphatase: 57 U/L (ref 38–126)
Anion gap: 11 (ref 5–15)
BILIRUBIN TOTAL: 0.3 mg/dL (ref 0.3–1.2)
BUN: 8 mg/dL (ref 6–20)
CO2: 21 mmol/L — ABNORMAL LOW (ref 22–32)
Calcium: 8.8 mg/dL — ABNORMAL LOW (ref 8.9–10.3)
Chloride: 107 mmol/L (ref 98–111)
Creatinine, Ser: 0.81 mg/dL (ref 0.44–1.00)
GFR calc Af Amer: >60 mL/min (ref >60)
GFR calc non Af Amer: >60 mL/min (ref >60)
Glucose, Bld: 116 mg/dL — ABNORMAL HIGH (ref 70–99)
POTASSIUM: 3.8 mmol/L (ref 3.5–5.1)
Sodium: 139 mmol/L (ref 135–145)
TOTAL PROTEIN: 6.4 g/dL — AB (ref 6.5–8.1)

## 2018-11-18 LAB — CBG MONITORING, ED: Glucose-Capillary: 84 mg/dL (ref 70–99)

## 2018-11-18 LAB — PROTIME-INR
INR: 0.92
Prothrombin Time: 12.2 seconds (ref 11.4–15.2)

## 2018-11-18 LAB — I-STAT BETA HCG BLOOD, ED (MC, WL, AP ONLY)

## 2018-11-18 MED ORDER — SODIUM CHLORIDE 0.9% FLUSH: 3.0000 mL | Freq: Once | INTRAVENOUS | Status: DC

## 2018-11-18 NOTE — Progress Notes (Signed)
Orthopedic Tech Progress Note Patient Details:  Nancy Young 22-Jan-1986 [id number]  Ortho Devices Type of Ortho Device: Wrist splint Ortho Device/Splint Interventions: Ordered, Application, Adjustment   Post Interventions Patient Tolerated: Well Instructions Provided: Adjustment of device, Care of device   Jonna Dittrich J Kien Mirsky 11/18/2018, 11:59 AM

## 2018-11-18 NOTE — Discharge Instructions (Signed)
Your work-up today showed no evidence of stroke, multiple sclerosis, or other neurologic or neck cause of your right hand numbness, tingling, weakness.  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  The x-ray did not show fracture dislocation.  Please use the wrist splint and follow-up with your PCP and neurology for further evaluation management.  Please rest.  Please dehydrated.  If any symptoms change or worsen or develop new neurologic complaints, please return to the nearest emergency department medially.

## 2018-11-18 NOTE — ED Triage Notes (Signed)
Patient with right arm numbness since Friday.  She states that she woke up with the numbness and it felt like it was asleep or she slept on it wrong but the numbness feeling never went away.  No double vision, no facial droop, equal hand grips and leg pushes.  She does have weakness in right arm.

## 2018-11-18 NOTE — ED Provider Notes (Signed)
[facility] EMERGENCY DEPARTMENT Provider Note   CSN: [id number] Arrival date & time: 11/18/18  0605     History   Chief Complaint Chief Complaint  Patient presents with  . Numbness    HPI Nancy Young is a 33 y.o. female.  The history is provided by the patient and medical records. No language interpreter was used.  Neurologic Problem  This is a new problem. The current episode started more than 2 days ago (4 days ago). The problem occurs constantly. The problem has not changed since onset.Pertinent negatives include no chest pain, no abdominal pain, no headaches and no shortness of breath. Nothing aggravates the symptoms. Nothing relieves the symptoms. She has tried nothing for the symptoms. The treatment provided no relief.    Past Medical History:  Diagnosis Date  . Obesity   . Vaginal discharge 07/27/2017    Patient Active Problem List   Diagnosis Date Noted  . Vaginal discharge 05/14/2018  . Bacterial vaginosis 07/27/2017  . Health care maintenance 07/02/2017  . Obesity 05/22/2017  . STD exposure 05/22/2017    Past Surgical History:  Procedure Laterality Date  . LEEP  2013     OB History   No obstetric history on file.      Home Medications    Prior to Admission medications   Medication Sig Start Date End Date Taking? Authorizing Provider  fluconazole (DIFLUCAN) 150 MG tablet Take 1 tablet (150 mg total) by mouth daily. Take second dose 72 hours later if symptoms still persists. 08/12/18   Cathie Hoops, Amy V, PA-C  metroNIDAZOLE (METROGEL VAGINAL) 0.75 % vaginal gel Place 1 Applicatorful vaginally 2 (two) times daily. 11/11/18   Allayne Stack, DO  naproxen (NAPROSYN) 500 MG tablet Take 1 tablet (500 mg total) by mouth 2 (two) times daily with a meal. 08/26/18   Westley Chandler, MD    Family History Family History  Problem Relation Age of Onset  . Diabetes Mother   . Hypertension Mother   . Hyperlipidemia Mother   . Bipolar  disorder Mother   . Diabetes Maternal Grandmother   . Sickle cell anemia Maternal Grandmother   . Heart attack Maternal Grandmother   . Anxiety disorder Maternal Grandmother   . Hypertension Maternal Grandmother   . Hyperlipidemia Maternal Grandmother   . Stroke Maternal Grandmother   . Hepatitis Maternal Grandfather   . Lung disease Maternal Grandfather   . Diabetes Paternal Grandmother   . Hypertension Paternal Grandmother   . Hyperlipidemia Paternal Grandmother   . Kidney disease Maternal Aunt   . Diabetes Maternal Aunt   . Hypertension Maternal Aunt   . Stomach cancer Other     Social History Social History   Tobacco Use  . Smoking status: Never Smoker  . Smokeless tobacco: Never Used  Substance Use Topics  . Alcohol use: Yes    Comment: 1-2 drinks per 2-3 times/week  . Drug use: No     Allergies   Patient has no known allergies.   Review of Systems Review of Systems  Constitutional: Negative for chills, diaphoresis, fatigue and fever.  HENT: Negative for congestion, ear pain and sore throat.   Eyes: Negative for pain and visual disturbance.  Respiratory: Negative for cough, chest tightness and shortness of breath.   Cardiovascular: Negative for chest pain and palpitations.  Gastrointestinal: Negative for abdominal pain and vomiting.  Genitourinary: Negative for dysuria, flank pain, frequency and hematuria.  Musculoskeletal: Negative for arthralgias, back pain, neck pain  and neck stiffness.  Skin: Negative for color change, rash and wound.  Neurological: Positive for weakness and numbness. Negative for dizziness, seizures, syncope, facial asymmetry, light-headedness and headaches.  Psychiatric/Behavioral: Negative for agitation, behavioral problems and confusion.  All other systems reviewed and are negative.    Physical Exam Updated Vital Signs BP (!) 112/58 (BP Location: Left Arm)   Pulse 80   Temp 99.1 F (37.3 C) (Oral)   Resp 18   Ht 5\' 3"  (1.6 m)    Wt 95.3 kg   SpO2 100%   BMI 37.20 kg/m   Physical Exam Vitals signs and nursing note reviewed.  Constitutional:      General: She is not in acute distress.    Appearance: She is well-developed. She is not ill-appearing, toxic-appearing or diaphoretic.  HENT:     Head: Normocephalic and atraumatic.     Nose: No congestion or rhinorrhea.     Mouth/Throat:     Pharynx: No oropharyngeal exudate or posterior oropharyngeal erythema.  Eyes:     Conjunctiva/sclera: Conjunctivae normal.     Pupils: Pupils are equal, round, and reactive to light.  Neck:     Musculoskeletal: Neck supple. No neck rigidity or muscular tenderness.  Cardiovascular:     Rate and Rhythm: Normal rate and regular rhythm.     Heart sounds: No murmur.  Pulmonary:     Effort: Pulmonary effort is normal. No respiratory distress.     Breath sounds: Normal breath sounds.  Abdominal:     Palpations: Abdomen is soft.     Tenderness: There is no abdominal tenderness.  Skin:    General: Skin is warm and dry.     Capillary Refill: Capillary refill takes less than 2 seconds.     Findings: No erythema or rash.  Neurological:     Mental Status: She is alert and oriented to person, place, and time.     GCS: GCS eye subscore is 4. GCS verbal subscore is 5. GCS motor subscore is 6.     Cranial Nerves: No cranial nerve deficit.     Sensory: Sensory deficit present.     Motor: Weakness present. No tremor, abnormal muscle tone, seizure activity or pronator drift.     Coordination: Coordination normal. Finger-Nose-Finger Test normal.     Gait: Gait normal.     Comments: Grip strength decreased in the right hand.  No wrist drop.  Deficit in sensation on right compared to left and reported "feeling asleep".  Normal pulse.  Normal cap refill.  Normal strength at elbow and shoulder.  Normal gait and normal finger-nose-finger testing.  Hoffmann's test negative.  Psychiatric:        Mood and Affect: Mood normal.      ED  Treatments / Results  Labs (all labs ordered are listed, but only abnormal results are displayed) Labs Reviewed  COMPREHENSIVE METABOLIC PANEL - Abnormal; Notable for the following components:      Result Value   CO2 21 (*)    Glucose, Bld 116 (*)    Calcium 8.8 (*)    Total Protein 6.4 (*)    All other components within normal limits  PROTIME-INR  APTT  CBC  DIFFERENTIAL  CBG MONITORING, ED  I-STAT BETA HCG BLOOD, ED ([facility], [facility], [facility] ONLY)    EKG None ED ECG REPORT   Date: 11/18/2018  Rate: 65  Rhythm: normal sinus rhythm  QRS Axis: normal  Intervals: normal  ST/T Wave abnormalities: normal  Conduction Disutrbances:none  Narrative Interpretation:   Old EKG Reviewed: unchanged  I have personally reviewed the EKG tracing and agree with the computerized printout as noted.    Radiology Dg Wrist Complete Right  Result Date: 11/18/2018 CLINICAL DATA:  Wrist tingling and numbness EXAM: RIGHT WRIST - COMPLETE 3+ VIEW COMPARISON:  None. FINDINGS: There is no evidence of fracture or dislocation. There is no evidence of arthropathy or other focal bone abnormality. Soft tissues are unremarkable. IMPRESSION: No fracture or dislocation of the right wrist. The carpus is normally aligned. The joint spaces are well preserved. Electronically Signed   By: Lauralyn PrimesAlex  Bibbey M.D.   On: 11/18/2018 08:10   Ct Head Wo Contrast  Result Date: 11/18/2018 CLINICAL DATA:  Numbness and right arm and hand since Friday EXAM: CT HEAD WITHOUT CONTRAST TECHNIQUE: Contiguous axial images were obtained from the base of the skull through the vertex without intravenous contrast. COMPARISON:  None. FINDINGS: Brain: No evidence of acute infarction, hemorrhage, hydrocephalus, extra-axial collection or mass lesion/mass effect. Vascular: No hyperdense vessel or unexpected calcification. Skull: Normal. Negative for fracture or focal lesion. Sinuses/Orbits: Negative IMPRESSION: Negative head CT. Electronically Signed   By:  Marnee SpringJonathon  Watts M.D.   On: 11/18/2018 06:59   Mr Brain Wo Contrast  Result Date: 11/18/2018 CLINICAL DATA:  Acute onset of RIGHT arm numbness and decreased grip strength for 4 days. Evaluate for stroke versus peripheral neuropathy. EXAM: MRI HEAD WITHOUT CONTRAST TECHNIQUE: Multiplanar, multiecho pulse sequences of the brain and surrounding structures were obtained without intravenous contrast. COMPARISON:  None. FINDINGS: Brain: No evidence for acute infarction, hemorrhage, mass lesion, hydrocephalus, or extra-axial fluid. Normal cerebral volume. No white matter disease. No congenital anomaly. Vascular: Flow voids are maintained throughout the carotid, basilar, and vertebral arteries. There are no areas of chronic hemorrhage. Skull and upper cervical spine: Normal skull base marrow signal. No cervical spine compressive lesion. No tonsillar herniation. Sinuses/Orbits: No significant sinus fluid.  Negative orbits. Other: None. IMPRESSION: No acute intracranial abnormality. Negative exam. Specifically, no evidence for stroke or intracranial mass lesion. Electronically Signed   By: Elsie StainJohn T Curnes M.D.   On: 11/18/2018 09:24   Mr Cervical Spine Wo Contrast  Result Date: 11/18/2018 CLINICAL DATA:  Right hand numbness and weakness beginning on Friday. EXAM: MRI CERVICAL SPINE WITHOUT CONTRAST TECHNIQUE: Multiplanar, multisequence MR imaging of the cervical spine was performed. No intravenous contrast was administered. COMPARISON:  None. FINDINGS: Alignment: Normal Vertebrae: No fracture, evidence of discitis, or bone lesion. Cord: Normal signal and morphology. Posterior Fossa, vertebral arteries, paraspinal tissues: Negative. Disc levels: No herniation or impingement.  Negative for facet spurring. IMPRESSION: Negative cervical MRI. Electronically Signed   By: Marnee SpringJonathon  Watts M.D.   On: 11/18/2018 09:38    Procedures Procedures (including critical care time)  Medications Ordered in ED Medications  sodium  chloride flush (NS) 0.9 % injection 3 mL (0 mLs Intravenous Hold 11/18/18 0731)     Initial Impression / Assessment and Plan / ED Course  I have reviewed the triage vital signs and the nursing notes.  Pertinent labs & imaging results that were available during my care of the patient were reviewed by me and considered in my medical decision making (see chart for details).     Nancy Young is a 33 y.o. female with a past medical history significant for obesity who presents with right hand weakness and numbness.  Patient reports that she has a strong family history of strokes and is concerned about stroke.  She reports  that she woke up Friday morning and has had numbness and weakness in her right hand from the elbow down.  She reports is never had this before.  She is unsure if she slept on it wrong.  She denies any recent trauma.  She denies any pain in her head, neck, or chest.  She does report some mild right wrist pain.  She denies any known trauma to this location.  She denies any fevers, chills, chest pain, shortness breath, nausea vomiting, vision changes.  She denies any history of strokes.  She presents for evaluation of 4 days of the numbness and weakness in her dominant hand.  On exam, patient does have decreased grip strength in the right compared to left.  Patient has some mild tenderness in the wrist but normal wrist range of motion.  No wrist drop present.  Patient has no weakness of the elbow or the shoulder.  Patient has the subjective paresthesia tingling sensation in her forearm and wrist and hand on the right.  Normal cap refill and no rash or discoloration seen.  Lungs clear chest nontender.  Abdomen nontender.  No other focal neurologic deficit seen on exam.  Normal gait.  Patient had a work-up initiated in triage.  Patient had a normal head CT.  Clinically I suspect she has a peripheral neuropathy however given her reported strong family history of stroke, will obtain MRI  to rule out stroke as etiology.  Anticipate reassessment after work-up.        11:26 AM  MRI of head and neck showed no stroke, MS, or other significant abnormality.  Wrist x-ray was reassuring.  Suspect peripheral neuropathy potentially from compression on her wrist overnight.  Patient will be placed in a wrist splint to help with the pain and possible sprain.  She will follow-up with PCP and outpatient neurology for the numbness and weakness.  Patient given work note and will rest.  Patient other questions or concerns and was discharged in good condition after reassuring work-up.  Final Clinical Impressions(s) / ED Diagnoses   Final diagnoses:  Paresthesia  Numbness and tingling in right hand  Right hand weakness    ED Discharge Orders    None    `  Clinical Impression: 1. Paresthesia   2. Numbness and tingling in right hand   3. Right hand weakness     Disposition: Discharge  Condition: Good  I have discussed the results, Dx and Tx plan with the pt(& family if present). He/she/they expressed understanding and agree(s) with the plan. Discharge instructions discussed at great length. Strict return precautions discussed and pt &/or family have verbalized understanding of the instructions. No further questions at time of discharge.    New Prescriptions   No medications on file    Follow Up: [facility] [address]     [address] [place] [address] [phone]    [facility] EMERGENCY DEPARTMENT [address] [id number] mc [place] [address] [phone]    [facility] [address] [place] [address] [phone] Schedule an appointment as soon as possible for a visit       , Canary Brim, MD 11/18/18 1126

## 2018-12-05 ENCOUNTER — Emergency Department (HOSPITAL_COMMUNITY)
Admission: EM | Admit: 2018-12-05 | Discharge: 2018-12-05 | Disposition: A | Discharge status: Home / Self Care | Payer: Medicaid Other | Attending: Emergency Medicine | Admitting: Emergency Medicine

## 2018-12-05 ENCOUNTER — Encounter (HOSPITAL_COMMUNITY): Payer: Self-pay | Admitting: Emergency Medicine

## 2018-12-05 DIAGNOSIS — S61232A Puncture wound without foreign body of right middle finger without damage to nail, initial encounter: Secondary | ICD-10-CM | POA: Insufficient documentation

## 2018-12-05 DIAGNOSIS — Y9389 Activity, other specified: Secondary | ICD-10-CM | POA: Insufficient documentation

## 2018-12-05 DIAGNOSIS — W461XXA Contact with contaminated hypodermic needle, initial encounter: Secondary | ICD-10-CM | POA: Insufficient documentation

## 2018-12-05 DIAGNOSIS — Y92538 Other ambulatory health services establishments as the place of occurrence of the external cause: Secondary | ICD-10-CM | POA: Insufficient documentation

## 2018-12-05 DIAGNOSIS — Y990 Civilian activity done for income or pay: Secondary | ICD-10-CM | POA: Insufficient documentation

## 2018-12-05 DIAGNOSIS — Z79899 Other long term (current) drug therapy: Secondary | ICD-10-CM | POA: Diagnosis not present

## 2018-12-05 DIAGNOSIS — Z7721 Contact with and (suspected) exposure to potentially hazardous body fluids: Secondary | ICD-10-CM | POA: Insufficient documentation

## 2018-12-05 DIAGNOSIS — Z23 Encounter for immunization: Secondary | ICD-10-CM | POA: Diagnosis not present

## 2018-12-05 DIAGNOSIS — S6991XA Unspecified injury of right wrist, hand and finger(s), initial encounter: Secondary | ICD-10-CM | POA: Diagnosis present

## 2018-12-05 LAB — COMPREHENSIVE METABOLIC PANEL
ALT: 21 U/L (ref 0–44)
AST: 22 U/L (ref 15–41)
Albumin: 3.6 g/dL (ref 3.5–5.0)
Alkaline Phosphatase: 64 U/L (ref 38–126)
Anion gap: 7 (ref 5–15)
BUN: 12 mg/dL (ref 6–20)
CO2: 24 mmol/L (ref 22–32)
Calcium: 9.3 mg/dL (ref 8.9–10.3)
Chloride: 109 mmol/L (ref 98–111)
Creatinine, Ser: 0.83 mg/dL (ref 0.44–1.00)
GFR calc Af Amer: >60 mL/min (ref >60)
GFR calc non Af Amer: >60 mL/min (ref >60)
Glucose, Bld: 101 mg/dL — ABNORMAL HIGH (ref 70–99)
Potassium: 3.9 mmol/L (ref 3.5–5.1)
Sodium: 140 mmol/L (ref 135–145)
Total Bilirubin: 0.2 mg/dL — ABNORMAL LOW (ref 0.3–1.2)
Total Protein: 6.7 g/dL (ref 6.5–8.1)

## 2018-12-05 LAB — POC URINE PREG, ED: Preg Test, Ur: NEGATIVE

## 2018-12-05 MED ORDER — ELVITEG-COBIC-EMTRICIT-TENOFAF 150-150-200-10 MG PO TABS: 1.0000 | ORAL_TABLET | Freq: Every day | ORAL | Status: DC

## 2018-12-05 MED ORDER — TETANUS-DIPHTH-ACELL PERTUSSIS 5-2.5-18.5 LF-MCG/0.5 IM SUSP
0.5000 mL | Freq: Once | INTRAMUSCULAR | Status: AC
  Administered 2018-12-05: 0.5 mL via INTRAMUSCULAR
  Filled 2018-12-05: qty 0.5

## 2018-12-05 MED ORDER — ELVITEG-COBIC-EMTRICIT-TENOFAF 150-150-200-10 MG PO TABS: 1.0000 | ORAL_TABLET | Freq: Every day | ORAL | 0 refills | Status: DC

## 2018-12-05 NOTE — ED Provider Notes (Signed)
[facility] EMERGENCY DEPARTMENT Provider Note   CSN: [id number] Arrival date & time: 12/05/18  1024    History   Chief Complaint Chief Complaint  Patient presents with  . Body Fluid Exposure    HPI Nancy Young is a 33 y.o. female.     HPI   33 year old female presents today with complaints of needlestick injury.  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  She was pulling a needle out of the patient yesterday when the patient jerked causing her to poke her right third digit.  She notes she immediately cleansed the area with soap and water.  She is uncertain when her last tetanus shot was.  She denies any chronic health conditions.  She is uncertain of the status including hepatitis C, hepatitis B of the patient, she believes that the patient does not have HIV.  Past Medical History:  Diagnosis Date  . Obesity   . Vaginal discharge 07/27/2017    Patient Active Problem List   Diagnosis Date Noted  . Vaginal discharge 05/14/2018  . Bacterial vaginosis 07/27/2017  . Health care maintenance 07/02/2017  . Obesity 05/22/2017  . STD exposure 05/22/2017    Past Surgical History:  Procedure Laterality Date  . LEEP  2013     OB History   No obstetric history on file.      Home Medications    Prior to Admission medications   Medication Sig Start Date End Date Taking? Authorizing Provider  acetaminophen (TYLENOL) 500 MG tablet Take 1,000 mg by mouth every 6 (six) hours as needed for mild pain or headache.    [provider]  elvitegravir-cobicistat-emtricitabine-tenofovir (GENVOYA) 150-150-200-10 MG TABS tablet Take 1 tablet by mouth daily with breakfast. 12/05/18   Dianelys Scinto, Tinnie Gens, PA-C  fluconazole (DIFLUCAN) 150 MG tablet Take 1 tablet (150 mg total) by mouth daily. Take second dose 72 hours later if symptoms still persists. Patient not taking: Reported on 11/18/2018 08/12/18   Belinda Fisher, PA-C  metroNIDAZOLE (METROGEL VAGINAL)  0.75 % vaginal gel Place 1 Applicatorful vaginally 2 (two) times daily. 11/11/18   Allayne Stack, DO  naproxen (NAPROSYN) 500 MG tablet Take 1 tablet (500 mg total) by mouth 2 (two) times daily with a meal. Patient not taking: Reported on 11/18/2018 08/26/18   Westley Chandler, MD    Family History Family History  Problem Relation Age of Onset  . Diabetes Mother   . Hypertension Mother   . Hyperlipidemia Mother   . Bipolar disorder Mother   . Diabetes Maternal Grandmother   . Sickle cell anemia Maternal Grandmother   . Heart attack Maternal Grandmother   . Anxiety disorder Maternal Grandmother   . Hypertension Maternal Grandmother   . Hyperlipidemia Maternal Grandmother   . Stroke Maternal Grandmother   . Hepatitis Maternal Grandfather   . Lung disease Maternal Grandfather   . Diabetes Paternal Grandmother   . Hypertension Paternal Grandmother   . Hyperlipidemia Paternal Grandmother   . Kidney disease Maternal Aunt   . Diabetes Maternal Aunt   . Hypertension Maternal Aunt   . Stomach cancer Other     Social History Social History   Tobacco Use  . Smoking status: Never Smoker  . Smokeless tobacco: Never Used  Substance Use Topics  . Alcohol use: Yes    Comment: 1-2 drinks per 2-3 times/week  . Drug use: No     Allergies   Patient has no known allergies.   Review of Systems  Review of Systems  All other systems reviewed and are negative.    Physical Exam Updated Vital Signs BP 119/68 (BP Location: Right Arm)   Pulse 66   Temp 98 F (36.7 C) (Oral)   Resp 18   Ht  (1.6 m)   Wt 94.8 kg   SpO2 99%   BMI 37.02 kg/m   Physical Exam Vitals signs and nursing note reviewed.  Constitutional:      Appearance: She is well-developed.  HENT:     Head: Normocephalic and atraumatic.  Eyes:     General: No scleral icterus.       Right eye: No discharge.        Left eye: No discharge.     Conjunctiva/sclera: Conjunctivae normal.     Pupils: Pupils are  equal, round, and reactive to light.  Neck:     Musculoskeletal: Normal range of motion.     Vascular: No JVD.     Trachea: No tracheal deviation.  Pulmonary:     Effort: Pulmonary effort is normal.     Breath sounds: No stridor.  Musculoskeletal:     Comments: Small puncture along the right third finger, no signs of surrounding infection  Neurological:     Mental Status: She is alert and oriented to person, place, and time.     Coordination: Coordination normal.  Psychiatric:        Behavior: Behavior normal.        Thought Content: Thought content normal.        Judgment: Judgment normal.      ED Treatments / Results  Labs (all labs ordered are listed, but only abnormal results are displayed) Labs Reviewed  COMPREHENSIVE METABOLIC PANEL - Abnormal; Notable for the following components:      Result Value   Glucose, Bld 101 (*)    Total Bilirubin 0.2 (*)    All other components within normal limits  HEPATITIS C ANTIBODY  HEPATITIS B SURFACE ANTIGEN  RPR  HIV ANTIBODY (ROUTINE TESTING W REFLEX)  POC URINE PREG, ED    EKG None  Radiology No results found.  Procedures Procedures (including critical care time)  Medications Ordered in ED Medications  Tdap (BOOSTRIX) injection 0.5 mL (0.5 mLs Intramuscular Given 12/05/18 1135)     Initial Impression / Assessment and Plan / ED Course  I have reviewed the triage vital signs and the nursing notes.  Pertinent labs & imaging results that were available during my care of the patient were reviewed by me and considered in my medical decision making (see chart for details).        32 year old female presents today status post needlestick.  Patient believes that these patient does not have HIV.  I have encouraged her return to work and verify that this patient has been tested recently, I would encourage her to have repeat testing again today.  I have discussed using the preventative HIV medication at this time to prevent  any infection.  Patient has been tested for hepatitis B and hepatitis C.  She will need repeat testing and evaluation she will follow-up as an outpatient for this, she will return immediately she develops any new or worsening signs or symptoms.  She verbalized understanding and agreement to today's plan.  Final Clinical Impressions(s) / ED Diagnoses   Final diagnoses:  Exposure to blood    ED Discharge Orders         Ordered    elvitegravir-cobicistat-emtricitabine-tenofovir (GENVOYA) 150-150-200-10 MG TABS  tablet  Daily with breakfast     12/05/18 1227           Rosalio Loud 12/05/18 1518    Raeford Razor, MD 12/05/18 [phone]

## 2018-12-05 NOTE — ED Triage Notes (Signed)
Pt states she was stuck by a needle from one of her pts at the kidney center yesterday. Pt does not know medical hx of the pt and pt was not tested. Pt was stuck on middle finger on right hand.

## 2018-12-05 NOTE — Discharge Instructions (Signed)
Please read the attached information.  I highly encourage you to follow-up with your workplace and verify HIV status through testing of the patient.  I have provided you with the appropriate medication to prevent transmission of HIV and recommend you take this if you are unable to verify a negative HIV status.  He will need follow-up testing as I discussed for hepatitis B and C, also you will need to review your pending lab results with medical professional.  Please return immediately if develop any new or worsening signs or symptoms.

## 2018-12-05 NOTE — ED Notes (Signed)
Declined W/C at D/C and was escorted to lobby by RN. 

## 2018-12-06 LAB — HEPATITIS C ANTIBODY: HCV Ab: <0.1 s/co ratio (ref 0.0–0.9)

## 2018-12-06 LAB — HEPATITIS B SURFACE ANTIGEN: Hepatitis B Surface Ag: NEGATIVE

## 2018-12-06 LAB — HIV ANTIBODY (ROUTINE TESTING W REFLEX): HIV Screen 4th Generation wRfx: NONREACTIVE

## 2018-12-06 LAB — RPR: RPR Ser Ql: NONREACTIVE

## 2018-12-26 ENCOUNTER — Telehealth: Payer: Self-pay | Admitting: Family Medicine

## 2018-12-26 MED ORDER — FLUCONAZOLE 150 MG PO TABS: 150.0000 mg | ORAL_TABLET | Freq: Every day | ORAL | 0 refills | Status: DC

## 2018-12-26 MED ORDER — METRONIDAZOLE 500 MG PO TABS: 500.0000 mg | ORAL_TABLET | Freq: Two times a day (BID) | ORAL | 0 refills | Status: AC

## 2018-12-26 NOTE — Telephone Encounter (Addendum)
Telephone Encounter 12/26/2018, 9:03 AM  PCP: Leland Her, DO Historian/Reporter: Self Call type: Incoming   CC: "I have a bacterial infection and carpal tunnel"  HPI:  Nancy Young is a 33 y.o. female with past medical history significant for recurrent bacterial vaginosis and carpal tunnel.   Bacterial vaginosis BV symptoms that started two weeks ago. Discharge is "creamy" & "smokey, dark white". No itching. Currently sexually active. No new sexual partner.  Since that she has several previous episodes, she has been trying some prophylactic measures including starting activity and drinking more water.  She reports she gets BV every 1 to 2 months.  Symptoms completely resolve after taking Flagyl.  She also reports that she does get yeast infections after taking Flagyl and also requests Diflucan.  Carpal Tunnel  Patient has been having problems with carpal tunnel for the past 2 to 3 years.  Has been treated with conservative management with splints and anti-inflammatories.  Patient has not used any injections in the past.  Patient reports that for the last 1 and half weeks, carpal tunnel has been flaring.  It has been worsening, especially at work.  She reports that she will typically time how long symptoms last for.  Yesterday, she reported her hand locked up for about 5 minutes, which is the longest that she has ever experienced this symptom for her.  She has been experiencing these locking up episodes about twice a day.  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  Patient reports that she has tried me naproxen, Motrin without help.  She has been wearing her brace through the day and through the night.  She denies any changes in temperature of her hand or fingers, but does have numbness and tingling in her first 3 fingers.  Medications & Allergies:  Reviewed in chart Pertinent Medical, Family and Social  History: Reviewed and taken into consideration in the context of this patient's complaint.   Objective:  Pertinent Labs & Imaging:  Reviewed in chart   Assessment & Plan:  BV Given that patient has had several episodes of BV in the past and is currently symptomatic, will treat empirically with Flagyl x1 week.  Will also send Diflucan as previously prescribed.  If patient symptoms are not improved, she will call back to schedule appointment.  Carpal tunnel Patient treated with conservative measures previously that are not currently helping.  As this is also keeping her from work, patient would like to come in for steroid injection.  Patient scheduled with Dr. Nelson Chimes on 3/23 at 1:30 pm. Dr. Manson Passey is available to help with injection if needed. Additionally, SM fellow will be precepting that day.   Genia Hotter, M.D. [facility]  PGY -1 12/26/2018, 9:03 AM

## 2018-12-30 ENCOUNTER — Telehealth: Payer: Self-pay

## 2018-12-30 ENCOUNTER — Encounter: Payer: Self-pay | Admitting: Student in an Organized Health Care Education/Training Program

## 2018-12-30 ENCOUNTER — Ambulatory Visit (INDEPENDENT_AMBULATORY_CARE_PROVIDER_SITE_OTHER): Payer: Medicaid Other | Admitting: Student in an Organized Health Care Education/Training Program

## 2018-12-30 ENCOUNTER — Other Ambulatory Visit: Payer: Self-pay

## 2018-12-30 VITALS — BP 118/78 | HR 75 | Temp 98.3°F | Ht 63.0 in | Wt 212.8 lb

## 2018-12-30 DIAGNOSIS — G5602 Carpal tunnel syndrome, left upper limb: Secondary | ICD-10-CM

## 2018-12-30 MED ORDER — METHYLPREDNISOLONE ACETATE 40 MG/ML IJ SUSP
40.0000 mg | Freq: Once | INTRAMUSCULAR | Status: AC
  Administered 2018-12-30: 40 mg via INTRAMUSCULAR

## 2018-12-30 NOTE — Telephone Encounter (Signed)
Scheduled with Mosetta Putt at 150.

## 2018-12-30 NOTE — Telephone Encounter (Signed)
Patient called nurse line stating her apt is suppose to be this afternoon for hand injections. Pt is upset, today is her only day off. Pt is unable to come in on Wednesday and would like her hand injections this afternoon. Do you know who I can put her with? Pt stated she was given Amin bc she was only one who could do them??

## 2018-12-30 NOTE — Telephone Encounter (Signed)
Discussed with Dr. Jennette Kettle and Dr. Mosetta Putt.  Please schedule with Dr. Mosetta Putt this afternoon- we will perform injection if appropriate.   Terisa Starr, MD  Family Medicine Teaching Service

## 2018-12-30 NOTE — Telephone Encounter (Signed)
Per Dr Selena Batten phone note, patient appt was supposed to be this afternoon for carpal tunnel injection by Dr. Nelson Chimes with Dr. Manson Passey to help. It appears that she was scheduled on 3/25. As this is not a procedure I perform, please advise.

## 2018-12-30 NOTE — Patient Instructions (Signed)
It was a pleasure seeing you today in our clinic. Today we did a steroid injection of the wrist for your carpal tunnel symptoms.  If you develop warmth or redness over the wrist or fevers please call our clinic.  Our clinic's number is [phone]. Please call with questions or concerns about what we discussed today.  Be well, Dr. Mosetta Putt

## 2018-12-31 ENCOUNTER — Encounter: Payer: Self-pay | Admitting: Student in an Organized Health Care Education/Training Program

## 2018-12-31 DIAGNOSIS — G5600 Carpal tunnel syndrome, unspecified upper limb: Secondary | ICD-10-CM | POA: Insufficient documentation

## 2018-12-31 NOTE — Assessment & Plan Note (Signed)
-   no red flags for joint infection - left carpal tunnel steroid injection today in the office - continue ibupron PRN pain - encourage continued movement at the joint - return precautions provided - follow up as needed

## 2018-12-31 NOTE — Progress Notes (Signed)
   CC: left hand carpal tunnel  HPI: Nancy Young is a 33 y.o. female  Patient Active Problem List   Diagnosis Date Noted  . Carpal tunnel syndrome 12/31/2018  . Obesity 05/22/2017  . STD exposure 05/22/2017   Patient was previously diagnosed with left hand carpal tunnel syndrome. She has attempted management with splinting without improvement in symptoms. She presents today for steroid injection. She does not have any current fevers. No wrist joint pain. She feels overall well.  Review of Symptoms:  See HPI for ROS.   CC, SH/smoking status, and VS noted.  Objective: BP 118/78   Pulse 75   Temp 98.3 F (36.8 C) (Oral)   Ht 5\' 3"  (1.6 m)   Wt 212 lb 12.8 oz (96.5 kg)   SpO2 99%   BMI 37.70 kg/m  GEN: NAD, alert, cooperative, and pleasant. RESPIRATORY: Comfortable work of breathing, speaks in full sentences CV: Regular rate noted, distal extremities well perfused and warm without edema GI: Soft, nondistended SKIN: warm and dry, no rashes or lesions NEURO: II-XII grossly intact MSK: Moves 4 extremities equally PSYCH: AAOx3, appropriate affect   INJECTION: Left Wrist Patient was given informed consent, signed copy in the chart. Appropriate time out was taken. Area prepped and draped in usual sterile fashion. 1 cc of methylprednisolone 40 mg/ml plus  2 cc of 1% lidocaine without epinephrine was injected into the left anterior wrist using a(n) ulnar approach. The patient tolerated the procedure well. There were no complications. Post procedure instructions were given. Dr. Durwin Nora, the sports medicine fellow was available as preceptor during the procedure.  Assessment and plan:  Carpal tunnel syndrome - no red flags for joint infection - left carpal tunnel steroid injection today in the office - continue ibupron PRN pain - encourage continued movement at the joint - return precautions provided - follow up as needed  Meds ordered this encounter  Medications  .  methylPREDNISolone acetate (DEPO-MEDROL) injection 40 mg   Howard Pouch, MD,MS,  PGY3 12/31/2018 8:55 AM

## 2019-01-01 ENCOUNTER — Ambulatory Visit: Payer: Medicaid Other | Admitting: Family Medicine

## 2019-01-04 ENCOUNTER — Encounter (HOSPITAL_COMMUNITY): Payer: Self-pay

## 2019-01-04 ENCOUNTER — Other Ambulatory Visit: Payer: Self-pay

## 2019-01-04 ENCOUNTER — Ambulatory Visit (HOSPITAL_COMMUNITY)
Admission: EM | Admit: 2019-01-04 | Discharge: 2019-01-04 | Disposition: A | Discharge status: Home / Self Care | Payer: Medicaid Other | Attending: Family Medicine | Admitting: Family Medicine

## 2019-01-04 DIAGNOSIS — G5601 Carpal tunnel syndrome, right upper limb: Secondary | ICD-10-CM | POA: Diagnosis not present

## 2019-01-04 MED ORDER — PREDNISONE 20 MG PO TABS: 20.0000 mg | ORAL_TABLET | Freq: Two times a day (BID) | ORAL | 0 refills | Status: AC

## 2019-01-04 NOTE — ED Notes (Signed)
Patient verbalizes understanding of discharge instructions. Opportunity for questioning and answers were provided. Patient discharged from [facility] by PA.  

## 2019-01-04 NOTE — ED Provider Notes (Signed)
[facility]   [id number] 01/04/19 Arrival Time: 1008  CC: Hand numbness  SUBJECTIVE: History from: patient. Nancy Young is a 33 y.o. female hx significant for carpal tunnel syndrome, complains of right hand pain and numbness that began 3-4 days ago.  States she received an injection earlier this week by her PCP. 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 Localizes the pain/ numbness to the first three fingers.  Describes the pain as intermittent and "feels like its asleep" in character.  Pain is 7/10.  Symptoms are made worse with repetitive movements.  Denies similar symptoms in the past.  Denies fever, chills, erythema, ecchymosis, effusion, weakness, numbness and tingling.      ROS: As per HPI.  Past Medical History:  Diagnosis Date  . Obesity   . Vaginal discharge 07/27/2017   Past Surgical History:  Procedure Laterality Date  . LEEP  2013   No Known Allergies No current facility-administered medications on file prior to encounter.    Current Outpatient Medications on File Prior to Encounter  Medication Sig Dispense Refill  . acetaminophen (TYLENOL) 500 MG tablet Take 1,000 mg by mouth every 6 (six) hours as needed for mild pain or headache.    . elvitegravir-cobicistat-emtricitabine-tenofovir (GENVOYA) 150-150-200-10 MG TABS tablet Take 1 tablet by mouth daily with breakfast. 30 tablet 0  . fluconazole (DIFLUCAN) 150 MG tablet Take 1 tablet (150 mg total) by mouth daily. Take second dose 72 hours later if symptoms still persists. 2 tablet 0  . metroNIDAZOLE (METROGEL VAGINAL) 0.75 % vaginal gel Place 1 Applicatorful vaginally 2 (two) times daily. 70 g 0   Social History   Socioeconomic History  . Marital status: Single    Spouse name: Not on file  . Number of children: Not on file  . Years of education: Not on file  . Highest education level: Not on file  Occupational History  . Occupation: CNA  Social Needs  .  Financial resource strain: Not on file  . Food insecurity:    Worry: Not on file    Inability: Not on file  . Transportation needs:    Medical: Not on file    Non-medical: Not on file  Tobacco Use  . Smoking status: Never Smoker  . Smokeless tobacco: Never Used  Substance and Sexual Activity  . Alcohol use: Yes    Comment: 1-2 drinks per 2-3 times/week  . Drug use: No  . Sexual activity: Yes    Partners: Female    Birth control/protection: None  Lifestyle  . Physical activity:    Days per week: Not on file    Minutes per session: Not on file  . Stress: Not on file  Relationships  . Social connections:    Talks on phone: Not on file    Gets together: Not on file    Attends religious service: Not on file    Active member of club or organization: Not on file    Attends meetings of clubs or organizations: Not on file    Relationship status: Not on file  . Intimate partner violence:    Fear of current or ex partner: Not on file    Emotionally abused: Not on file    Physically abused: Not on file    Forced sexual activity: Not on file  Other Topics Concern  . Not on file  Social History Narrative   Lives with son   Family History  Problem  Relation Age of Onset  . Diabetes Mother   . Hypertension Mother   . Hyperlipidemia Mother   . Bipolar disorder Mother   . Diabetes Maternal Grandmother   . Sickle cell anemia Maternal Grandmother   . Heart attack Maternal Grandmother   . Anxiety disorder Maternal Grandmother   . Hypertension Maternal Grandmother   . Hyperlipidemia Maternal Grandmother   . Stroke Maternal Grandmother   . Hepatitis Maternal Grandfather   . Lung disease Maternal Grandfather   . Diabetes Paternal Grandmother   . Hypertension Paternal Grandmother   . Hyperlipidemia Paternal Grandmother   . Kidney disease Maternal Aunt   . Diabetes Maternal Aunt   . Hypertension Maternal Aunt   . Stomach cancer Other     OBJECTIVE:  Vitals:   01/04/19 1018  01/04/19 1019  BP:  (!) 142/90  Pulse:  86  Resp:  16  Temp:  98.6 F (37 C)  TempSrc:  Oral  SpO2:  99%  Weight: 212 lb 12.8 oz (96.5 kg)   Height: 5\' 3"  (1.6 m)     General appearance: AOx3; in no acute distress.  Head: NCAT Lungs: normal respirations CV: Radial pulses 2+ bilaterally. Musculoskeletal: Right hand Inspection: Skin warm, dry, clear and intact without obvious erythema, effusion, or ecchymosis.  Palpation: NTTP ROM: FROM active and passive Strength: grip strength 4+/5 +phalens Skin: warm and dry Neurologic: Ambulates without difficulty Psychological: alert and cooperative; normal mood and affect  ASSESSMENT & PLAN:  1. Carpal tunnel syndrome of right wrist     Meds ordered this encounter  Medications  . predniSONE (DELTASONE) 20 MG tablet    Sig: Take 1 tablet (20 mg total) by mouth 2 (two) times daily with a meal for 5 days.    Dispense:  10 tablet    Refill:  0    Order Specific Question:   Supervising Provider    Answer:   Eustace Moore [[id number]]   Wear carpal tunnel brace 24/7.  Work restrictions for 1 week given Continue conservative management of rest, ice, and gentle stretches Prednisone prescribed.  Take as directed and to completion Follow up with PCP or with hand specialist if symptoms persist Return or go to the ER if you have any new or worsening symptoms (fever, chills, worsening symptoms, redness, swelling, etc...)   Reviewed expectations re: course of current medical issues. Questions answered. Outlined signs and symptoms indicating need for more acute intervention. Patient verbalized understanding. After Visit Summary given.    Rennis Harding, PA-C 01/04/19 1158

## 2019-01-04 NOTE — Discharge Instructions (Signed)
Wear carpal tunnel brace 24/7.  Work restrictions for 1 week given Continue conservative management of rest, ice, and gentle stretches Prednisone prescribed.  Take as directed and to completion Follow up with PCP or with hand specialist if symptoms persist Return or go to the ER if you have any new or worsening symptoms (fever, chills, worsening symptoms, redness, swelling, etc...)

## 2019-01-04 NOTE — ED Triage Notes (Signed)
Patient presents to Urgent Care with complaints of right hand numbness and pain since 3-4 days ago. 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

## 2019-01-16 ENCOUNTER — Telehealth: Payer: Self-pay | Admitting: Family Medicine

## 2019-01-16 NOTE — Telephone Encounter (Signed)
  Patient called concerning respiratory symptoms and concern for coronavirus.  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  She states her symptoms started on Monday.  The symptoms include chest tightness, nonproductive cough, fatigue, nasal congestion, sore throat that "comes and goes".  She states she has dyspnea on exertion, having to take deep breaths when she walks and this causes her chest to hurt.  No wheezing.  She states she had a temperature of 100.1 on Monday, but temperature has been normal otherwise.  Patient has no history of heart failure, asthma, COPD.  Spoke with the emergency department who stated that since she is afebrile and not having respiratory distress, she does not need to come in to the ED.  Advised patient to stay at home and not return to work for 7 days since the onset of symptoms or 3 days after fever which ever is longest.  Patient states she has not scheduled to go back to work until next Monday anyway, which would be 7 days from onset of symptoms.

## 2019-02-18 ENCOUNTER — Ambulatory Visit: Payer: Medicaid Other | Admitting: Family Medicine

## 2019-02-20 ENCOUNTER — Encounter: Payer: Self-pay | Admitting: Family Medicine

## 2019-02-20 ENCOUNTER — Other Ambulatory Visit: Payer: Self-pay

## 2019-02-20 ENCOUNTER — Ambulatory Visit (INDEPENDENT_AMBULATORY_CARE_PROVIDER_SITE_OTHER): Payer: Medicaid Other | Admitting: Family Medicine

## 2019-02-20 VITALS — BP 126/78 | HR 77

## 2019-02-20 DIAGNOSIS — N898 Other specified noninflammatory disorders of vagina: Secondary | ICD-10-CM | POA: Diagnosis not present

## 2019-02-20 DIAGNOSIS — Z634 Disappearance and death of family member: Secondary | ICD-10-CM | POA: Diagnosis not present

## 2019-02-20 LAB — POCT WET PREP (WET MOUNT)
Clue Cells Wet Prep Whiff POC: NEGATIVE
Trichomonas Wet Prep HPF POC: ABSENT

## 2019-02-20 LAB — POCT URINE PREGNANCY: Preg Test, Ur: NEGATIVE

## 2019-02-20 MED ORDER — FLUCONAZOLE 150 MG PO TABS: 150.0000 mg | ORAL_TABLET | Freq: Every day | ORAL | 0 refills | Status: DC

## 2019-02-20 MED ORDER — METRONIDAZOLE 500 MG PO TABS: 500.0000 mg | ORAL_TABLET | Freq: Two times a day (BID) | ORAL | 0 refills | Status: AC

## 2019-02-20 NOTE — Progress Notes (Signed)
  CC: BV, stress  HPI  Frequent BV. Sexually active w one female who doesn't have BV ever really. Mom also had frequent BV. Tried an oral pill started w an A. Frequently gets yeast after BV treatment. Uses summers eve soap. Not inside the vagina. More discharge in the morning, about a week. No odor.   She works at [employer] and had a needle stick, went to the ED. The patient tested negative for HIV, so she not pick up the post exp prophlyaxis.  Doesn't normally take blood pressure meds, BP has been high the last couple of days (doesn't recall the number) she thinks due to stress. Her mom died on last 2023/01/31. She had to perform CPR and has since felt a heaviness in her chest. Mom had had an amputation 2/2 wound, not DM. No known CAD. No preceeding sxs. Mom's COVID test was negative.   Does not want to do self swab for G/c. UTD HIV/RPR screen in February.   ROS: Denies CP, SOB, abdominal pain, dysuria, changes in BMs.   CC, SH/smoking status, and VS noted  Objective: BP 126/78   Pulse 77   LMP 01/24/2019 (Exact Date)   SpO2 97%  Gen: NAD, alert, cooperative, and pleasant. HEENT: NCAT, EOMI, PERRL CV: RRR, no murmur Resp: CTAB, no wheezes, non-labored Abd: SNTND, BS present, no guarding or organomegaly Ext: No edema, warm Neuro: Alert and oriented, Speech clear, No gross deficits  Assessment and plan:  BV: treat with flagyl as patient reports classic sxs for her, despite no clue cells. Will give diflucan as well as she frequently gets yeast after flagyl.   Sudden death of family member Tragically, her mom died of what sounds like a sudden cardiac etiology last week. Pt is greiving and stressed. Intermittent chest heaviness, but sounds atypical as nonexertional and nonradiating. She will monitor the chest heaviness and call us back if worsening or changing. For grief, she will call [facility] for free grief counseling. As she now has a first degree relative with young sudden death, we will  get lipids today and refer to cards. Previous EKG just 2 months ago was appropriate.    Orders Placed This Encounter  Procedures  . Lipid panel  . Ambulatory referral to Cardiology    Referral Priority:   Routine    Referral Type:   Consultation    Referral Reason:   Specialty Services Required    Requested Specialty:   Cardiology    Number of Visits Requested:   1  . POCT urine pregnancy  . POCT Wet Prep [facility])    Self swab. April Zimmerman Rumple, CMA    Meds ordered this encounter  Medications  . metroNIDAZOLE (FLAGYL) 500 MG tablet    Sig: Take 1 tablet (500 mg total) by mouth 2 (two) times daily for 7 days.    Dispense:  14 tablet    Refill:  0  . fluconazole (DIFLUCAN) 150 MG tablet    Sig: Take 1 tablet (150 mg total) by mouth daily. Take second dose 72 hours later if symptoms still persists.    Dispense:  2 tablet    Refill:  0     Loni Muse, MD, PGY3 02/21/2019 2:03 PM

## 2019-02-20 NOTE — Patient Instructions (Signed)
We will treat for BV despite the negative test, sometimes the tests aren't perfect.   Please call [facility] [place] for free grief counseling. Call me if you can't get in touch with them. We are thinking about you in this hard time.

## 2019-02-21 ENCOUNTER — Telehealth: Payer: Self-pay

## 2019-02-21 DIAGNOSIS — Z634 Disappearance and death of family member: Secondary | ICD-10-CM | POA: Insufficient documentation

## 2019-02-21 LAB — LIPID PANEL
Chol/HDL Ratio: 4.2 ratio (ref 0.0–4.4)
Cholesterol, Total: 194 mg/dL (ref 100–199)
HDL: 46 mg/dL (ref >39)
LDL Calculated: 120 mg/dL — ABNORMAL HIGH (ref 0–99)
Triglycerides: 140 mg/dL (ref 0–149)
VLDL Cholesterol Cal: 28 mg/dL (ref 5–40)

## 2019-02-21 NOTE — Telephone Encounter (Signed)
-----   Message from Garth Bigness, MD sent at 02/21/2019  2:05 PM EDT ----- Please let patient know her cholesterol is ok. Her "bad" cholesterol is up just a little, but not concerning enough to need medicine. Walking and being active can help with this. She should call me with questions or if her chest heaviness that she was feeling changes at all. She should still see the cardiologist and they will call her w an appt.

## 2019-02-21 NOTE — Assessment & Plan Note (Signed)
Tragically, her mom died of what sounds like a sudden cardiac etiology last week. Pt is greiving and stressed. Intermittent chest heaviness, but sounds atypical as nonexertional and nonradiating. She will monitor the chest heaviness and call us back if worsening or changing. For grief, she will call [facility] for free grief counseling. As she now has a first degree relative with young sudden death, we will get lipids today and refer to cards. Previous EKG just 2 months ago was appropriate.

## 2019-02-21 NOTE — Telephone Encounter (Signed)
Called pt, no answer, no voicemail. If pt calls, please give her the information below. Sunday Spillers, CMA

## 2019-02-24 ENCOUNTER — Telehealth: Payer: Self-pay | Admitting: *Deleted

## 2019-02-24 NOTE — Telephone Encounter (Signed)
Pt informed of below.Nancy Young, CMA ? ?

## 2019-02-24 NOTE — Telephone Encounter (Signed)
-----   Message from Kathryn Timberlake, MD sent at 02/21/2019  2:05 PM EDT ----- Please let patient know her cholesterol is ok. Her "bad" cholesterol is up just a little, but not concerning enough to need medicine. Walking and being active can help with this. She should call me with questions or if her chest heaviness that she was feeling changes at all. She should still see the cardiologist and they will call her w an appt. 

## 2019-03-06 NOTE — Telephone Encounter (Signed)
Error

## 2019-04-15 ENCOUNTER — Telehealth: Payer: Self-pay | Admitting: *Deleted

## 2019-04-15 ENCOUNTER — Telehealth (INDEPENDENT_AMBULATORY_CARE_PROVIDER_SITE_OTHER): Payer: Medicaid Other | Admitting: Family Medicine

## 2019-04-15 ENCOUNTER — Other Ambulatory Visit: Payer: Self-pay

## 2019-04-15 DIAGNOSIS — J069 Acute upper respiratory infection, unspecified: Secondary | ICD-10-CM | POA: Diagnosis not present

## 2019-04-15 DIAGNOSIS — Z20822 Contact with and (suspected) exposure to covid-19: Secondary | ICD-10-CM

## 2019-04-15 DIAGNOSIS — B9789 Other viral agents as the cause of diseases classified elsewhere: Secondary | ICD-10-CM | POA: Diagnosis not present

## 2019-04-15 NOTE — Telephone Encounter (Signed)
Contacted pt to schedule testing; pt accepted appointment at [facility] site 04/16/2019 at 0930; pt given address, location, and instructions that she and all occupants of her vehicle should wear masks; she verbalized understanding; orders placed per protocol.

## 2019-04-15 NOTE — Progress Notes (Signed)
[facility] Telemedicine Visit  Patient consented to have virtual visit. Method of visit: Video  Encounter participants: Patient: Nancy Young - located at home Provider: Danna Hefty - located at home Others (if applicable): none  Chief Complaint: Feeling unwell  HPI: Patient notes she has had a cough, congestion, fatigue x days. She worked yesterday but was sent home because she didn't feel well.  Was told by her boss she was needed to be seen by a doctor. Denies any fevers. Checked her temp and it was 99 degrees. Cough is only occasionally productive with yellow phlegm. Does note some sweats yesterday. Denies any nausea, vomiting, constipation, diarrhea, SOB or difficulty breathing. Does endorse a sore throat. 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 She notes she does have exposure to patients that are positive for COVID. She notes they currently have 12 patients that are positive for COVID. She does note she is wearing the appropriate PPE. Denies any change in taste or smell. Decreased appetite, but does note she is drinking normally. Normal bowel movements and voiding. She notes two other co-workers are out sick today as well.   ROS: per HPI  Pertinent PMHx: None  Exam:  Respiratory: Breathing comfortably on room air, speaking in full sentences, occasional cough Gen: well appearing, NAD, sitting up comofrtably MSK: ambulating without diffuclty  Assessment/Plan:  Viral URI with cough Symptoms most consistent with viral URI. Less concern for pneumonia however still possible. Patient was well-appearing during video exam and has been afebrile which is reassuring. Some concern for COVID given symptoms and level of exposure in work place. Will send in referral for testing at [facility]. Return precautions discussed at length including but not limited to worsening SOB/difficulty breathing or new/persistent chest  pain. Recommended masking and self-quarantine until return of results. Patient understood and agreed to plan. Letter will be provided for patient to give to work place.   Time spent during visit with patient: 21 minutes  Mina Marble, [place], PGY2

## 2019-04-15 NOTE — Progress Notes (Signed)
Sent message to community pool. Deseree Kennon Holter, CMA

## 2019-04-15 NOTE — Assessment & Plan Note (Addendum)
Symptoms most consistent with viral URI. Less concern for pneumonia however still possible. Patient was well-appearing during video exam and has been afebrile which is reassuring. Some concern for COVID given symptoms and level of exposure in work place. Will send in referral for testing at [facility]. Return precautions discussed at length including but not limited to worsening SOB/difficulty breathing or new/persistent chest pain. Recommended masking and self-quarantine until return of results. Patient understood and agreed to plan. Letter will be provided for patient to give to work place.

## 2019-04-16 ENCOUNTER — Other Ambulatory Visit: Payer: Medicaid Other

## 2019-04-16 DIAGNOSIS — R6889 Other general symptoms and signs: Secondary | ICD-10-CM | POA: Diagnosis not present

## 2019-04-16 DIAGNOSIS — Z20822 Contact with and (suspected) exposure to covid-19: Secondary | ICD-10-CM

## 2019-04-18 DIAGNOSIS — J1289 Other viral pneumonia: Secondary | ICD-10-CM | POA: Diagnosis not present

## 2019-04-18 NOTE — Addendum Note (Signed)
Addended by: Danna Hefty on: 04/18/2019 10:58 AM   Modules accepted: Level of Service

## 2019-04-22 LAB — NOVEL CORONAVIRUS, NAA: SARS-CoV-2, NAA: NOT DETECTED

## 2019-05-19 ENCOUNTER — Ambulatory Visit: Payer: Medicaid Other | Admitting: Family Medicine

## 2019-05-19 ENCOUNTER — Other Ambulatory Visit: Payer: Self-pay

## 2019-05-19 VITALS — BP 110/80 | HR 87 | Wt 219.8 lb

## 2019-05-19 DIAGNOSIS — N898 Other specified noninflammatory disorders of vagina: Secondary | ICD-10-CM | POA: Diagnosis not present

## 2019-05-19 DIAGNOSIS — R350 Frequency of micturition: Secondary | ICD-10-CM | POA: Diagnosis not present

## 2019-05-19 LAB — POCT URINALYSIS DIP (MANUAL ENTRY)
Bilirubin, UA: NEGATIVE
Blood, UA: NEGATIVE
Glucose, UA: NEGATIVE mg/dL
Ketones, POC UA: NEGATIVE mg/dL
Leukocytes, UA: NEGATIVE
Nitrite, UA: NEGATIVE
Protein Ur, POC: NEGATIVE mg/dL
Spec Grav, UA: 1.025 (ref 1.010–1.025)
Urobilinogen, UA: 0.2 E.U./dL
pH, UA: 6.5 (ref 5.0–8.0)

## 2019-05-19 MED ORDER — FLUCONAZOLE 150 MG PO TABS: 150.0000 mg | ORAL_TABLET | Freq: Once | ORAL | 0 refills | Status: AC

## 2019-05-19 MED ORDER — METRONIDAZOLE 0.75 % VA GEL: 1.0000 | Freq: Two times a day (BID) | VAGINAL | 0 refills | Status: AC

## 2019-05-19 NOTE — Progress Notes (Signed)
     Subjective: Chief Complaint  Patient presents with  . Vaginal Discharge   HPI: Nancy Young is a 33 y.o. presenting to clinic today to discuss the following:  Vaginal Discharge with urinary frequency Patient states she has had BV many times before and this "feels like the same thing". She states she is having no vaginal pain, no rash, no itching, and no change in odor but has had cloudy different discharge as of late with an increase in urinary frequency. She endorses no dysuria. She has not had any unprotected sex or new sexual partners.  No fever, chills, abdominal pain, nausea, vomiting, or diarrhea.    ROS noted in HPI.   Past Medical, Surgical, Social, and Family History Reviewed & Updated per EMR.   Pertinent Historical Findings include:   Social History   Tobacco Use  Smoking Status Never Smoker  Smokeless Tobacco Never Used    Objective: BP 110/80   Pulse 87   Wt 219 lb 12.8 oz (99.7 kg)   SpO2 97%   BMI 38.94 kg/m  Vitals and nursing notes reviewed  Physical Exam Gen: Alert and Oriented x 3, NAD CV: RRR, no murmurs, normal S1, S2 split Resp: CTAB, no wheezing, rales, or rhonchi, comfortable work of breathing Abd: non-distended, non-tender, soft, +bs in all four quadrants GU: Deferred due to patient preference Ext: no clubbing, cyanosis, or edema Skin: warm, dry, intact, no rashes  Results for orders placed or performed in visit on 05/19/19 (from the past 72 hour(s))  POCT urinalysis dipstick     Status: None   Collection Time: 05/19/19  4:00 PM  Result Value Ref Range   Color, UA yellow yellow   Clarity, UA clear clear   Glucose, UA negative negative mg/dL   Bilirubin, UA negative negative   Ketones, POC UA negative negative mg/dL   Spec Grav, UA 1.025 1.010 - 1.025   Blood, UA negative negative   pH, UA 6.5 5.0 - 8.0   Protein Ur, POC negative negative mg/dL   Urobilinogen, UA 0.2 0.2 or 1.0 E.U./dL   Nitrite, UA Negative Negative    Leukocytes, UA Negative Negative    Assessment/Plan:  Vaginal discharge Given patient history of multiple episodes of BV and desire not to have pelvic exam today will treat for presumed BV. - Metronidazole 500mg  BID for 7 days and discussed with patient need to return to clinic if her symptoms do not improve with antibiotics - Diflucan 150mg  once as she frequently gets yeast infections after taking antibiotics   PATIENT EDUCATION PROVIDED: See AVS    Diagnosis and plan along with any newly prescribed medication(s) were discussed in detail with this patient today. The patient verbalized understanding and agreed with the plan. Patient advised if symptoms worsen return to clinic or ER.   Orders Placed This Encounter  Procedures  . POCT Wet Prep [facility])    Meds ordered this encounter  Medications  . fluconazole (DIFLUCAN) 150 MG tablet    Sig: Take 1 tablet (150 mg total) by mouth once for 1 dose.    Dispense:  1 tablet    Refill:  0  . metroNIDAZOLE (METROGEL VAGINAL) 0.75 % vaginal gel    Sig: Place 1 Applicatorful vaginally 2 (two) times daily for 5 days.    Dispense:  100 g    Refill:  0     Harolyn Rutherford, DO 05/19/2019, 2:56 PM PGY-3 [place]

## 2019-05-19 NOTE — Patient Instructions (Signed)
It was great to meet you today! Thank you for letting me participate in your care!  Today, we discussed your symptoms that are consistent with BV. I have prescribed Metrogel for you as well as a one time dose of Diflucan to treat a yeast infection after taking an antibiotic. If your symptoms fail to improve please return to the clinic.  Be well, Harolyn Rutherford, DO PGY-3, [facility]

## 2019-05-21 ENCOUNTER — Encounter: Payer: Self-pay | Admitting: Family Medicine

## 2019-05-21 ENCOUNTER — Other Ambulatory Visit (HOSPITAL_COMMUNITY)
Admission: RE | Admit: 2019-05-21 | Discharge: 2019-05-21 | Disposition: A | Discharge status: Home / Self Care | Payer: Medicaid Other | Source: Ambulatory Visit | Attending: Family Medicine | Admitting: Family Medicine

## 2019-05-21 ENCOUNTER — Ambulatory Visit: Payer: Medicaid Other | Admitting: Family Medicine

## 2019-05-21 ENCOUNTER — Other Ambulatory Visit: Payer: Self-pay

## 2019-05-21 VITALS — BP 118/60 | HR 104 | Ht 63.0 in | Wt 219.5 lb

## 2019-05-21 DIAGNOSIS — R350 Frequency of micturition: Secondary | ICD-10-CM | POA: Insufficient documentation

## 2019-05-21 MED ORDER — CEPHALEXIN 500 MG PO CAPS: 500.0000 mg | ORAL_CAPSULE | Freq: Two times a day (BID) | ORAL | 0 refills | Status: AC

## 2019-05-21 NOTE — Progress Notes (Signed)
   Subjective:    Patient ID: Nancy Young, female    DOB: May 24, 1986, 33 y.o.   MRN: [id number]   CC: urinary frequency   HPI: Urinary frequency  Patient today with complaints of urinary frequency.  States that she has to urinate every 20 to 30 minutes.  Was seen on 8/10 for both urinary frequency and vaginal discharge.  Says vaginal discharge has improved but continues to have urinary frequency.  Also describes urinary urgency and bladder pain prior to urination.  Denies any hematuria.  Denies any current vaginal discharge, has been taking metronidazole since 8/10.  Denies any urinary odor or color change.  Is sexually active with 1 female partner.  Denies any STD exposure.   Objective:  BP 118/60   Pulse (!) 104   Ht 5\' 3"  (1.6 m)   Wt 219 lb 8 oz (99.6 kg)   SpO2 98%   BMI 38.88 kg/m  Vitals and nursing note reviewed  General: well nourished, in no acute distress HEENT: normocephalic, no scleral icterus or conjunctival pallor  Cardiac: RRR, clear S1 and S2, no murmurs, rubs, or gallops Respiratory: clear to auscultation bilaterally, no increased work of breathing Abdomen: soft, nontender, nondistended, no masses or organomegaly. Bowel sounds present Extremities: no edema or cyanosis. Warm, well perfused.   Skin: warm and dry, no rashes noted Neuro: alert and oriented, no focal deficits Female genitalia: normal external genitalia, vulva, vagina, cervix, uterus and adnexa. Some thick white discharge noted (patient reports she just used metrogel)   Assessment & Plan:    Urinary frequency Consistent with urinary tract infection.  Dipstick negative on 8/10.  Will obtain cultures today.  We will plan to empirically treat with Keflex.  We will also obtain vaginal studies of GC/chlamydia, trichomonas, yeast infection.  We will also obtain urinary pregnancy test as this may be a cause of urinary frequency.  Strict return precautions given.  Follow-up if no improvement.   Follow-up sooner if symptoms of fever or back pain.    Return in about 2 weeks (around 06/04/2019), or if symptoms worsen or fail to improve.   Caroline More, DO, PGY-3

## 2019-05-21 NOTE — Patient Instructions (Signed)
It was a pleasure seeing you today.   Today we discussed your urine symptoms  For your symptoms: I have given you an antibiotic called keflex. Take this twice a day for 7 days. I will call with results, and since you gave me permission, I will leave a message.   Please follow up in 1 week if no improvement or sooner if symptoms persist or worsen. Please call the clinic immediately if you have any concerns.   Our clinic's number is [phone]. Please call with questions or concerns.   Thank you,  Caroline More, DO

## 2019-05-21 NOTE — Assessment & Plan Note (Signed)
Consistent with urinary tract infection.  Dipstick negative on 8/10.  Will obtain cultures today.  We will plan to empirically treat with Keflex.  We will also obtain vaginal studies of GC/chlamydia, trichomonas, yeast infection.  We will also obtain urinary pregnancy test as this may be a cause of urinary frequency.  Strict return precautions given.  Follow-up if no improvement.  Follow-up sooner if symptoms of fever or back pain.

## 2019-05-22 ENCOUNTER — Telehealth: Payer: Self-pay | Admitting: Family Medicine

## 2019-05-22 NOTE — Telephone Encounter (Signed)
Please inform patient that they have not resulted yet. I will call or send a mychart message once I have the results.  Dalphine Handing, PGY-3 [facility] 05/22/2019 12:05 PM

## 2019-05-22 NOTE — Telephone Encounter (Signed)
Called patient and informed her that test results have not been completed.  Patient states that she does not use MyChart much so she would prefer a call.  Ozella Almond, [place]

## 2019-05-22 NOTE — Assessment & Plan Note (Signed)
Given patient history of multiple episodes of BV and desire not to have pelvic exam today will treat for presumed BV. - Metronidazole 500mg  BID for 7 days and discussed with patient need to return to clinic if her symptoms do not improve with antibiotics - Diflucan 150mg  once as she frequently gets yeast infections after taking antibiotics

## 2019-05-22 NOTE — Telephone Encounter (Signed)
Pt is calling to check on the status of receiving her results from her last appointment.   Please call pt with results.

## 2019-05-23 ENCOUNTER — Telehealth: Payer: Self-pay | Admitting: *Deleted

## 2019-05-23 ENCOUNTER — Telehealth: Payer: Self-pay

## 2019-05-23 LAB — CERVICOVAGINAL ANCILLARY ONLY
Bacterial vaginitis: NEGATIVE
Candida vaginitis: NEGATIVE
Chlamydia: NEGATIVE
Neisseria Gonorrhea: NEGATIVE
Trichomonas: NEGATIVE

## 2019-05-23 LAB — URINE CULTURE

## 2019-05-23 NOTE — Telephone Encounter (Signed)
Called patient and informed of neg results.   Patient had concerns about continued urinary frequency.  Dr. Tammi Klippel was available to talk to patient.    Patient states that she had just started taking her antibiotics on 05/22/2019.  Dr. Tammi Klippel advised her to wait until she finish her course of antibiotics , to see if her symptoms improved.    If no improvement patient advised to make an appointment for further evaluation.  Ozella Almond, [place]

## 2019-05-23 NOTE — Telephone Encounter (Signed)
-----   Message from Caroline More, DO sent at 05/23/2019  3:56 PM EDT ----- Please inform patient that results of BV, candida, trich, chlamydia, and gonorrhea are negative.

## 2019-05-23 NOTE — Telephone Encounter (Signed)
Pt informed. Deseree Blount, CMA  

## 2019-07-11 ENCOUNTER — Ambulatory Visit: Payer: Medicaid Other | Admitting: Family Medicine

## 2019-07-12 ENCOUNTER — Encounter (HOSPITAL_COMMUNITY): Payer: Self-pay | Admitting: Emergency Medicine

## 2019-07-12 ENCOUNTER — Ambulatory Visit (HOSPITAL_COMMUNITY)
Admission: EM | Admit: 2019-07-12 | Discharge: 2019-07-12 | Disposition: A | Discharge status: Home / Self Care | Payer: Medicaid Other | Attending: Emergency Medicine | Admitting: Emergency Medicine

## 2019-07-12 ENCOUNTER — Encounter (HOSPITAL_COMMUNITY): Payer: Self-pay

## 2019-07-12 ENCOUNTER — Other Ambulatory Visit: Payer: Self-pay

## 2019-07-12 ENCOUNTER — Emergency Department (HOSPITAL_COMMUNITY)
Admission: EM | Admit: 2019-07-12 | Discharge: 2019-07-12 | Disposition: A | Discharge status: Home / Self Care | Payer: Medicaid Other | Attending: Emergency Medicine | Admitting: Emergency Medicine

## 2019-07-12 ENCOUNTER — Emergency Department (HOSPITAL_COMMUNITY): Payer: Medicaid Other

## 2019-07-12 DIAGNOSIS — Z5321 Procedure and treatment not carried out due to patient leaving prior to being seen by health care provider: Secondary | ICD-10-CM | POA: Insufficient documentation

## 2019-07-12 DIAGNOSIS — S99922A Unspecified injury of left foot, initial encounter: Secondary | ICD-10-CM | POA: Diagnosis not present

## 2019-07-12 DIAGNOSIS — M79673 Pain in unspecified foot: Secondary | ICD-10-CM | POA: Diagnosis not present

## 2019-07-12 DIAGNOSIS — S93602A Unspecified sprain of left foot, initial encounter: Secondary | ICD-10-CM

## 2019-07-12 DIAGNOSIS — M79672 Pain in left foot: Secondary | ICD-10-CM | POA: Diagnosis not present

## 2019-07-12 MED ORDER — HYDROCODONE-ACETAMINOPHEN 5-325 MG PO TABS: 1.0000 | ORAL_TABLET | Freq: Four times a day (QID) | ORAL | 0 refills | Status: DC | PRN

## 2019-07-12 MED ORDER — IBUPROFEN 600 MG PO TABS: 600.0000 mg | ORAL_TABLET | Freq: Four times a day (QID) | ORAL | 0 refills | Status: DC | PRN

## 2019-07-12 NOTE — ED Provider Notes (Signed)
HPI  SUBJECTIVE:  Nancy Young is a 33 y.o. female who presents with left medial foot pain described as constant, throbbing starting yesterday after having a slip and fall down the stairs.  She is not sure how she landed on her foot.  She states the pain is located along her first toe to the middle of her foot.  She reports swelling and mild tingling.  No bruising, erythema, numbness.  No ankle injury.  She went to the [facility] long ED yesterday, had an x-ray which was read as negative.  She left prior to being seen.  She tried ice, Advil 600 mg, elevation without improvement in her symptoms.  Symptoms are worse with walking, movement.  Past medical history negative for diabetes, hypertension, left foot injury.  LMP: 9/10.  Denies the possibility of being pregnant.  PMD: Lattie Haw, MD   Past Medical History:  Diagnosis Date  . Obesity   . Vaginal discharge 07/27/2017    Past Surgical History:  Procedure Laterality Date  . LEEP  2013    Family History  Problem Relation Age of Onset  . Diabetes Mother   . Hypertension Mother   . Hyperlipidemia Mother   . Bipolar disorder Mother   . Diabetes Maternal Grandmother   . Sickle cell anemia Maternal Grandmother   . Heart attack Maternal Grandmother   . Anxiety disorder Maternal Grandmother   . Hypertension Maternal Grandmother   . Hyperlipidemia Maternal Grandmother   . Stroke Maternal Grandmother   . Hepatitis Maternal Grandfather   . Lung disease Maternal Grandfather   . Diabetes Paternal Grandmother   . Hypertension Paternal Grandmother   . Hyperlipidemia Paternal Grandmother   . Kidney disease Maternal Aunt   . Diabetes Maternal Aunt   . Hypertension Maternal Aunt   . Stomach cancer Other     Social History   Tobacco Use  . Smoking status: Never Smoker  . Smokeless tobacco: Never Used  Substance Use Topics  . Alcohol use: Yes    Comment: 1-2 drinks per 2-3 times/week  . Drug use: No    No current  facility-administered medications for this encounter.   Current Outpatient Medications:  .  acetaminophen (TYLENOL) 500 MG tablet, Take 1,000 mg by mouth every 6 (six) hours as needed for mild pain or headache., Disp: , Rfl:   No Known Allergies   ROS  As noted in HPI.   Physical Exam  BP (!) 145/94 (BP Location: Right Arm)   Pulse 87   Temp 97.9 F (36.6 C) (Oral)   Resp 18   LMP 06/19/2019   SpO2 98%   Constitutional: Well developed, well nourished, no acute distress Eyes:  EOMI, conjunctiva normal bilaterally HENT: Normocephalic, atraumatic,mucus membranes moist Respiratory: Normal inspiratory effort Cardiovascular: Normal rate GI: nondistended skin: No rash, skin intact Musculoskeletal: no deformities.  Normal appearance left foot.  Positive tenderness at the proximal phalanx of the left first toe, MTP joint, first metatarsal.  No crepitus, bruising.  No appreciable swelling.  Sensation intact over the entire foot.  Cap refill less than 2 seconds.  No other tenderness over the ankle or the rest of the foot. Neurologic: Alert & oriented x 3, no focal neuro deficits Psychiatric: Speech and behavior appropriate   ED Course   Medications - No data to display  Orders Placed This Encounter  Procedures  . Post op shoe    Standing Status:   Standing    Number of Occurrences:   1  Order Specific Question:   Laterality    Answer:   Left    No results found for this or any previous visit (from the past 24 hour(s)). Dg Foot Complete Left  Result Date: 07/12/2019 CLINICAL DATA:  Left foot pain post fall EXAM: LEFT FOOT - COMPLETE 3+ VIEW COMPARISON:  None. FINDINGS: There is no evidence of fracture or dislocation. There is no evidence of arthropathy or other focal bone abnormality. Soft tissues are unremarkable. IMPRESSION: Negative. Electronically Signed   By: Jonna Clark M.D.   On: 07/12/2019 02:20    ED Clinical Impression  1. Sprain of left foot, initial  encounter      ED Assessment/Plan  Patient with a foot sprain.  X-rays negative for fracture.  [place] Narcotic database reviewed for this patient, and feel that the risk/benefit ratio today is favorable for proceeding with a prescription for controlled substance.  Last opiate prescription last year.  Reviewed imaging independently.  No fracture.  See radiology report for full details.  Placing in postop shoe, Tylenol/ibuprofen combination 3-4 times a day, ibuprofen/Norco for severe pain.  Follow-up with [facility] if not better in 2 to 4 weeks.  Discussed  imaging, MDM, treatment plan, and plan for follow-up with patient. . patient agrees with plan.   No orders of the defined types were placed in this encounter.   *This clinic note was created using Dragon dictation software. Therefore, there may be occasional mistakes despite careful proofreading.   ?    Domenick Gong, MD 07/12/19 1113

## 2019-07-12 NOTE — ED Notes (Signed)
Pt gave her stickers to registration and stated that she was leaving d/t wait.

## 2019-07-12 NOTE — ED Triage Notes (Signed)
Pt here for left foot pain after tripping going down stairs yesterday

## 2019-07-12 NOTE — ED Triage Notes (Signed)
Pt reports sliding down 14 steps. States that she was holding on to the rail, but her L foot got twisted. Pt is ambulatory. Denies head injury or LOC. Denies pain anywhere else. A&Ox4. Happened around 830p.

## 2019-07-12 NOTE — Discharge Instructions (Addendum)
Take ibuprofen with a Tylenol containing product 3 or 4 times a day as needed for pain.  Either the ibuprofen with 1 g of Tylenol for mild to moderate pain or the ibuprofen with 1-2 Norco for severe pain.  Continue icing and elevating your foot.  Follow-up with [facility] if you are not better in 2 to 4 weeks.

## 2019-07-14 ENCOUNTER — Other Ambulatory Visit: Payer: Self-pay

## 2019-07-14 ENCOUNTER — Ambulatory Visit (INDEPENDENT_AMBULATORY_CARE_PROVIDER_SITE_OTHER): Payer: Medicaid Other | Admitting: Family Medicine

## 2019-07-14 ENCOUNTER — Other Ambulatory Visit (HOSPITAL_COMMUNITY)
Admission: RE | Admit: 2019-07-14 | Discharge: 2019-07-14 | Disposition: A | Discharge status: Home / Self Care | Payer: Medicaid Other | Source: Ambulatory Visit | Attending: Family Medicine | Admitting: Family Medicine

## 2019-07-14 VITALS — BP 115/80 | HR 75 | Temp 98.6°F | Wt 221.2 lb

## 2019-07-14 DIAGNOSIS — N898 Other specified noninflammatory disorders of vagina: Secondary | ICD-10-CM

## 2019-07-14 DIAGNOSIS — S96912D Strain of unspecified muscle and tendon at ankle and foot level, left foot, subsequent encounter: Secondary | ICD-10-CM

## 2019-07-14 DIAGNOSIS — Z0000 Encounter for general adult medical examination without abnormal findings: Secondary | ICD-10-CM

## 2019-07-14 DIAGNOSIS — Z23 Encounter for immunization: Secondary | ICD-10-CM

## 2019-07-14 LAB — POCT WET PREP (WET MOUNT)
Clue Cells Wet Prep Whiff POC: NEGATIVE
Trichomonas Wet Prep HPF POC: ABSENT

## 2019-07-14 NOTE — Patient Instructions (Addendum)
It was great meeting you today!  Your wet prep was negative for any abnormality.  We did see some bacteria but that is normal.  I will give you a call when the gonorrhea and chlamydia comes back.  In terms of your foot problem think you have the right treatment in place.  Unfortunately can take up to a week for the pain to improve.  If you are still having problems in 7 to 10 days I think that would be a good idea to go ahead and reimage that area to make sure he did not have a fracture missed by the initial x-ray.  Continue taking your ibuprofen and Tylenol as prescribed.  [place] 3.5   [phone])  Medical supply store [address] Open ? Closes 6PM  [phone]  [place] 4.6   (45)  Medical supply store [place] [address] Open ? Closes 5:30PM  [phone]  [place] 2.0   (3)  Medical supply store [address] Open ? Closes 6PM  [phone]  [place] No reviews  Medical supply store [place]  [facility] Open now  [phone]  [facility] 4.0   (2)  Medical supply store [address] [phone]  [place] No reviews  Medical supply store [address] Closes soon ? 5PM  [phone]  [place] 2.7   (28)  Medical supply store [address] Closes soon ? [place]  [phone]

## 2019-07-17 ENCOUNTER — Encounter: Payer: Self-pay | Admitting: Family Medicine

## 2019-07-17 ENCOUNTER — Telehealth: Payer: Self-pay | Admitting: Family Medicine

## 2019-07-17 DIAGNOSIS — S96912A Strain of unspecified muscle and tendon at ankle and foot level, left foot, initial encounter: Secondary | ICD-10-CM | POA: Insufficient documentation

## 2019-07-17 LAB — CERVICOVAGINAL ANCILLARY ONLY
Chlamydia: NEGATIVE
Neisseria Gonorrhea: NEGATIVE

## 2019-07-17 NOTE — Assessment & Plan Note (Signed)
Patient with likely soft tissue trauma and possible bone bruising from her misstep.  Initial x-rays were negative.  Gave patient prescription for postop shoe to see if there is sized open or better at medical supply store.  Can continue taking the Tylenol, Advil, Norco as prescribed by the urgent care.  Patient to follow-up in 7 to 10 days if not any better.  Would likely get repeat x-rays at that time to see if there is a small occult fracture which was missed on interval imaging.

## 2019-07-17 NOTE — Progress Notes (Signed)
   HPI 33 year old female who presents for what "feels like a bacterial infection".  Patient states that she has frequently had "bacterial infections" of her vagina and feels like this is happening now.  She has had a clear discharge for the last couple of days.  No malodorous discharge noted.  She has been sexually active with one partner and use a condom, but is requesting a gonorrhea/chlamydia test as well today.  Patient is also presenting for follow-up of a sprained left foot.  She was going downstairs and forcibly everted her ankle on the last step.  She was seen in urgent care and had a foot x-ray performed which was negative for fractures.  She was diagnosed with a foot sprain and given a postop shoe.  She was told to take Tylenol ibuprofen, with Norco for breakthrough.  She states that the pain is worse when she is standing.  She is afraid the postop shoe is too big and it is causing her to "run her foot into the stuff."  Patient requesting flu vaccine  CC: Foot pain follow-up, STD screen   ROS:   Review of Systems See HPI for ROS.   CC, SH/smoking status, and VS noted  Objective: BP 115/80   Pulse 75   Temp 98.6 F (37 C)   Wt 221 lb 3.2 oz (100.3 kg)   LMP 06/19/2019 (Exact Date)   SpO2 100%   BMI 39.18 kg/m  Gen: 33 year old African-American female, no acute distress, very pleasant CV: RRR, no murmur Resp: CTAB, no wheezes, non-labored Neuro: Alert and oriented, Speech clear, No gross deficits Left foot: Very minimal swelling noted around the medial midfoot.  Along the length of the great toe.  Range of motion fully intact.  Patient is able to ambulate with a postop shoe in place.  Plantar/ dorsiflexion strength fully intact. GU: Cervix easily visualized on speculum exam.  Minimal clear discharge noted.  Wet prep performed, showed many bacteria, but otherwise normal  Assessment and plan:  Vaginal discharge Wet prep performed which showed some bacteria but was  otherwise normal.  After this patient states that she can occasionally get similar symptoms when she starts her menstrual cycle believe this might be the etiology of her discharge.  GC/chlamydia still pending, will update if positive.  Strain of left foot Patient with likely soft tissue trauma and possible bone bruising from her misstep.  Initial x-rays were negative.  Gave patient prescription for postop shoe to see if there is sized open or better at medical supply store.  Can continue taking the Tylenol, Advil, Norco as prescribed by the urgent care.  Patient to follow-up in 7 to 10 days if not any better.  Would likely get repeat x-rays at that time to see if there is a small occult fracture which was missed on interval imaging.  Health care maintenance Flu vaccine given today   Orders Placed This Encounter  Procedures  . Flu Vaccine QUAD 36+ mos IM  . POCT Wet Prep [facility])    No orders of the defined types were placed in this encounter.    Guadalupe Dawn MD PGY-3 Family Medicine Resident  07/17/2019 11:17 AM

## 2019-07-17 NOTE — Assessment & Plan Note (Signed)
Flu vaccine given today. 

## 2019-07-17 NOTE — Assessment & Plan Note (Signed)
Wet prep performed which showed some bacteria but was otherwise normal.  After this patient states that she can occasionally get similar symptoms when she starts her menstrual cycle believe this might be the etiology of her discharge.  GC/chlamydia still pending, will update if positive.

## 2019-07-17 NOTE — Telephone Encounter (Signed)
Called patient today.  Patient right foot last week and has been reporting pain since then stop saw Dr. Kris Mouton in clinic area who recommended supportive therapy.  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  Was at work yesterday and foot pain increased after long shift.  I have left a letter for work at the reception she will collect this tomorrow. Advised patient to return to care if pain continues beyond 4 more days.   Dr. Posey Pronto

## 2019-07-17 NOTE — Telephone Encounter (Signed)
The patient still has a lot of pain from her foot and would like an out of work note for through today and however many days you think she may need to be out of work.  Please call with questions to [phone].

## 2019-08-11 ENCOUNTER — Ambulatory Visit: Payer: Medicaid Other | Admitting: Family Medicine

## 2019-08-13 ENCOUNTER — Encounter: Payer: Self-pay | Admitting: Family Medicine

## 2019-08-13 ENCOUNTER — Ambulatory Visit (INDEPENDENT_AMBULATORY_CARE_PROVIDER_SITE_OTHER): Payer: Medicaid Other | Admitting: Family Medicine

## 2019-08-13 ENCOUNTER — Other Ambulatory Visit: Payer: Self-pay

## 2019-08-13 VITALS — BP 126/72 | HR 73 | Wt 220.4 lb

## 2019-08-13 DIAGNOSIS — N898 Other specified noninflammatory disorders of vagina: Secondary | ICD-10-CM | POA: Diagnosis not present

## 2019-08-13 LAB — POCT WET PREP (WET MOUNT)
Clue Cells Wet Prep Whiff POC: NEGATIVE
Trichomonas Wet Prep HPF POC: ABSENT

## 2019-08-13 NOTE — Assessment & Plan Note (Addendum)
Unsure of cause.  No yeast or bv seen but could be obscured by menstrual period blood.  Will monitor for recurrence.

## 2019-08-13 NOTE — Patient Instructions (Signed)
Good to see you today!  Thanks for coming in.  If the discharge of white or if itchy then call us   If want to investigate the irregular menstrual periods then make an appointment with Dr Posey Pronto  Keep a diary of your menstrual periods

## 2019-08-13 NOTE — Progress Notes (Signed)
Subjective  Nancy Young is a 33 y.o. female is presenting with the following  VAGINAL DISCHARGE  Having vaginal discharge for 2-3 days. Started just before her current menstrual period which are irregular Female sex partner.  No risk for pregnancy does not need contraception  Discharge consistency: was white and thick Discharge color: white Medications tried: none  Recent antibiotic use: no Possible STD exposure:no  Symptoms Fever: no Dysuria:no Vaginal bleeding: yes with menstrual period Abdomen or Pelvic pain: no Back pain: no Genital sores or ulcers:no Rash: no Pain during sex: no Missed menstrual period: has irregular menstrual periods for many years  ROS see HPI Smoking Status noted   Chief Complaint noted Review of Symptoms - see HPI PMH - Smoking status noted.    Objective Vital Signs reviewed BP 126/72   Pulse 73   Wt 220 lb 6.4 oz (100 kg)   SpO2 99%   BMI 39.04 kg/m  Genitalia:  Normal introitus for age, no external lesions, no visible vaginal discharge, mucosa pink and moist, blood from os and in vault  Assessments/Plans  Vaginal discharge Unsure of cause.  No yeast or bv seen but could be obscured by menstrual period blood.  Will monitor for recurrence.     See after visit summary for details of patient instructions

## 2019-08-25 ENCOUNTER — Telehealth: Payer: Self-pay | Admitting: *Deleted

## 2019-08-25 ENCOUNTER — Encounter: Payer: Self-pay | Admitting: Family Medicine

## 2019-08-25 ENCOUNTER — Other Ambulatory Visit: Payer: Self-pay | Admitting: Family Medicine

## 2019-08-25 MED ORDER — TERCONAZOLE 0.8 % VA CREA: 1.0000 | TOPICAL_CREAM | Freq: Every day | VAGINAL | 0 refills | Status: DC

## 2019-08-25 NOTE — Telephone Encounter (Signed)
Pt lm on nurse line, asking for a return call.  Attempted to call, no answer and no machine. Christen Bame, CMA

## 2019-08-25 NOTE — Telephone Encounter (Signed)
Pt calls back she was told to call back if she was still having vaginal discharge issues after her period.  She endorses white clumpy discharge (enough she must ear a panty liner) and vaginal itching.  She is requesting a script for cream (pill dose not work) for the yeast and also for BV as she "normally gets these together".  To Dr.Chambliss.  Christen Bame, CMA

## 2019-08-25 NOTE — Telephone Encounter (Signed)
See My Chart message I sent

## 2019-09-01 DIAGNOSIS — H5213 Myopia, bilateral: Secondary | ICD-10-CM | POA: Diagnosis not present

## 2019-09-08 DIAGNOSIS — H5213 Myopia, bilateral: Secondary | ICD-10-CM | POA: Diagnosis not present

## 2019-09-23 ENCOUNTER — Other Ambulatory Visit: Payer: Self-pay

## 2019-09-23 ENCOUNTER — Encounter: Payer: Self-pay | Admitting: Family Medicine

## 2019-09-23 ENCOUNTER — Ambulatory Visit (INDEPENDENT_AMBULATORY_CARE_PROVIDER_SITE_OTHER): Payer: Medicaid Other | Admitting: Family Medicine

## 2019-09-23 VITALS — BP 122/80 | HR 68 | Wt 218.0 lb

## 2019-09-23 DIAGNOSIS — Z6836 Body mass index (BMI) 36.0-36.9, adult: Secondary | ICD-10-CM | POA: Diagnosis not present

## 2019-09-23 DIAGNOSIS — G8929 Other chronic pain: Secondary | ICD-10-CM | POA: Diagnosis not present

## 2019-09-23 DIAGNOSIS — E669 Obesity, unspecified: Secondary | ICD-10-CM

## 2019-09-23 DIAGNOSIS — M25561 Pain in right knee: Secondary | ICD-10-CM | POA: Insufficient documentation

## 2019-09-23 DIAGNOSIS — M25562 Pain in left knee: Secondary | ICD-10-CM

## 2019-09-23 MED ORDER — NAPROXEN 500 MG PO TABS: 500.0000 mg | ORAL_TABLET | Freq: Two times a day (BID) | ORAL | 0 refills | Status: DC

## 2019-09-23 NOTE — Assessment & Plan Note (Signed)
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 Counseled on weight loss through diet and exercise, see AVS for details.

## 2019-09-23 NOTE — Patient Instructions (Signed)
It was great to see you!  Our plans for today:  - We are getting standing xrays of your knees to better assess your joint space. See attached map for directions. We will call you with the results of your xray. - Take the naproxen twice daily for the next 10-15 days and come back if you are still having pain. - See below for tips on losing weight. Low impact exercise such as swimming or bicycling can be helpful in losing weight but won't put a lot of pressure on your joints.  Take care and seek immediate care sooner if you develop any concerns.   Dr. [facility]  Here is an example of what a healthy plate looks like:    ? Make half your plate fruits and vegetables.     ? Focus on whole fruits.     ? Vary your veggies.  ? Make half your grains whole grains. -     ? Look for the word "whole" at the beginning of the ingredients list    ? Some whole-grain ingredients include whole oats, whole-wheat flour,        whole-grain corn, whole-grain brown rice, and whole rye.  ? Move to low-fat and fat-free milk or yogurt.  ? Vary your protein routine. - Meat, fish, poultry (chicken, [place]), eggs, beans (kidney, pinto), dairy.  ? Drink and eat less sodium, saturated fat, and added sugars.

## 2019-09-23 NOTE — Assessment & Plan Note (Signed)
Chronic and worsened. Suspect 2/2 OA. Has never had standing AP xrays, will obtain to better assess joint space. No findings on history or exam to concern for trauma, malignancy, DVT, fracture. Patient interested in future steroid injection but given she has not yet tried NSAIDs, will trial this first. If pain not improved in a few weeks, may return to clinic for steroid injection. Also counseled on weight loss and low impact exercises.

## 2019-09-23 NOTE — Progress Notes (Signed)
  Subjective:   Patient ID: Nancy Young    DOB: [birth year], 33 y.o. female   MRN: [id number]  Nancy Young is a 33 y.o. female with a history of obesity, FH of early cardiac disease here for   Knee Pain - h/o chronic L knee pain and swelling. Knee XR 08/2018 negative, 02/2018 with minimal degenerative changes though not standing. - thinks she may have arthritis - is trying to lose weight  Pain is: throbbing, mostly with walking. Difficult to drive or stand for long period of time Location: middle side of knee Pain started: ongoing, worsened 3 weeks ago Medications tried: tylenol pm. Also tried ice packs, elevation. Recent trauma: no Similar pain previously: yes  Symptoms Redness:no Swelling:yes, L Fever: no Weakness: no Weight loss: no Rash: no  Review of Systems:  Per HPI.  Medications and smoking status reviewed.  Objective:   BP 122/80   Pulse 68   Wt 218 lb (98.9 kg)   LMP 09/09/2019 (Approximate)   SpO2 98%   BMI 38.62 kg/m  Vitals and nursing note reviewed.  General: obese female, in no acute distress with non-toxic appearance Skin: warm, dry, no rashes or lesions Extremities: warm and well perfused, normal tone MSK: ROM grossly intact, gait normal. Bilateral knees without swelling, redness. No joint laxity appreciated on exam. Some tenderness to palpation of bilateral medical joint lines.  Neuro: Alert and oriented, speech normal  Assessment & Plan:   Bilateral knee pain Chronic and worsened. Suspect 2/2 OA. Has never had standing AP xrays, will obtain to better assess joint space. No findings on history or exam to concern for trauma, malignancy, DVT, fracture. Patient interested in future steroid injection but given she has not yet tried NSAIDs, will trial this first. If pain not improved in a few weeks, may return to clinic for steroid injection. Also counseled on weight loss and low impact exercises.   Obesity Contributing to bilateral  knee OA. Counseled on weight loss through diet and exercise, see AVS for details.   Orders Placed This Encounter  Procedures  . DG Knee Bilateral Standing AP    Standing Status:   Future    Standing Expiration Date:   11/23/2020    Order Specific Question:   Reason for Exam (SYMPTOM  OR DIAGNOSIS REQUIRED)    Answer:   bilateral knee pain, arthritis suspected    Order Specific Question:   Is patient pregnant?    Answer:   No    Order Specific Question:   Preferred imaging location?    Answer:   [facility]    Order Specific Question:   Radiology Contrast Protocol - do NOT remove file path    Answer:   [email or web address]   Meds ordered this encounter  Medications  . naproxen (NAPROSYN) 500 MG tablet    Sig: Take 1 tablet (500 mg total) by mouth 2 (two) times daily with a meal.    Dispense:  30 tablet    Refill:  0    Rory Percy, DO PGY-3, [facility] 09/23/2019 9:45 AM

## 2019-10-19 ENCOUNTER — Ambulatory Visit (HOSPITAL_COMMUNITY)
Admission: EM | Admit: 2019-10-19 | Discharge: 2019-10-19 | Disposition: A | Discharge status: Home / Self Care | Payer: Medicaid Other | Attending: Family Medicine | Admitting: Family Medicine

## 2019-10-19 ENCOUNTER — Encounter (HOSPITAL_COMMUNITY): Payer: Self-pay | Admitting: *Deleted

## 2019-10-19 ENCOUNTER — Other Ambulatory Visit: Payer: Self-pay

## 2019-10-19 DIAGNOSIS — N760 Acute vaginitis: Secondary | ICD-10-CM | POA: Diagnosis not present

## 2019-10-19 DIAGNOSIS — B9689 Other specified bacterial agents as the cause of diseases classified elsewhere: Secondary | ICD-10-CM

## 2019-10-19 HISTORY — DX: Candidiasis, unspecified: B37.9

## 2019-10-19 HISTORY — DX: Other specified bacterial agents as the cause of diseases classified elsewhere: B96.89

## 2019-10-19 HISTORY — DX: Other specified bacterial agents as the cause of diseases classified elsewhere: N76.0

## 2019-10-19 MED ORDER — FLUCONAZOLE 200 MG PO TABS: 200.0000 mg | ORAL_TABLET | Freq: Every day | ORAL | 0 refills | Status: AC

## 2019-10-19 MED ORDER — METRONIDAZOLE-CLEANSER 0.75 % CREAM EX KIT: 1.0000 | PACK | Freq: Two times a day (BID) | CUTANEOUS | 1 refills | Status: DC

## 2019-10-19 NOTE — ED Triage Notes (Signed)
C/O vaginal discharge x approx 2 wks.  States gets frequent BV infections.  Denies fevers.  C/O slight abd pain.

## 2019-10-19 NOTE — ED Provider Notes (Signed)
Sajad Glander    CSN: [id number] Arrival date & time: 10/19/19  1001      History   Chief Complaint Chief Complaint  Patient presents with  . Vaginal Discharge    HPI Nancy Young is a 34 y.o. female.   Patient is having discharge without itching.  She has a history of BV infections.  Has had same sexual partner, same sex.  No chance of pregnancy.  Denies symptoms of urinary infection such as dysuria or frequency  HPI  Past Medical History:  Diagnosis Date  . BV (bacterial vaginosis)   . Obesity   . Vaginal discharge 07/27/2017  . Yeast infection     Patient Active Problem List   Diagnosis Date Noted  . Bilateral knee pain 09/23/2019  . Strain of left foot 07/17/2019  . Urinary frequency 2019-05-30  . Sudden death of family member Mar 02, 2019  . Carpal tunnel syndrome 12/31/2018  . Vaginal discharge 05/14/2018  . Health care maintenance 07/02/2017  . Obesity 05/22/2017  . STD exposure 05/22/2017    Past Surgical History:  Procedure Laterality Date  . LEEP  2013    OB History   No obstetric history on file.      Home Medications    Prior to Admission medications   Medication Sig Start Date End Date Taking? Authorizing Provider  acetaminophen (TYLENOL) 500 MG tablet Take 1,000 mg by mouth every 6 (six) hours as needed for mild pain or headache.    [provider]  fluconazole (DIFLUCAN) 200 MG tablet Take 1 tablet (200 mg total) by mouth daily for 7 days. 10/19/19 10/26/19  Wardell Honour, MD  HYDROcodone-acetaminophen (NORCO/VICODIN) 5-325 MG tablet Take 1-2 tablets by mouth every 6 (six) hours as needed for moderate pain or severe pain. 07/12/19   Melynda Ripple, MD  ibuprofen (ADVIL) 600 MG tablet Take 1 tablet (600 mg total) by mouth every 6 (six) hours as needed. 07/12/19   Melynda Ripple, MD  naproxen (NAPROSYN) 500 MG tablet Take 1 tablet (500 mg total) by mouth 2 (two) times daily with a meal. 09/23/19   Rory Percy, DO  terconazole (TERAZOL 3) 0.8 % vaginal cream Place 1 applicator vaginally at bedtime. 08/25/19   Lind Covert, MD    Family History Family History  Problem Relation Age of Onset  . Diabetes Mother   . Hypertension Mother   . Hyperlipidemia Mother   . Bipolar disorder Mother   . Diabetes Maternal Grandmother   . Sickle cell anemia Maternal Grandmother   . Heart attack Maternal Grandmother   . Anxiety disorder Maternal Grandmother   . Hypertension Maternal Grandmother   . Hyperlipidemia Maternal Grandmother   . Stroke Maternal Grandmother   . Hepatitis Maternal Grandfather   . Lung disease Maternal Grandfather   . Diabetes Paternal Grandmother   . Hypertension Paternal Grandmother   . Hyperlipidemia Paternal Grandmother   . Kidney disease Maternal Aunt   . Diabetes Maternal Aunt   . Hypertension Maternal Aunt   . Stomach cancer Other     Social History Social History   Tobacco Use  . Smoking status: Never Smoker  . Smokeless tobacco: Never Used  Substance Use Topics  . Alcohol use: Yes    Comment: 1-2 drinks per 2-3 times/week  . Drug use: No     Allergies   Patient has no known allergies.   Review of Systems Review of Systems  Genitourinary: Positive for vaginal discharge.  All other systems  reviewed and are negative.    Physical Exam Triage Vital Signs ED Triage Vitals [10/19/19 1018]  Enc Vitals Group     BP 112/77     Pulse Rate 80     Resp 16     Temp 97.9 F (36.6 C)     Temp Source Oral     SpO2 100 %     Weight      Height      Head Circumference      Peak Flow      Pain Score 0     Pain Loc      Pain Edu?      Excl. in GC?    No data found.  Updated Vital Signs BP 112/77   Pulse 80   Temp 97.9 F (36.6 C) (Oral)   Resp 16   LMP 10/09/2019 (Exact Date)   SpO2 100%   Visual Acuity Right Eye Distance:   Left Eye Distance:   Bilateral Distance:    Right Eye Near:   Left Eye Near:    Bilateral Near:      Physical Exam Vitals and nursing note reviewed.  Constitutional:      Appearance: Normal appearance. She is obese.  Cardiovascular:     Rate and Rhythm: Normal rate and regular rhythm.  Pulmonary:     Effort: Pulmonary effort is normal.     Breath sounds: Normal breath sounds.  Abdominal:     General: Abdomen is flat.     Palpations: Abdomen is soft.     Tenderness: There is no abdominal tenderness. There is no guarding.  Genitourinary:    Comments: Patient declined exam Neurological:     Mental Status: She is alert.      UC Treatments / Results  Labs (all labs ordered are listed, but only abnormal results are displayed) Labs Reviewed - No data to display  EKG   Radiology No results found.  Procedures Procedures (including critical care time)  Medications Ordered in UC Medications - No data to display  Initial Impression / Assessment and Plan / UC Course  I have reviewed the triage vital signs and the nursing notes.  Pertinent labs & imaging results that were available during my care of the patient were reviewed by me and considered in my medical decision making (see chart for details).     Vaginitis, based on history of probable BV Final Clinical Impressions(s) / UC Diagnoses   Final diagnoses:  BV (bacterial vaginosis)   Discharge Instructions   None    ED Prescriptions    Medication Sig Dispense Auth. Provider   fluconazole (DIFLUCAN) 200 MG tablet Take 1 tablet (200 mg total) by mouth daily for 7 days. 7 tablet Frederica Kuster, MD     PDMP not reviewed this encounter.   Frederica Kuster, MD 10/19/19 1039

## 2019-11-05 ENCOUNTER — Ambulatory Visit (INDEPENDENT_AMBULATORY_CARE_PROVIDER_SITE_OTHER): Payer: Medicaid Other

## 2019-11-05 ENCOUNTER — Other Ambulatory Visit: Payer: Self-pay

## 2019-11-05 ENCOUNTER — Ambulatory Visit: Payer: Medicaid Other | Admitting: Family Medicine

## 2019-11-05 DIAGNOSIS — Z111 Encounter for screening for respiratory tuberculosis: Secondary | ICD-10-CM

## 2019-11-05 NOTE — Progress Notes (Signed)
Patient is here for ppd placement.  It was placed on 11/05/2019 in the right forearm @ 1045 am.  *Left forearm has scarring.   Wheel noted.  Scheduled f/u appointment for Friday at 2:30 p.m.    Veronda Prude, RN

## 2019-11-07 ENCOUNTER — Encounter: Payer: Self-pay | Admitting: Family Medicine

## 2019-11-07 ENCOUNTER — Other Ambulatory Visit (HOSPITAL_COMMUNITY)
Admission: RE | Admit: 2019-11-07 | Discharge: 2019-11-07 | Disposition: A | Discharge status: Home / Self Care | Payer: Medicaid Other | Source: Ambulatory Visit | Attending: Family Medicine | Admitting: Family Medicine

## 2019-11-07 ENCOUNTER — Ambulatory Visit (INDEPENDENT_AMBULATORY_CARE_PROVIDER_SITE_OTHER): Payer: Medicaid Other | Admitting: Family Medicine

## 2019-11-07 ENCOUNTER — Other Ambulatory Visit: Payer: Self-pay

## 2019-11-07 VITALS — BP 120/84 | HR 84 | Wt 218.6 lb

## 2019-11-07 DIAGNOSIS — N898 Other specified noninflammatory disorders of vagina: Secondary | ICD-10-CM

## 2019-11-07 LAB — POCT WET PREP (WET MOUNT)
Clue Cells Wet Prep Whiff POC: NEGATIVE
Trichomonas Wet Prep HPF POC: ABSENT

## 2019-11-07 LAB — TB SKIN TEST
Induration: 0 mm
TB Skin Test: NEGATIVE

## 2019-11-07 NOTE — Patient Instructions (Signed)
I will call you with results.  Thank you!!

## 2019-11-07 NOTE — Progress Notes (Addendum)
  Patient Name: Nancy Young Date of Birth: 1986-06-21 Date of Visit: 11/07/19 PCP: Towanda Octave, MD  Chief Complaint: vaginal discharge   Subjective: [name] is a pleasant 34 y.o. with medical history significant for recurrent BV presenting today for vaginal discharge.    She reports a several day history of malodorous, bubbling discharge. Sexually active with a female partner. Recently treated for BV with fluconazole for an unclear reason. Up to date on Pap smear. Using a new soap, no douching products.    ROS: Per HPI.   I have reviewed the patient's medical, surgical, family, and social history as appropriate.  Vitals:   11/07/19 1438  BP: 120/84  Pulse: 84  SpO2: 100%   Cardiac: Warm well perfused.  Capillary refill less than 3 seconds Respiratory breathing comfortably on room air Psych: Pleasant normal affect, appropriate, normal rate of speech GU Exam:  Chaperoned exam.  External exam: Normal-appearing female external genitalia.  Vaginal exam notable for slightly malodorous discharge.  Cervix without discharge or obvious lesion.    Bacterial vaginosis, most likely, possibly irritation, wet prep negative, given symptoms and recent treatment with antifungal with treat for BV. Negative yeast. GC/CT collected. Declined blood draw for other STI. Discharge may also be due to recent Metrogel use and new soap.    Attempted to call patient X2 with result---Sent via MyChart. Will follow up GC/CT.   Return to care as needed.   Terisa Starr, MD  Family Medicine Teaching Service

## 2019-11-08 ENCOUNTER — Telehealth: Payer: Self-pay | Admitting: Family Medicine

## 2019-11-08 DIAGNOSIS — B9689 Other specified bacterial agents as the cause of diseases classified elsewhere: Secondary | ICD-10-CM

## 2019-11-08 MED ORDER — METRONIDAZOLE 500 MG PO TABS: 500.0000 mg | ORAL_TABLET | Freq: Two times a day (BID) | ORAL | 0 refills | Status: AC

## 2019-11-08 NOTE — Telephone Encounter (Signed)
Called patient with results---she received fluconazole in ED. Rx for metronidazole for discharge.   Nancy Starr, MD  Family Medicine Teaching Service

## 2019-11-11 LAB — CERVICOVAGINAL ANCILLARY ONLY
Chlamydia: NEGATIVE
Comment: NEGATIVE
Comment: NORMAL
Neisseria Gonorrhea: NEGATIVE

## 2019-11-17 DIAGNOSIS — H5213 Myopia, bilateral: Secondary | ICD-10-CM | POA: Diagnosis not present

## 2019-12-02 ENCOUNTER — Other Ambulatory Visit: Payer: Self-pay

## 2019-12-02 ENCOUNTER — Ambulatory Visit (INDEPENDENT_AMBULATORY_CARE_PROVIDER_SITE_OTHER): Payer: Medicaid Other | Admitting: Family Medicine

## 2019-12-02 VITALS — BP 124/78

## 2019-12-02 DIAGNOSIS — M25562 Pain in left knee: Secondary | ICD-10-CM | POA: Diagnosis not present

## 2019-12-02 DIAGNOSIS — G8929 Other chronic pain: Secondary | ICD-10-CM

## 2019-12-02 MED ORDER — METHYLPREDNISOLONE ACETATE 80 MG/ML IJ SUSP
80.0000 mg | Freq: Once | INTRAMUSCULAR | Status: AC
  Administered 2019-12-02: 40 mg via INTRAMUSCULAR

## 2019-12-02 NOTE — Assessment & Plan Note (Signed)
Patient has history of multiple visits for left knee pain and swelling. 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 Pain with flexion and extension of that knee. She also has tenderness to palpation along the joint line and a mild effusion. There is also noted that patient has swelling of the left lower extremity below the knee. Patient reports that he gets better with elevation and that this is normal for when she has issues with that left knee. Patient denies any warmth, erythema in knee or left lower extremity. Patient uses ibuprofen and Tylenol for pain she is also used ice and heat with mild relief. -40 mg Depo-Medrol injection done today -Continue ibuprofen/Tylenol for pain as well as ice and heat -MRI of left knee ordered for further evaluation given the number of office visits with little to no relief. 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

## 2019-12-02 NOTE — Progress Notes (Addendum)
    SUBJECTIVE:   CHIEF COMPLAINT / HPI:  Left knee pain Patient reports longstanding history of left knee pain. She says that it occurs mainly when she has been on her feet for too long and improves with some rest but reports that this bothers her at least half the days of the month. Patient denies any locking but does admit to crepitus in that left knee. No fever, erythema, heat in that joint. Patient denies ever having an injection in the knee but has had 2 injections for carpal tunnel. She reports swelling in the left knee as well as left lower extremity and says that this usually happens when her knee swells. It has been evaluated in the past for possible DVT and was negative. Swelling decreases with elevation. Patient denies any pain in her calf or ankle. She reports having an x-ray approximately a year ago that she thought might have shown mild osteoarthritis but was negative for everything else. Patient reports she takes ibuprofen or Tylenol for pain management which helps minimally. She also uses ice and heat which she also sees mild benefit from. 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 Patient is interested in the injection today.  OBJECTIVE:   BP 124/78   LMP 11/11/2019 (Approximate)   General: Sitting comfortably in chair MSK: Mild effusion to left knee. No crepitus noted at this time. No joint laxity noticed at this time. Tenderness to palpation along joint line. Swelling in left lower extremity below the left knee all the way through her foot. No tenderness to palpation in left calf, ankle, or foot. No erythema or warmth noticed in her left calf or knee.  After consent was obtained, using sterile technique theknee was prepped and anesthetized with cold spray.  Steroid 40 mg and 4 ml plain Lidocaine was then injected and the needle withdrawn from the lateral infrapatellar approach.  The procedure was well tolerated.   The patient is asked to continue to rest the knee for a few more days before resuming regular activities.  It may be more painful for the first 1-2 days.  Watch for fever, or increased swelling or persistent pain in knee. Call or return to clinic prn if such symptoms occur or the knee fails to improve as anticipated.  ASSESSMENT/PLAN:   Left knee pain Patient has history of multiple visits for left knee pain and swelling. 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 Pain with flexion and extension of that knee. She also has tenderness to palpation along the joint line and a mild effusion. There is also noted that patient has swelling of the left lower extremity below the knee. Patient reports that he gets better with elevation and that this is normal for when she has issues with that left knee. Patient denies any warmth, erythema in knee or left lower extremity. Patient uses ibuprofen and Tylenol for pain she is also used ice and heat with mild relief. -40 mg Depo-Medrol injection done today -Continue ibuprofen/Tylenol for pain as well as ice and heat -MRI of left knee ordered for further evaluation given the number of office visits with little to no relief. -Strict return precautions given -Work note given for patient to be out of work for today and tomorrow   Derrel Nip, MD [facility]

## 2019-12-02 NOTE — Patient Instructions (Signed)
Was a pleasure to meet you today. I am sorry you are having so many issues with your knee pain. Today we gave you a steroid injection in your knee. The pain may decrease today but may be worse this afternoon and into tomorrow. It should start to feel better after that. I also ordered an MRI of your left knee. That will be done at [facility]. We will give you the number to call and schedule the MRI. If you notice any increased swelling, redness, warmth in the please be seen by medical professional. If you have any other questions or concerns please reach out to the clinic and we can schedule another appointment.   Knee Injection A knee injection is a procedure to get medicine into your knee joint to relieve the pain, swelling, and stiffness of arthritis. Your health care provider uses a needle to inject medicine, which may also help to lubricate and cushion your knee joint. You may need more than one injection. Tell a health care provider about:  Any allergies you have.  All medicines you are taking, including vitamins, herbs, eye drops, creams, and over-the-counter medicines.  Any problems you or family members have had with anesthetic medicines.  Any blood disorders you have.  Any surgeries you have had.  Any medical conditions you have.  Whether you are pregnant or may be pregnant. What are the risks? Generally, this is a safe procedure. However, problems may occur, including:  Infection.  Bleeding.  Symptoms that get worse.  Damage to the area around your knee.  Allergic reaction to any of the medicines.  Skin reactions from repeated injections. What happens before the procedure?  Ask your health care provider about changing or stopping your regular medicines. This is especially important if you are taking diabetes medicines or blood thinners.  Plan to have someone take you home from the hospital or clinic. What happens during the procedure?   You will sit or lie  down in a position for your knee to be treated.  The skin over your kneecap will be cleaned with a germ-killing soap.  You will be given a medicine that numbs the area (local anesthetic). You may feel some stinging.  The medicine will be injected into your knee. The needle is carefully placed between your kneecap and your knee. The medicine is injected into the joint space.  The needle will be removed at the end of the procedure.  A bandage (dressing) may be placed over the injection site. The procedure may vary among health care providers and hospitals. What can I expect after the procedure?  Your blood pressure, heart rate, breathing rate, and blood oxygen level will be monitored until you leave the hospital or clinic.  You may have to move your knee through its full range of motion. This helps to get all the medicine into your joint space.  You will be watched to make sure that you do not have a reaction to the injected medicine.  You may feel more pain, swelling, and warmth than you did before the injection. This reaction may last about 1-2 days. Follow these instructions at home: Medicines  Take over-the-counter and prescription medicines only as told by your doctor.  Do not drive or use heavy machinery while taking prescription pain medicine.  Do not take medicines such as aspirin and ibuprofen unless your health care provider tells you to take them. Injection site care  Follow instructions from your health care provider about: ?  How to take care of your puncture site. ? When and how you should change your dressing. ? When you should remove your dressing.  Check your injection area every day for signs of infection. Check for: ? More redness, swelling, or pain after 2 days. ? Fluid or blood. ? Pus or a bad smell. ? Warmth. Managing pain, stiffness, and swelling   If directed, put ice on the injection area: ? Put ice in a plastic bag. ? Place a towel between your  skin and the bag. ? Leave the ice on for 20 minutes, 2-3 times per day.  Do not apply heat to your knee.  Raise (elevate) the injection area above the level of your heart while you are sitting or lying down. General instructions  If you were given a dressing, keep it dry until your health care provider says it can be removed. Ask your health care provider when you can start showering or taking a bath.  Avoid strenuous activities for as long as directed by your health care provider. Ask your health care provider when you can return to your normal activities.  Keep all follow-up visits as told by your health care provider. This is important. You may need more injections. Contact a health care provider if you have:  A fever.  Warmth in your injection area.  Fluid, blood, or pus coming from your injection site.  Symptoms at your injection site that last longer than 2 days after your procedure. Get help right away if:  Your knee: ? Turns very red. ? Becomes very swollen. ? Is in severe pain. Summary  A knee injection is a procedure to get medicine into your knee joint to relieve the pain, swelling, and stiffness of arthritis.  A needle is carefully placed between your kneecap and your knee to inject medicine into the joint space.  Before the procedure, ask your health care provider about changing or stopping your regular medicines, especially if you are taking diabetes medicines or blood thinners.  Contact your health care provider if you have any problems or questions after your procedure. This information is not intended to replace advice given to you by your health care provider. Make sure you discuss any questions you have with your health care provider. Document Revised: 10/15/2017 Document Reviewed: 10/15/2017 Elsevier Patient Education  2020 [employer].

## 2019-12-04 ENCOUNTER — Telehealth: Payer: Self-pay

## 2019-12-04 NOTE — Telephone Encounter (Signed)
Patient calls nurse line regarding doctor's note from 12/02/19. Patient states that she returned to work today but was sent home due to increased pain after standing for prolonged period. Patient states that management says that she should not return to work until Monday. Patient also says that management states that work note should include reason for why patient needs to be out of work.   Please advise  Veronda Prude, RN

## 2019-12-05 NOTE — Telephone Encounter (Signed)
Spoke with patient regarding her doctors note.  She reports that she went back to work yesterday because she no longer had a doctor's note but that her knee was still bothering her a little bit and so she was sent home.  She says that her knee is feeling better today she is able to ambulate well.  She is reporting issue with her boss who says that he needs a reason why she was seen in our office.  I informed her that we do not have the list diagnoses or procedures that are done at office visits on patient's work notes.  That legally their work has no right to know because it is private health information.  I informed the patient that I am willing to write a more detailed note if she deems it necessary and is okay with it but all that we must actually provide is a letter stating that she was evaluated in our clinic and that we deemed it necessary for her to be out of work for 2 days.  Patient reports that she understands and that she will let me know if she needs anything else but there is no need for a new note at this time.

## 2019-12-07 DIAGNOSIS — Z20822 Contact with and (suspected) exposure to covid-19: Secondary | ICD-10-CM | POA: Diagnosis not present

## 2020-01-15 ENCOUNTER — Other Ambulatory Visit (HOSPITAL_COMMUNITY)
Admission: RE | Admit: 2020-01-15 | Discharge: 2020-01-15 | Disposition: A | Discharge status: Home / Self Care | Payer: Medicaid Other | Source: Ambulatory Visit | Attending: Family Medicine | Admitting: Family Medicine

## 2020-01-15 ENCOUNTER — Other Ambulatory Visit: Payer: Self-pay

## 2020-01-15 ENCOUNTER — Ambulatory Visit (INDEPENDENT_AMBULATORY_CARE_PROVIDER_SITE_OTHER): Payer: Medicaid Other | Admitting: Family Medicine

## 2020-01-15 ENCOUNTER — Encounter: Payer: Self-pay | Admitting: Family Medicine

## 2020-01-15 VITALS — BP 118/72 | HR 102 | Wt 221.8 lb

## 2020-01-15 DIAGNOSIS — N898 Other specified noninflammatory disorders of vagina: Secondary | ICD-10-CM | POA: Insufficient documentation

## 2020-01-15 LAB — POCT WET PREP (WET MOUNT)
Clue Cells Wet Prep Whiff POC: POSITIVE
Trichomonas Wet Prep HPF POC: ABSENT

## 2020-01-15 NOTE — Patient Instructions (Signed)
It was a pleasure to see you today! Thank you for choosing [facility] for your primary care. Nancy Young was seen for vaginal discharge. Come back to the clinic if there is anything we can do for you.  Today we did a swab to check for infection.  We will get a hold of you soon as we get the results of that we can find out if you need treatment.   Please bring all your medications to every doctors visit   Sign up for My Chart to have easy access to your labs results, and communication with your Primary care physician.     Please check-out at the front desk before leaving the clinic.     Best,  Dr. Marthenia Rolling FAMILY MEDICINE RESIDENT - PGY3 01/15/2020 4:08 PM

## 2020-01-16 LAB — CERVICOVAGINAL ANCILLARY ONLY
Chlamydia: NEGATIVE
Comment: NEGATIVE
Comment: NORMAL
Neisseria Gonorrhea: NEGATIVE

## 2020-01-16 MED ORDER — METRONIDAZOLE 500 MG PO TABS: 500.0000 mg | ORAL_TABLET | Freq: Two times a day (BID) | ORAL | 0 refills | Status: DC

## 2020-01-16 NOTE — Progress Notes (Signed)
    SUBJECTIVE:   CHIEF COMPLAINT / HPI: Vaginal discharge  Patient with increasing vaginal discharge over the last week, she is unable to characterize exactly but says that she feels it does have an odor.  She denies any lesions, refuses HIV and RPR testing, denies any urinary symptoms denies any bowel symptoms or abdominal pain.  She says she is not sexually active with males and declines a pregnancy test  PERTINENT  PMH / PSH:   OBJECTIVE:   BP 118/72   Pulse (!) 102   Wt 221 lb 12.8 oz (100.6 kg)   LMP 12/16/2019 (Approximate)   SpO2 96%   BMI 39.29 kg/m   General: Very pleasant and alert, no acute distress Respiratory: No increased work of breathing, no cough GU: Exam performed with CMA Sheri in the room the entire time*no external pathological lesions identified, there is slight discharge, no bleeding during exam  ASSESSMENT/PLAN:   Vaginal discharge BV found on swab, metronidazole ordered and patient notified via MyChart     Marthenia Rolling, DO [facility]

## 2020-01-16 NOTE — Assessment & Plan Note (Signed)
BV found on swab, metronidazole ordered and patient notified via MyChart

## 2020-01-19 ENCOUNTER — Telehealth: Payer: Self-pay

## 2020-01-19 DIAGNOSIS — B9689 Other specified bacterial agents as the cause of diseases classified elsewhere: Secondary | ICD-10-CM

## 2020-01-19 DIAGNOSIS — N760 Acute vaginitis: Secondary | ICD-10-CM

## 2020-01-19 NOTE — Telephone Encounter (Signed)
Patient calls nurse line regarding recent test results. Informed patient of rx sent into pharmacy, patient states that flagyl causes severe GI upset. Patient requesting that she receive a vaginal insert to treat BV and also medication to treat yeast infection. Patient states that in the past she gets yeast infection after treatment of BV.   To prescribing doctor and PCP  Please advise  Veronda Prude, RN

## 2020-01-20 MED ORDER — METRONIDAZOLE 0.75 % VA GEL: 1.0000 | Freq: Two times a day (BID) | VAGINAL | 0 refills | Status: AC

## 2020-01-20 MED ORDER — FLUCONAZOLE 150 MG PO TABS: 150.0000 mg | ORAL_TABLET | Freq: Once | ORAL | 0 refills | Status: AC

## 2020-01-20 NOTE — Telephone Encounter (Signed)
Noted and agreed.

## 2020-01-20 NOTE — Telephone Encounter (Signed)
Pt informed. Bryah Ocheltree T Steffanie Mingle, CMA  

## 2020-01-20 NOTE — Telephone Encounter (Signed)
As patient requested, vaginal suppository metronidazole ordered as well as diflucan in case she gets a yeast infection during treatment  -Dr. Parke Simmers

## 2020-02-25 ENCOUNTER — Telehealth: Payer: Self-pay | Admitting: Family Medicine

## 2020-02-25 NOTE — Telephone Encounter (Signed)
Patient has missed call from our office and she thinks it was Dr. Allena Katz. I did not see anything documented on who called. Patient would like for whoever it was to call her back at [phone]

## 2020-02-25 NOTE — Telephone Encounter (Signed)
I can confirm I did not call the patient!

## 2020-02-29 IMAGING — CR DG FOOT COMPLETE 3+V*L*
3 series · 3 of 3 positions shown · non-contrast
Comparison: None.

CLINICAL DATA: Left foot pain post fall

EXAM:
LEFT FOOT - COMPLETE 3+ VIEW

[x foot ap left]
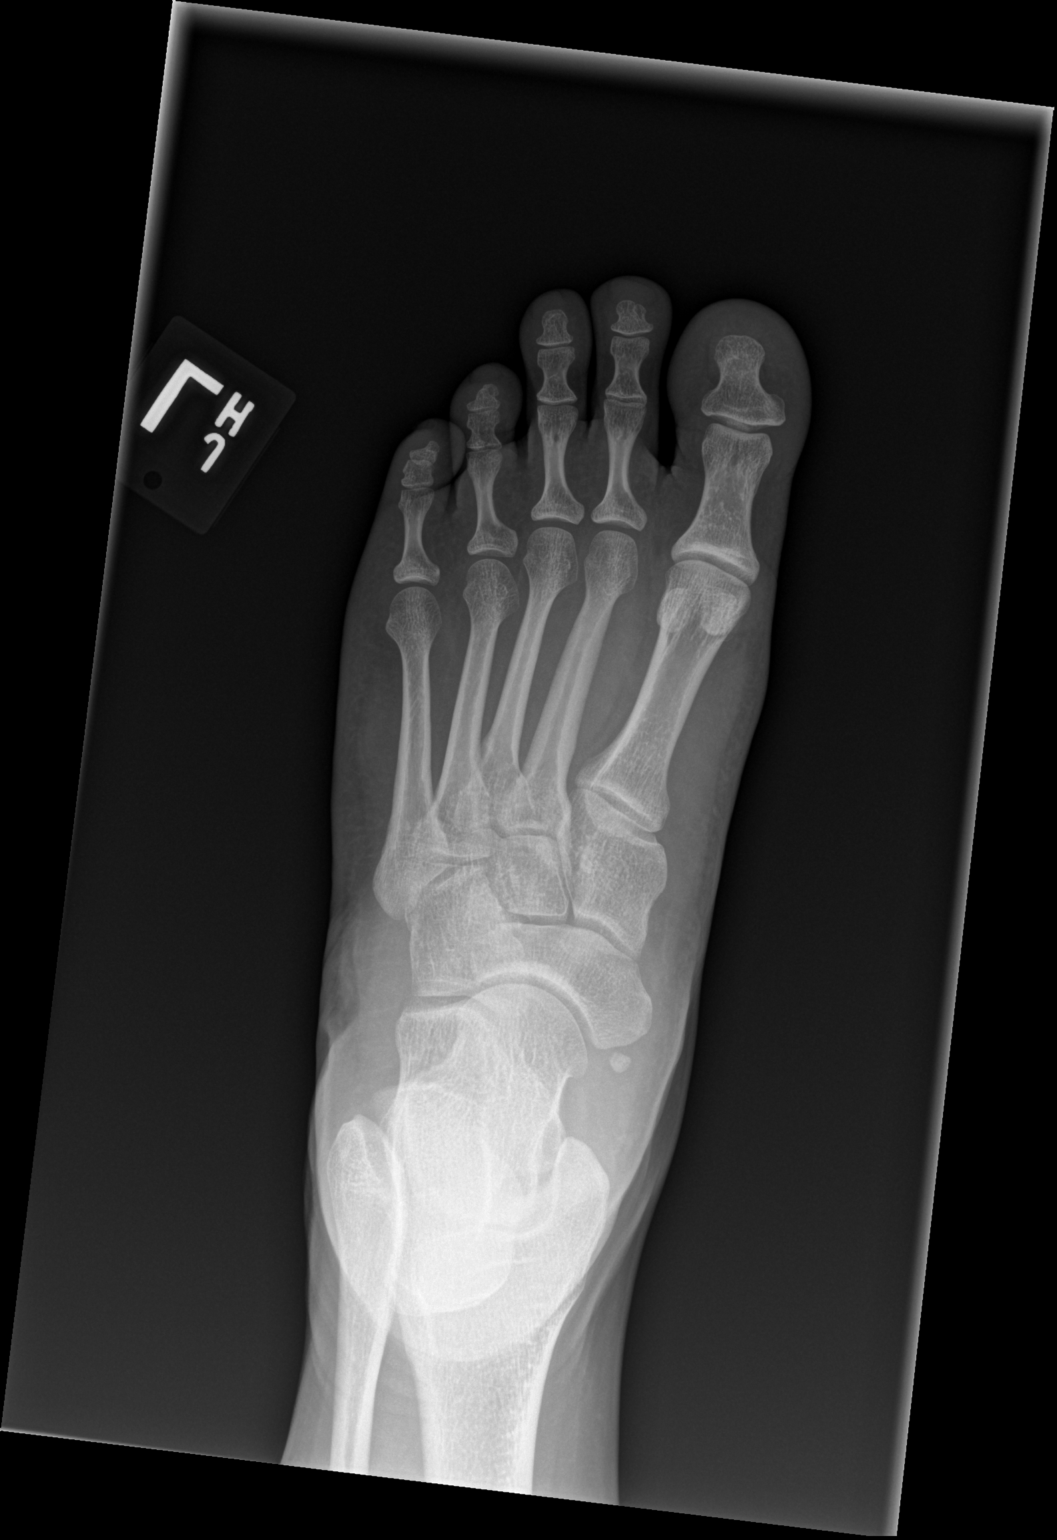

[x foot obl left]
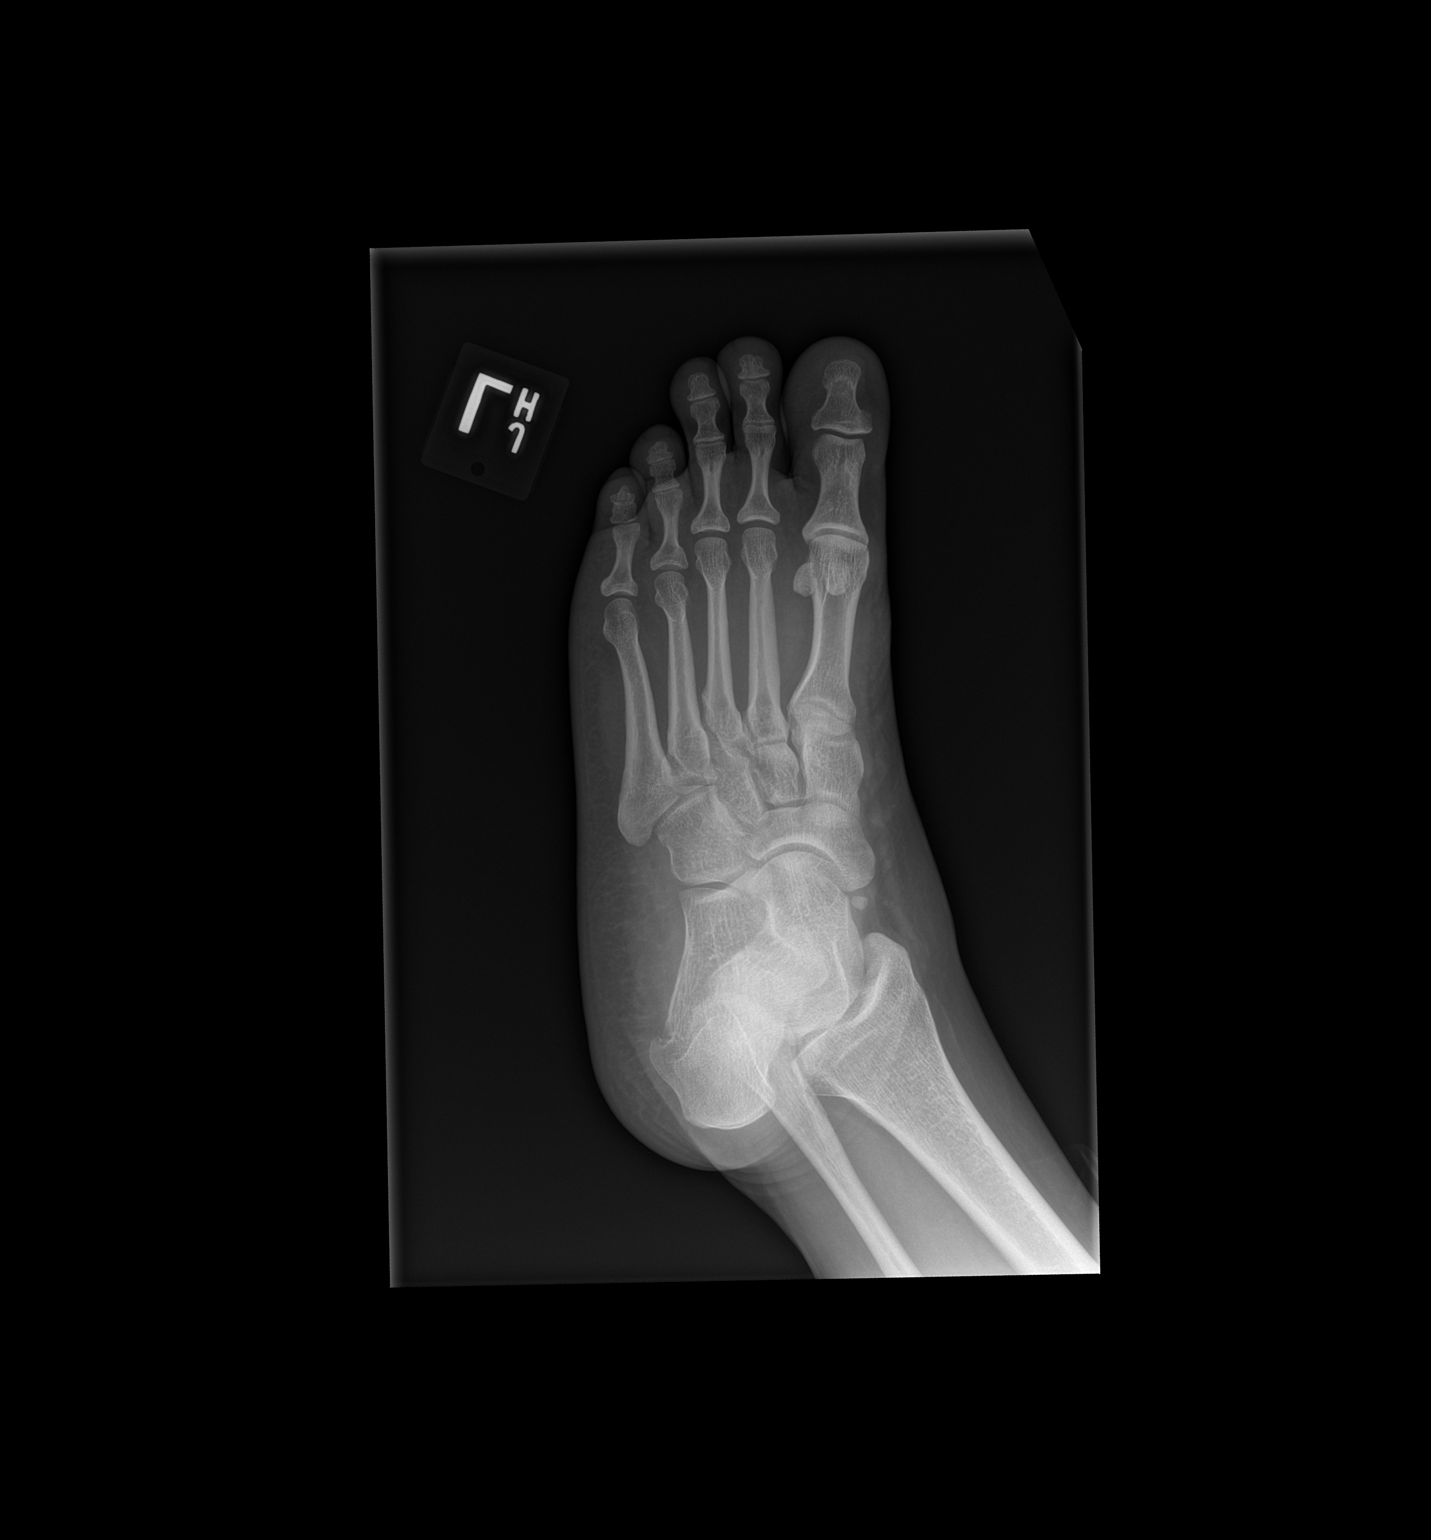

[x foot lat left]
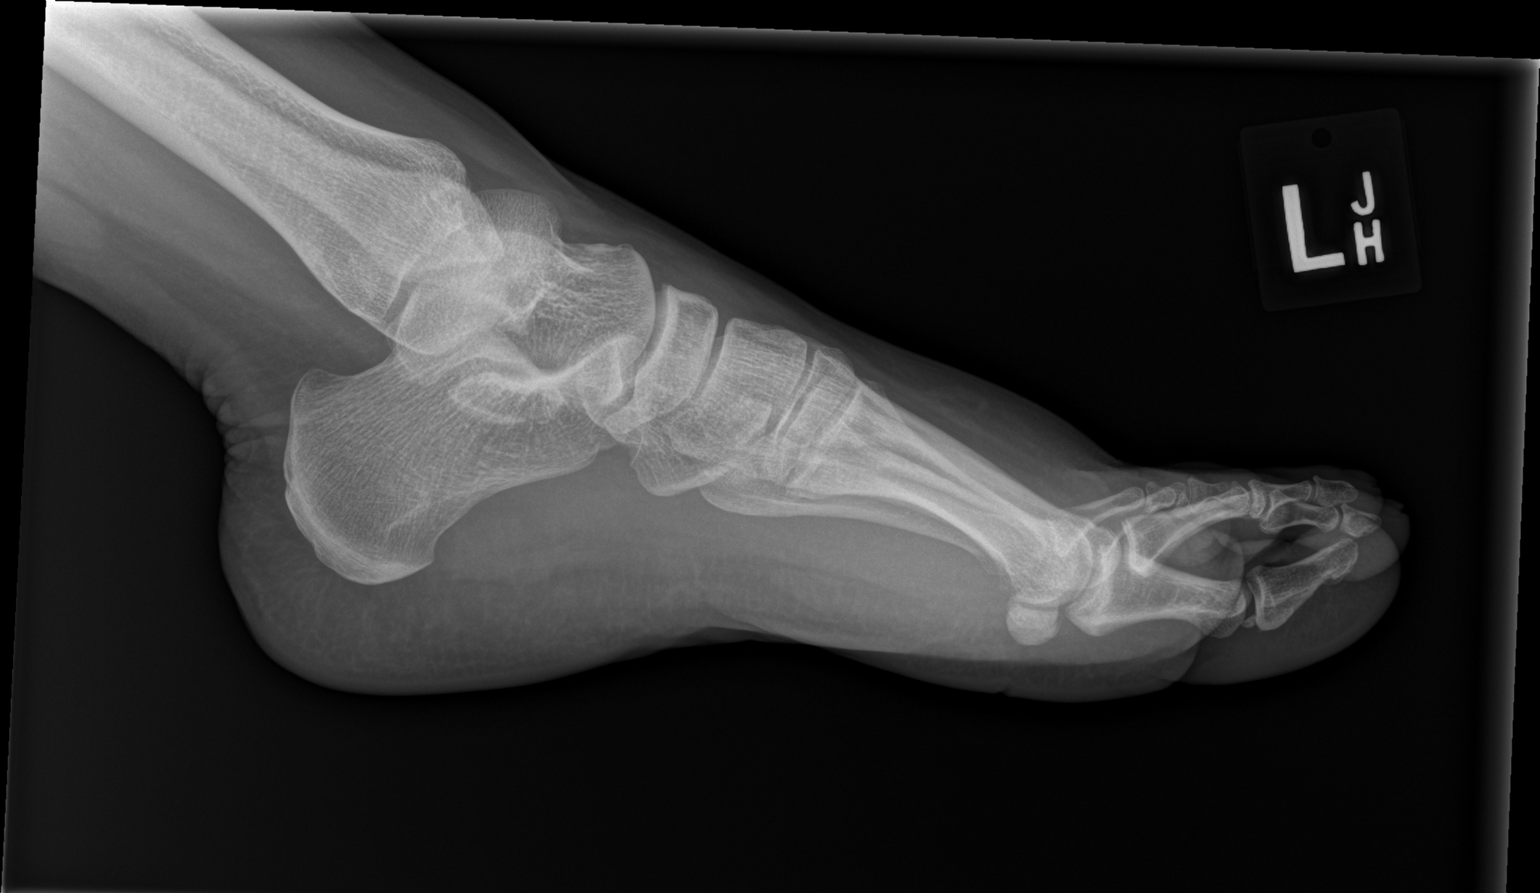

[3 of 3 positions shown; findings below may reference images not displayed]

FINDINGS: There is no evidence of fracture or dislocation. There is no
evidence of arthropathy or other focal bone abnormality. Soft
tissues are unremarkable.
IMPRESSION: Negative.

## 2020-04-12 DIAGNOSIS — Z20822 Contact with and (suspected) exposure to covid-19: Secondary | ICD-10-CM | POA: Diagnosis not present

## 2020-05-17 ENCOUNTER — Other Ambulatory Visit: Payer: Self-pay

## 2020-05-17 ENCOUNTER — Other Ambulatory Visit (HOSPITAL_COMMUNITY)
Admission: RE | Admit: 2020-05-17 | Discharge: 2020-05-17 | Disposition: A | Discharge status: Home / Self Care | Payer: Medicaid Other | Source: Ambulatory Visit | Attending: Family Medicine | Admitting: Family Medicine

## 2020-05-17 ENCOUNTER — Ambulatory Visit (INDEPENDENT_AMBULATORY_CARE_PROVIDER_SITE_OTHER): Payer: Medicaid Other | Admitting: Family Medicine

## 2020-05-17 ENCOUNTER — Encounter: Payer: Self-pay | Admitting: Family Medicine

## 2020-05-17 VITALS — BP 114/80 | HR 86 | Ht 63.0 in | Wt 227.6 lb

## 2020-05-17 DIAGNOSIS — R109 Unspecified abdominal pain: Secondary | ICD-10-CM

## 2020-05-17 DIAGNOSIS — R739 Hyperglycemia, unspecified: Secondary | ICD-10-CM

## 2020-05-17 DIAGNOSIS — R1032 Left lower quadrant pain: Secondary | ICD-10-CM | POA: Insufficient documentation

## 2020-05-17 DIAGNOSIS — N898 Other specified noninflammatory disorders of vagina: Secondary | ICD-10-CM

## 2020-05-17 LAB — POCT WET PREP (WET MOUNT)
Clue Cells Wet Prep Whiff POC: NEGATIVE
Trichomonas Wet Prep HPF POC: ABSENT

## 2020-05-17 LAB — POCT URINALYSIS DIP (MANUAL ENTRY)
Blood, UA: NEGATIVE
Glucose, UA: NEGATIVE mg/dL
Ketones, POC UA: NEGATIVE mg/dL
Leukocytes, UA: NEGATIVE
Nitrite, UA: NEGATIVE
Protein Ur, POC: NEGATIVE mg/dL
Spec Grav, UA: >=1.03 — AB (ref 1.010–1.025)
Urobilinogen, UA: 0.2 E.U./dL
pH, UA: 5.5 (ref 5.0–8.0)

## 2020-05-17 NOTE — Patient Instructions (Signed)
It was a pleasure to see you today!  Thank you for choosing [facility] for your primary care.  Nancy Young was seen for left lower quadrant abdominal pain.   Our plans for today were:  I have collected blood work and vaginal swabs to try to assess for reason for your lower abdominal pain.  Once these labs are resulted, I will contact you to notify you of any new prescriptions or changes to your therapy.  In the meantime I recommend continue to stay hydrated and follow-up with [place] if your symptoms worsen or do not improve.  You should return to our clinic as needed or if symptoms persist.    Best Wishes,   Dr. Neita Garnet     Abdominal Pain, Adult Pain in the abdomen (abdominal pain) can be caused by many things. Often, abdominal pain is not serious and it gets better with no treatment or by being treated at home. However, sometimes abdominal pain is serious. Your health care provider will ask questions about your medical history and do a physical exam to try to determine the cause of your abdominal pain. Follow these instructions at home:  Medicines  Take over-the-counter and prescription medicines only as told by your health care provider.  Do not take a laxative unless told by your health care provider. General instructions  Watch your condition for any changes.  Drink enough fluid to keep your urine pale yellow.  Keep all follow-up visits as told by your health care provider. This is important. Contact a health care provider if:  Your abdominal pain changes or gets worse.  You are not hungry or you lose weight without trying.  You are constipated or have diarrhea for more than 2-3 days.  You have pain when you urinate or have a bowel movement.  Your abdominal pain wakes you up at night.  Your pain gets worse with meals, after eating, or with certain foods.  You are vomiting and cannot keep anything down.  You have a fever.  You have  blood in your urine. Get help right away if:  Your pain does not go away as soon as your health care provider told you to expect.  You cannot stop vomiting.  Your pain is only in areas of the abdomen, such as the right side or the left lower portion of the abdomen. Pain on the right side could be caused by appendicitis.  You have bloody or black stools, or stools that look like tar.  You have severe pain, cramping, or bloating in your abdomen.  You have signs of dehydration, such as: ? Dark urine, very little urine, or no urine. ? Cracked lips. ? Dry mouth. ? Sunken eyes. ? Sleepiness. ? Weakness.  You have trouble breathing or chest pain. Summary  Often, abdominal pain is not serious and it gets better with no treatment or by being treated at home. However, sometimes abdominal pain is serious.  Watch your condition for any changes.  Take over-the-counter and prescription medicines only as told by your health care provider.  Contact a health care provider if your abdominal pain changes or gets worse.  Get help right away if you have severe pain, cramping, or bloating in your abdomen. This information is not intended to replace advice given to you by your health care provider. Make sure you discuss any questions you have with your health care provider. Document Revised: 02/03/2019 Document Reviewed: 02/03/2019 Elsevier Patient Education  2020 [employer].

## 2020-05-17 NOTE — Assessment & Plan Note (Signed)
Patient could have consitpation, yeast infection

## 2020-05-17 NOTE — Assessment & Plan Note (Signed)
Patient reports frequent bacterial vaginosis infections. Reports vaginal discharge for several days. Has not been on any antibiotics recently. -Wet prep -GC

## 2020-05-17 NOTE — Progress Notes (Signed)
    SUBJECTIVE:   CHIEF COMPLAINT / HPI: left sided abdominal pain   Patient reports having left sided abdominal pain for the past two weeks.  She reports that her bowel movements are mostly normal but has noticed looser stools lately. Patient denies melena or hematochezia. Denies constipation, reports at least one or two BM daily. Denies fevers or chills. Denies polydipsia. Reports that she feels "full a lot", denies nausea. Patient is requesting to have hemoglobin A1c checked due to concern for DM, stating that it runs in her family.    Vaginal Discharge  Reports some vaginal discharge similar to her frequent BV episodes.  Denies abnormal vaginal bleeding. LMP on the 8th of July, regular menses.  Sexually active with female partner, no barriers, monogamous, reports that she has not had intercourse with a female in over a decade. Patient declines UPT after discussion of differential diagnosis given symptoms.  Denies new medications vitamins or supplements.   Denies fevers, chills, reports some back pain  Reports history of an ovarian cyst several years ago  Denies dysuria or hematuria     PERTINENT  PMH / PSH:  Obesity  BV    OBJECTIVE:   BP 114/80   Pulse 86   Ht 5\' 3"  (1.6 m)   Wt 227 lb 9.6 oz (103.2 kg)   LMP 04/15/2020 (Exact Date)   SpO2 100%   BMI 40.32 kg/m   General: Female appearing stated age in no acute distress Cardio: Normal S1 and S2, no S3 or S4. Rhythm is regular. No murmurs or rubs.  Bilateral radial pulses palpable Pulm: Clear to auscultation bilaterally, no crackles, wheezing, or diminished breath sounds. Normal respiratory effort Abdomen: Bowel sounds normal. Abdomen soft, tenderness in left lower quadrant, no umbilical tenderness, no signs of peritoneal tenderness, negative rovsig sign  GU: Pink cervix, scant white discharge, no bleeding, no cervical erosion or ulceration, no cervical motion tenderness but tenderness in the adnexal area  ASSESSMENT/PLAN:    Left lower quadrant abdominal pain Patient could have consitpation, yeast infection  Abdominal pain in female Differential diagnosis includes urinary tract infection, bacterial vaginosis, sexually transmitted infection. Considered pregnancy however patient states that she has not had intercourse with a man in over 10 years. Patient also declined second pregnancy test today. Patient also expresses concern for presentation of new onset diabetes. Denies any symptoms consistent with constipation but also considered on differential -Measure hemoglobin A1c, per patient request CMP -CBC -Urine analysis  Vaginal discharge Patient reports frequent bacterial vaginosis infections. Reports vaginal discharge for several days. Has not been on any antibiotics recently. -Wet prep -GC      06/16/2020, MD [facility]

## 2020-05-17 NOTE — Assessment & Plan Note (Addendum)
Differential diagnosis includes urinary tract infection,recurrent bacterial vaginosis, sexually transmitted infection. Considered pregnancy however patient states that she has not had intercourse with a man in over 10 years. Patient also declined urinary pregnancy test today. Patient also expresses concern for presentation of new onset diabetes. Denies any symptoms consistent with constipation but also considered.  -Measure hemoglobin A1c, per patient request -CMP -CBC -Urine analysis

## 2020-05-18 LAB — CBC WITH DIFFERENTIAL/PLATELET
Basophils Absolute: 0 10*3/uL (ref 0.0–0.2)
Basos: 1 %
EOS (ABSOLUTE): 0.1 10*3/uL (ref 0.0–0.4)
Eos: 2 %
Hematocrit: 35.8 % (ref 34.0–46.6)
Hemoglobin: 11.9 g/dL (ref 11.1–15.9)
Immature Grans (Abs): 0 10*3/uL (ref 0.0–0.1)
Immature Granulocytes: 0 %
Lymphocytes Absolute: 2.7 10*3/uL (ref 0.7–3.1)
Lymphs: 41 %
MCH: 29.2 pg (ref 26.6–33.0)
MCHC: 33.2 g/dL (ref 31.5–35.7)
MCV: 88 fL (ref 79–97)
Monocytes Absolute: 0.4 10*3/uL (ref 0.1–0.9)
Monocytes: 7 %
Neutrophils Absolute: 3.2 10*3/uL (ref 1.4–7.0)
Neutrophils: 49 %
Platelets: 265 10*3/uL (ref 150–450)
RBC: 4.07 x10E6/uL (ref 3.77–5.28)
RDW: 13.2 % (ref 11.7–15.4)
WBC: 6.5 10*3/uL (ref 3.4–10.8)

## 2020-05-18 LAB — COMPREHENSIVE METABOLIC PANEL
ALT: 23 IU/L (ref 0–32)
AST: 20 IU/L (ref 0–40)
Albumin/Globulin Ratio: 1.6 (ref 1.2–2.2)
Albumin: 4.2 g/dL (ref 3.8–4.8)
Alkaline Phosphatase: 83 IU/L (ref 48–121)
BUN/Creatinine Ratio: 14 (ref 9–23)
BUN: 11 mg/dL (ref 6–20)
Bilirubin Total: 0.3 mg/dL (ref 0.0–1.2)
CO2: 23 mmol/L (ref 20–29)
Calcium: 9.5 mg/dL (ref 8.7–10.2)
Chloride: 105 mmol/L (ref 96–106)
Creatinine, Ser: 0.81 mg/dL (ref 0.57–1.00)
GFR calc Af Amer: 110 mL/min/{1.73_m2} (ref >59)
GFR calc non Af Amer: 96 mL/min/{1.73_m2} (ref >59)
Globulin, Total: 2.7 g/dL (ref 1.5–4.5)
Glucose: 102 mg/dL — ABNORMAL HIGH (ref 65–99)
Potassium: 4 mmol/L (ref 3.5–5.2)
Sodium: 140 mmol/L (ref 134–144)
Total Protein: 6.9 g/dL (ref 6.0–8.5)

## 2020-05-18 LAB — CERVICOVAGINAL ANCILLARY ONLY
Chlamydia: NEGATIVE
Comment: NEGATIVE
Comment: NORMAL
Neisseria Gonorrhea: NEGATIVE

## 2020-05-18 LAB — HEMOGLOBIN A1C
Est. average glucose Bld gHb Est-mCnc: 128 mg/dL
Hgb A1c MFr Bld: 6.1 % — ABNORMAL HIGH (ref 4.8–5.6)

## 2020-05-19 ENCOUNTER — Telehealth: Payer: Self-pay | Admitting: Family Medicine

## 2020-05-19 ENCOUNTER — Telehealth: Payer: Self-pay

## 2020-05-19 NOTE — Telephone Encounter (Signed)
Patient calls nurse line requesting results from recent OV on 05/17/2020. Informed patient of results per Dr. Sharol Harness- Robinson's result note. Answered all questions. Patient verbalized understanding.   Veronda Prude, RN

## 2020-05-19 NOTE — Telephone Encounter (Signed)
Contacted patient in order to discuss results of U/A, wet prep, CMP and CBC. All normal. Informed patient of hgb A1c in prediabetes range and encouraged limiting pasta, bread, rice and desserts. Encouraged regular physical activity. Patient verbalized understanding.

## 2020-05-20 NOTE — Telephone Encounter (Signed)
Thank you Nancy Young

## 2020-06-22 ENCOUNTER — Other Ambulatory Visit: Payer: Self-pay

## 2020-06-22 ENCOUNTER — Encounter (HOSPITAL_COMMUNITY): Payer: Self-pay | Admitting: Emergency Medicine

## 2020-06-22 ENCOUNTER — Ambulatory Visit (HOSPITAL_COMMUNITY)
Admission: EM | Admit: 2020-06-22 | Discharge: 2020-06-22 | Disposition: A | Discharge status: Home / Self Care | Payer: Medicaid Other | Attending: Family Medicine | Admitting: Family Medicine

## 2020-06-22 DIAGNOSIS — Z20822 Contact with and (suspected) exposure to covid-19: Secondary | ICD-10-CM | POA: Diagnosis not present

## 2020-06-22 DIAGNOSIS — Z6841 Body Mass Index (BMI) 40.0 and over, adult: Secondary | ICD-10-CM | POA: Insufficient documentation

## 2020-06-22 DIAGNOSIS — Z1152 Encounter for screening for COVID-19: Secondary | ICD-10-CM | POA: Diagnosis not present

## 2020-06-22 DIAGNOSIS — Z0189 Encounter for other specified special examinations: Secondary | ICD-10-CM | POA: Insufficient documentation

## 2020-06-22 DIAGNOSIS — E669 Obesity, unspecified: Secondary | ICD-10-CM | POA: Insufficient documentation

## 2020-06-22 NOTE — ED Triage Notes (Signed)
atient presents because son is having URI symptoms and she wants a cOVID test.  Denies symptoms.

## 2020-06-23 LAB — SARS CORONAVIRUS 2 (TAT 6-24 HRS): SARS Coronavirus 2: NEGATIVE

## 2020-06-23 NOTE — ED Provider Notes (Signed)
[facility]   [id number] 06/22/20 Arrival Time: 1913  ASSESSMENT & PLAN:  1. Patient request for diagnostic testing      COVID-19 testing sent. See letter/work note on file for self-isolation guidelines. OTC symptom care as needed.    Reviewed expectations re: course of current medical issues. Questions answered. Outlined signs and symptoms indicating need for more acute intervention. Understanding verbalized. After Visit Summary given.   SUBJECTIVE: History from: patient. Nancy Young is a 34 y.o. female who requests COVID-19 testing. Known COVID-19 contact: none but child is sick with cold symptoms. Recent travel: none. Reports no symptoms. Normal PO intake without n/v/d.    OBJECTIVE:  Vitals:   06/22/20 2018  BP: 135/89  Pulse: 83  Resp: 18  Temp: 98.8 F (37.1 C)  TempSrc: Oral  SpO2: 98%    General appearance: alert; no distress Eyes: PERRLA; EOMI; conjunctiva normal HENT: [place]; AT; nasal mucosa normal; oral mucosa normal Neck: supple  Lungs: speaks full sentences without difficulty; unlabored Extremities: no edema Skin: warm and dry Neurologic: normal gait Psychological: alert and cooperative; normal mood and affect  Labs:  Labs Reviewed  SARS CORONAVIRUS 2 (TAT 6-24 HRS)     No Known Allergies  Past Medical History:  Diagnosis Date  . BV (bacterial vaginosis)   . Obesity   . Vaginal discharge 07/27/2017  . Yeast infection    Social History   Socioeconomic History  . Marital status: Single    Spouse name: Not on file  . Number of children: Not on file  . Years of education: Not on file  . Highest education level: Not on file  Occupational History  . Occupation: CNA  Tobacco Use  . Smoking status: Never Smoker  . Smokeless tobacco: Never Used  Vaping Use  . Vaping Use: Never used  Substance and Sexual Activity  . Alcohol use: Yes    Comment: 1-2 drinks per 2-3 times/week  . Drug use: No  . Sexual activity:  Yes    Partners: Female    Birth control/protection: None  Other Topics Concern  . Not on file  Social History Narrative   Lives with son   Social Determinants of Health   Financial Resource Strain:   . Difficulty of Paying Living Expenses: Not on file  Food Insecurity:   . Worried About Programme researcher, broadcasting/film/video in the Last Year: Not on file  . Ran Out of Food in the Last Year: Not on file  Transportation Needs:   . Lack of Transportation (Medical): Not on file  . Lack of Transportation (Non-Medical): Not on file  Physical Activity:   . Days of Exercise per Week: Not on file  . Minutes of Exercise per Session: Not on file  Stress:   . Feeling of Stress : Not on file  Social Connections:   . Frequency of Communication with Friends and Family: Not on file  . Frequency of Social Gatherings with Friends and Family: Not on file  . Attends Religious Services: Not on file  . Active Member of Clubs or Organizations: Not on file  . Attends Banker Meetings: Not on file  . Marital Status: Not on file  Intimate Partner Violence:   . Fear of Current or Ex-Partner: Not on file  . Emotionally Abused: Not on file  . Physically Abused: Not on file  . Sexually Abused: Not on file   Family History  Problem Relation Age of Onset  . Diabetes Mother   .  Hypertension Mother   . Hyperlipidemia Mother   . Bipolar disorder Mother   . Diabetes Maternal Grandmother   . Sickle cell anemia Maternal Grandmother   . Heart attack Maternal Grandmother   . Anxiety disorder Maternal Grandmother   . Hypertension Maternal Grandmother   . Hyperlipidemia Maternal Grandmother   . Stroke Maternal Grandmother   . Hepatitis Maternal Grandfather   . Lung disease Maternal Grandfather   . Diabetes Paternal Grandmother   . Hypertension Paternal Grandmother   . Hyperlipidemia Paternal Grandmother   . Kidney disease Maternal Aunt   . Diabetes Maternal Aunt   . Hypertension Maternal Aunt   . Stomach  cancer Other    Past Surgical History:  Procedure Laterality Date  . LEEP  2013     Mardella Layman, MD 06/23/20 0900

## 2020-06-24 DIAGNOSIS — Z03818 Encounter for observation for suspected exposure to other biological agents ruled out: Secondary | ICD-10-CM | POA: Diagnosis not present

## 2020-07-29 ENCOUNTER — Other Ambulatory Visit: Payer: Self-pay

## 2020-07-29 ENCOUNTER — Ambulatory Visit (INDEPENDENT_AMBULATORY_CARE_PROVIDER_SITE_OTHER): Payer: Medicaid Other | Admitting: Family Medicine

## 2020-07-29 ENCOUNTER — Other Ambulatory Visit (HOSPITAL_COMMUNITY)
Admission: RE | Admit: 2020-07-29 | Discharge: 2020-07-29 | Disposition: A | Discharge status: Home / Self Care | Payer: Medicaid Other | Source: Ambulatory Visit | Attending: Family Medicine | Admitting: Family Medicine

## 2020-07-29 VITALS — BP 110/84 | HR 85 | Ht 64.0 in | Wt 231.0 lb

## 2020-07-29 DIAGNOSIS — N898 Other specified noninflammatory disorders of vagina: Secondary | ICD-10-CM | POA: Insufficient documentation

## 2020-07-29 DIAGNOSIS — R350 Frequency of micturition: Secondary | ICD-10-CM

## 2020-07-29 LAB — POCT URINALYSIS DIP (CLINITEK)
Bilirubin, UA: NEGATIVE
Blood, UA: NEGATIVE
Glucose, UA: NEGATIVE mg/dL
Ketones, POC UA: NEGATIVE mg/dL
Leukocytes, UA: NEGATIVE
Nitrite, UA: NEGATIVE
POC PROTEIN,UA: NEGATIVE
Spec Grav, UA: 1.02 (ref 1.010–1.025)
Urobilinogen, UA: 0.2 E.U./dL
pH, UA: 6 (ref 5.0–8.0)

## 2020-07-29 LAB — POCT WET PREP (WET MOUNT)
Clue Cells Wet Prep Whiff POC: NEGATIVE
Trichomonas Wet Prep HPF POC: ABSENT

## 2020-07-29 LAB — POCT URINE PREGNANCY: Preg Test, Ur: NEGATIVE

## 2020-07-29 NOTE — Progress Notes (Signed)
    SUBJECTIVE:   CHIEF COMPLAINT / HPI:   Vaginal discharge, increased urination Patient with symptoms a little more than 1 week.  Reports that she has a bout to start her period with her last menstrual cycle being approximately 1 month ago.  Is sexually active with women.  Feels like this is just a slight increase in her vaginal discharge and is concerned she may have bacterial vaginosis or yeast infection given she has had this in the past.  Denies any known contact with STIs.  Denies any abdominal pain.  Reports that she may have a slight increase in urination but denies any burning with urination.  Denies any blood in her urine.  OBJECTIVE:   BP 110/84   Pulse 85   Ht 5\' 4"  (1.626 m)   Wt 231 lb (104.8 kg)   LMP 06/30/2020   SpO2 99%   BMI 39.65 kg/m   General: Well-appearing 34 year old female, no acute distress Cardiac: Regular rate and rhythm, no murmurs appreciated Respiratory: Normal work of breathing Abdomen: Soft, nontender, positive bowel sounds GU: Chaperone is present for this exam, normal external female genitalia, no rashes noted, mild amount of white discharge.  No erythema of the vagina, no lesions noted, no bleeding noted.  Cervix is normal with no friability or abnormal discharge.  Wet prep and GC chlamydia collected   ASSESSMENT/PLAN:   Vaginal discharge Patient reports history of bacterial vaginosis infections and is concerned that she may have reinfection.  No recent antibiotics. -Physical exam is reassuring -UA does not indicate a UTI -Wet prep showed no clue cells, yeast, trichomonas -Gonorrhea and chlamydia collected -Given patient is getting ready to start her period this may be physiologic discharge.  Will not treat at this time but strict return precautions given.     32, MD [facility]

## 2020-07-29 NOTE — Patient Instructions (Addendum)
It was a pleasure to see you today.  I am sorry you are having the symptoms.  The wet prep that we collected looked normal and your urine looks good.  I cannot find the results indicating that you have any type of infection at this time.  This may be physiologic discharge given your about to start your menses.  If the symptoms do not resolve after menses please let me know.  I will be following up on the results from the other test that we collected today and will call you if there are any abnormalities.  I happy have a wonderful afternoon!

## 2020-07-30 LAB — CERVICOVAGINAL ANCILLARY ONLY
Chlamydia: NEGATIVE
Comment: NEGATIVE
Comment: NORMAL
Neisseria Gonorrhea: NEGATIVE

## 2020-07-30 NOTE — Assessment & Plan Note (Addendum)
Patient reports history of bacterial vaginosis infections and is concerned that she may have reinfection.  No recent antibiotics. -Physical exam is reassuring -UA does not indicate a UTI -Wet prep showed no clue cells, yeast, trichomonas -Gonorrhea and chlamydia collected -Given patient is getting ready to start her period this may be physiologic discharge.  Will not treat at this time but strict return precautions given.

## 2020-08-09 ENCOUNTER — Telehealth: Payer: Self-pay

## 2020-08-09 NOTE — Telephone Encounter (Signed)
Patient calls nurse line stating she was seen ~ 1 week ago for vaginal discharge, however was not treated at that time. Patient reports she was told to call back if symptoms persisted after her period. Patient reports thick white discharge that is "irritating," however denies any odor. Patient requesting metro gel at this time. Please advise. Will forward to provider who saw patient.

## 2020-08-10 MED ORDER — METRONIDAZOLE 0.75 % VA GEL: 1.0000 | Freq: Every day | VAGINAL | 0 refills | Status: AC

## 2020-08-10 NOTE — Telephone Encounter (Signed)
Called patient regarding symptoms.  Reported that her symptoms are consistent with bacterial vaginosis.  Bacteria was present on wet prep but no clue cells identified.  Discussed with patient at her visit and we talked about if the symptoms did not resolve she may need treatment.  I am sending metronidazole gel for 5 days to her pharmacy.  Return precautions given if the symptoms do not resolve she will need to be seen again.

## 2020-08-16 ENCOUNTER — Ambulatory Visit: Payer: Medicaid Other

## 2020-08-16 DIAGNOSIS — Z03818 Encounter for observation for suspected exposure to other biological agents ruled out: Secondary | ICD-10-CM | POA: Diagnosis not present

## 2020-08-25 ENCOUNTER — Other Ambulatory Visit: Payer: Self-pay

## 2020-08-25 ENCOUNTER — Ambulatory Visit (INDEPENDENT_AMBULATORY_CARE_PROVIDER_SITE_OTHER): Payer: Medicaid Other

## 2020-08-25 VITALS — BP 110/78 | HR 86

## 2020-08-25 DIAGNOSIS — Z111 Encounter for screening for respiratory tuberculosis: Secondary | ICD-10-CM | POA: Diagnosis present

## 2020-08-25 DIAGNOSIS — Z23 Encounter for immunization: Secondary | ICD-10-CM | POA: Diagnosis not present

## 2020-08-25 NOTE — Progress Notes (Signed)
Patient presents to nurse clinic for PPD placement and yearly flu vaccination. PPD placed in left forearm at 0920, site unremarkable. Administered flu vaccine in RD, site unremarkable, tolerated injection well.   When scheduling patient for PPD read, patient reports having burning sensation in chest. Patient would like to schedule PPD read with provider to address this concern. Patient states that this has been going on for over two weeks, has tried multiple OTC agents with little relief. Patient describes feeling "like someone threw a match down my throat". Precepted with Dr. Myriam Jacobson, who agreed that patient should be evaluated for concern. Scheduled patient in ATC on Friday morning for further evaluation. Provided with strict return/ ED precautions. Obtained VS. VS WNL.   Nancy Prude, RN

## 2020-08-27 ENCOUNTER — Other Ambulatory Visit: Payer: Self-pay

## 2020-08-27 ENCOUNTER — Ambulatory Visit (INDEPENDENT_AMBULATORY_CARE_PROVIDER_SITE_OTHER): Payer: Medicaid Other | Admitting: Family Medicine

## 2020-08-27 DIAGNOSIS — K219 Gastro-esophageal reflux disease without esophagitis: Secondary | ICD-10-CM | POA: Diagnosis not present

## 2020-08-27 MED ORDER — FAMOTIDINE 20 MG PO TABS: 20.0000 mg | ORAL_TABLET | Freq: Two times a day (BID) | ORAL | 0 refills | Status: DC

## 2020-08-27 NOTE — Assessment & Plan Note (Signed)
Assessment:  34 y.o. female with heartburn for a few months which has increased significantly over the past 3 weeks.  Patient states that in the past she had had heartburn during pregnancy but this was many years ago.  She has tried Tums and several at home treatment options such as staying upright for an hour before bedtime after her last meal to no avail.  No red flag symptoms.  Patient states there is no chance of pregnancy at this time as she is in a same-sex relationship and has not had sex with a female partner in the recent past.   Plan: -Discussed with the patient that there are several treatment modalities that we can try.  We will send in the patient famotidine to try.  She is going to initially start with 20 mg tablet per day and increase this to 20 mg twice per day if her symptoms do not resolve in a day or 2.  If she continues having symptoms at 20 mg twice per day after 3 to 4 days of treatment she will send me a MyChart message or let me know and we can consider a PPI at that point. -Discussed return precautions.

## 2020-08-27 NOTE — Progress Notes (Signed)
    SUBJECTIVE:   CHIEF COMPLAINT / HPI:   Heartburn: Patient is a pleasant 34 year old female that presents today to discuss heartburn.  She states this is been going on for a few months but got worse about 3 weeks ago. Bothers her every day, seems to be worse at night. Has tried to reduce acidic foods, has tried tums before meals, drinking more milk, and none of these things have improved her symptoms. Doesn't bother her as much during the morning, just on occasion after breakfast but is daily with her later meals. Last meal is before 8pm and goes to bed around 9:30pm. No black stools, blood in stools, no dysphagia, does sometimes throw up at night due to her symptoms over the past week.  Patient states that she has no chance of being pregnant as she is in a same-sex relationship and has no concern for pregnancy test at this time.  PERTINENT  PMH / PSH: None relevant  OBJECTIVE:   BP 108/70   Pulse 76   Wt 229 lb 3.2 oz (104 kg)   LMP 08/14/2020 (Approximate)   SpO2 96%   BMI 39.34 kg/m    General: NAD, pleasant, able to participate in exam Chest/cardiac: RRR, no murmurs.  No pain with palpation to the chest wall Respiratory: CTAB, normal effort, No wheezes, rales or rhonchi Abdomen: Bowel sounds present, nontender Psych: Normal affect and mood  ASSESSMENT/PLAN:   GERD (gastroesophageal reflux disease) Assessment:  34 y.o. female with heartburn for a few months which has increased significantly over the past 3 weeks.  Patient states that in the past she had had heartburn during pregnancy but this was many years ago.  She has tried Tums and several at home treatment options such as staying upright for an hour before bedtime after her last meal to no avail.  No red flag symptoms.  Patient states there is no chance of pregnancy at this time as she is in a same-sex relationship and has not had sex with a female partner in the recent past.   Plan: -Discussed with the patient that there  are several treatment modalities that we can try.  We will send in the patient famotidine to try.  She is going to initially start with 20 mg tablet per day and increase this to 20 mg twice per day if her symptoms do not resolve in a day or 2.  If she continues having symptoms at 20 mg twice per day after 3 to 4 days of treatment she will send me a MyChart message or let me know and we can consider a PPI at that point. -Discussed return precautions.   Jackelyn Poling, DO [facility]    This note was prepared using Dragon voice recognition software and may include unintentional dictation errors due to the inherent limitations of voice recognition software.

## 2020-08-27 NOTE — Progress Notes (Signed)
Patient here today to have PPD site read.   PPD read and results entered in EpicCare. Result: 0 mm induration. Interpretation: Negative Goldie Tregoning T Davion Flannery, CMA          

## 2020-08-27 NOTE — Patient Instructions (Signed)
It was great to see you!  Our plans for today:  -Today we discussed your reflux symptoms.  I am sending in a medication called famotidine to your pharmacy.  On this medication it will say take 1 pill twice per day.  I would like for you to start off taking 1 pill once per day.  You can take this a few hours before you tend to have your most symptoms, so sometime in the late afternoon is what I would recommend.  Try this for 1 to 2 days.  If this is not making improvement on your symptoms I would like for you to start taking 1 pill twice per day, once in the morning and once in the evening.  If this does not make an improvement in your symptoms in 2 or 3 days please send me a MyChart message or call and leave me a message at the clinic and I will send in a different medication to try. -If you develop any black stools, blood in your stool, significant vomiting, or other concerning symptoms please let us know. -Follow-up as needed.   Take care and seek immediate care sooner if you develop any concerns.   Dr. [facility]

## 2020-09-06 NOTE — Progress Notes (Signed)
° ° ° °  SUBJECTIVE:   CHIEF COMPLAINT / HPI:   Nancy Young is a 34 y.o. female presents with left knee pain  Knee pain  Patient reports aching anterior left knee pain which started a few years ago and worsened 3 weeks ago. Onset was gradual. Denies radiation. Is not alleviated by anything . Exacerbated by going up and down the steps, bending and laying down. Severity  8/10. Associated with mild edema. Denies fevers or rashes. Has tried ice, tylenol, ibuprofen and knee braces. Was seen one year ago in clinic for bilateral knee pain. OA was suspected and knee xrays and MRI were ordered however pt did not go to scan. Last knee xray in  2019 which showed no signs of OA. Has had a steroid injection in Feb which helped with the pain. Denies trauma.   PERTINENT  PMH / PSH: GERD, obesity, carpal tunnel syndrome   OBJECTIVE:   BP 118/68    Pulse 86    Ht 5\' 3"  (1.6 m)    Wt 229 lb (103.9 kg)    LMP 08/14/2020 (Approximate)    SpO2 95%    BMI 40.57 kg/m    General: Alert, no acute distress, well appearing  Cardio: Normal S1 and S2, RRR, no r/m/g Pulm: CTAB, normal work of breathing Abdomen: Bowel sounds normal. Abdomen soft and non-tender.  Extremities: No peripheral edema.  Knee: Mild edema of left knee, no obvious erythema, not warm on palpation, tenderness on palpation of left anterior knee joint, negative anterior, posterior drawer and mc-murrays test Neuro: Cranial nerves grossly intact   ASSESSMENT/PLAN:   Left knee pain Symptoms likely consistent with osteoarthritis. Pt has BMI>40. Cannot rule out soft tissue injury such as ligament/meniscal tears. Low suspicion for septic arthritis as history is chronic and would expect more edema, tenderness and warm knee joint. Pt has had multiple clinic visits over the past few years for knee pain. Xray in 2019 showed mild degenerative changes. Xrays and MRI knee ordered earlier this year which was approved by insurance however patient did not  show for these appointments. Administered 40mg  Depo-Medrol into joint space today.  -Continue ice therapy,  nsaids and tylenol -MRI of left knee (after insurance approval, clinic will contact patient once it has been) -Follow up with PCP after MRI knee  -Weight loss counseling provided     2020, MD  PGY2 [facility]

## 2020-09-07 ENCOUNTER — Other Ambulatory Visit: Payer: Self-pay

## 2020-09-07 ENCOUNTER — Ambulatory Visit (INDEPENDENT_AMBULATORY_CARE_PROVIDER_SITE_OTHER): Payer: Medicaid Other | Admitting: Family Medicine

## 2020-09-07 VITALS — BP 118/68 | HR 86 | Ht 63.0 in | Wt 229.0 lb

## 2020-09-07 DIAGNOSIS — G8929 Other chronic pain: Secondary | ICD-10-CM

## 2020-09-07 DIAGNOSIS — M25562 Pain in left knee: Secondary | ICD-10-CM

## 2020-09-07 MED ORDER — METHYLPREDNISOLONE ACETATE 40 MG/ML IJ SUSP
40.0000 mg | Freq: Once | INTRAMUSCULAR | Status: AC
  Administered 2020-09-07: 40 mg via INTRAMUSCULAR

## 2020-09-07 NOTE — Assessment & Plan Note (Signed)
Symptoms likely consistent with osteoarthritis. Pt has BMI>40. Cannot rule out soft tissue injury such as ligament/meniscal tears. Low suspicion for septic arthritis as history is chronic and would expect more edema, tenderness and warm knee joint. Pt has had multiple clinic visits over the past few years for knee pain. Xray in 2019 showed mild degenerative changes. Xrays and MRI knee ordered earlier this year which was approved by insurance however patient did not show for these appointments. Administered 40mg  Depo-Medrol into joint space today.  -Continue ice therapy,  nsaids and tylenol -MRI of left knee (after insurance approval, clinic will contact patient once it has been) -Follow up with PCP after MRI knee  -Weight loss counseling provided

## 2020-09-07 NOTE — Patient Instructions (Addendum)
Thank you for coming to see me today. It was a pleasure. You likely have arthritis of the knee. It could be soft tissue damage too, an MRI will be good at showing soft tissue damage. You should go to have the MRI after it has been approved by your insurance. You may to have wait a week or so.  Please follow up with me after you have the results.  If you have any questions or concerns, please do not hesitate to call the office at [phone].  If you develop fevers>100.5, shortness of breath, chest pain, palpitations, dizziness, abdominal pain, nausea, vomiting, diarrhea or cannot eat or drink then please go to the ER immediately.  Best wishes,   Dr Allena Katz

## 2020-09-15 ENCOUNTER — Ambulatory Visit (HOSPITAL_COMMUNITY)
Admission: EM | Admit: 2020-09-15 | Discharge: 2020-09-15 | Disposition: A | Discharge status: Home / Self Care | Payer: Medicaid Other | Attending: Internal Medicine | Admitting: Internal Medicine

## 2020-09-15 ENCOUNTER — Telehealth (HOSPITAL_COMMUNITY): Payer: Self-pay

## 2020-09-15 ENCOUNTER — Encounter (HOSPITAL_COMMUNITY): Payer: Self-pay

## 2020-09-15 ENCOUNTER — Other Ambulatory Visit: Payer: Self-pay

## 2020-09-15 DIAGNOSIS — Z20822 Contact with and (suspected) exposure to covid-19: Secondary | ICD-10-CM | POA: Diagnosis not present

## 2020-09-15 DIAGNOSIS — J029 Acute pharyngitis, unspecified: Secondary | ICD-10-CM | POA: Insufficient documentation

## 2020-09-15 LAB — POCT RAPID STREP A, ED / UC: Streptococcus, Group A Screen (Direct): NEGATIVE

## 2020-09-15 LAB — SARS CORONAVIRUS 2 (TAT 6-24 HRS): SARS Coronavirus 2: NEGATIVE

## 2020-09-15 MED ORDER — AMOXICILLIN 500 MG PO CAPS: 500.0000 mg | ORAL_CAPSULE | Freq: Two times a day (BID) | ORAL | 0 refills | Status: DC

## 2020-09-15 MED ORDER — AMOXICILLIN 500 MG PO CAPS: 500.0000 mg | ORAL_CAPSULE | Freq: Two times a day (BID) | ORAL | 0 refills | Status: AC

## 2020-09-15 NOTE — Telephone Encounter (Signed)
Pt phoned and stated that [facility] did not have Rx ready. Called pharmacy and gave verbal fill information per L. Katrinka Blazing NP, received by pharmacist, Christ Kick pt to call pharmacy before attempting to pick up Rx.

## 2020-09-15 NOTE — Discharge Instructions (Signed)
Your rapid strep test today is negative.  It has been sent for culture so we will call and let you know if you need to take antibiotic.  In the meantime plenty of fluids, warm beverages, lozenges, gargling for salt water.  You can take Motrin or Tylenol as needed for discomfort.  I am also screening you for Covid.  Isolation precautions as discussed.  Your results should be available on MyChart in the next 2 to 3 days.

## 2020-09-15 NOTE — ED Provider Notes (Signed)
[facility]    CSN: [id number] Arrival date & time: 09/15/20  9826      History   Chief Complaint Chief Complaint  Patient presents with  . Sore Throat    HPI Nancy Young is a 34 y.o. female otherwise healthy presents to urgent care today with 4-day history of sore throat.  Patient describes pain as severe with difficulty eating or drinking due to pain.  She denies any recent fever or chills, no N/V, abdominal pain, cough, SOB or rhinorrhea, no loss of smell or taste.  Patient is fully vaccinated.  She works as a Water quality scientist at [employer].  Patient has taken OTC spray and Tylenol without any relief.   Past Medical History:  Diagnosis Date  . BV (bacterial vaginosis)   . Obesity   . Vaginal discharge 07/27/2017  . Yeast infection     Patient Active Problem List   Diagnosis Date Noted  . GERD (gastroesophageal reflux disease) 08/27/2020  . Abdominal pain in female 05/17/2020  . Left knee pain 12/02/2019  . Bilateral knee pain 09/23/2019  . Strain of left foot 07/17/2019  . Urinary frequency May 25, 2019  . Sudden death of family member 25-Feb-2019  . Carpal tunnel syndrome 12/31/2018  . Vaginal discharge 05/14/2018  . Health care maintenance 07/02/2017  . Obesity 05/22/2017  . STD exposure 05/22/2017    Past Surgical History:  Procedure Laterality Date  . LEEP  2013    OB History   No obstetric history on file.      Home Medications    Prior to Admission medications   Medication Sig Start Date End Date Taking? Authorizing Provider  famotidine (PEPCID) 20 MG tablet Take 1 tablet (20 mg total) by mouth 2 (two) times daily. 08/27/20  Yes Jackelyn Poling, DO  acetaminophen (TYLENOL) 500 MG tablet Take 1,000 mg by mouth every 6 (six) hours as needed for mild pain or headache.    [provider]  amoxicillin (AMOXIL) 500 MG capsule Take 1 capsule (500 mg total) by mouth 2 (two) times daily for 10 days. 09/15/20 09/25/20  Rolla Etienne, NP  HYDROcodone-acetaminophen (NORCO/VICODIN) 5-325 MG tablet Take 1-2 tablets by mouth every 6 (six) hours as needed for moderate pain or severe pain. 07/12/19   Domenick Gong, MD  ibuprofen (ADVIL) 600 MG tablet Take 1 tablet (600 mg total) by mouth every 6 (six) hours as needed. 07/12/19   Domenick Gong, MD  naproxen (NAPROSYN) 500 MG tablet Take 1 tablet (500 mg total) by mouth 2 (two) times daily with a meal. 09/23/19   Rumball, Darl Householder, DO  terconazole (TERAZOL 3) 0.8 % vaginal cream Place 1 applicator vaginally at bedtime. 08/25/19   Carney Living, MD    Family History Family History  Problem Relation Age of Onset  . Diabetes Mother   . Hypertension Mother   . Hyperlipidemia Mother   . Bipolar disorder Mother   . Diabetes Maternal Grandmother   . Sickle cell anemia Maternal Grandmother   . Heart attack Maternal Grandmother   . Anxiety disorder Maternal Grandmother   . Hypertension Maternal Grandmother   . Hyperlipidemia Maternal Grandmother   . Stroke Maternal Grandmother   . Hepatitis Maternal Grandfather   . Lung disease Maternal Grandfather   . Diabetes Paternal Grandmother   . Hypertension Paternal Grandmother   . Hyperlipidemia Paternal Grandmother   . Kidney disease Maternal Aunt   . Diabetes Maternal Aunt   . Hypertension Maternal Aunt   .  Stomach cancer Other     Social History Social History   Tobacco Use  . Smoking status: Never Smoker  . Smokeless tobacco: Never Used  Vaping Use  . Vaping Use: Never used  Substance Use Topics  . Alcohol use: Yes    Comment: 1-2 drinks per 2-3 times/week  . Drug use: No     Allergies   Patient has no known allergies.   Review of Systems As stated in HPI  otherwise negative   Physical Exam Triage Vital Signs ED Triage Vitals  Enc Vitals Group     BP 09/15/20 0826 124/60     Pulse Rate 09/15/20 0826 76     Resp 09/15/20 0826 18     Temp 09/15/20 0826 98.6 F (37 C)     Temp Source  09/15/20 0826 Oral     SpO2 09/15/20 0826 100 %     Weight --      Height --      Head Circumference --      Peak Flow --      Pain Score 09/15/20 0823 8     Pain Loc --      Pain Edu? --      Excl. in GC? --    No data found.  Updated Vital Signs BP 124/60 (BP Location: Left Arm)   Pulse 76   Temp 98.6 F (37 C) (Oral)   Resp 18   LMP 08/19/2020   SpO2 100%   Visual Acuity Right Eye Distance:   Left Eye Distance:   Bilateral Distance:    Right Eye Near:   Left Eye Near:    Bilateral Near:     Physical Exam Constitutional:      Appearance: She is well-developed.     Comments: Tearful and unwell appearing though does not appear in any acute distress  HENT:     Right Ear: Tympanic membrane normal.     Left Ear: Tympanic membrane normal.     Nose: No congestion or rhinorrhea.     Mouth/Throat:     Mouth: Mucous membranes are moist.     Pharynx: Uvula midline. Posterior oropharyngeal erythema present. No pharyngeal swelling or oropharyngeal exudate.  Pulmonary:     Effort: Pulmonary effort is normal.     Breath sounds: Normal breath sounds.  Musculoskeletal:     Cervical back: Normal range of motion.  Lymphadenopathy:     Cervical: Cervical adenopathy present.  Skin:    General: Skin is warm and dry.  Neurological:     Mental Status: She is alert.  Psychiatric:        Mood and Affect: Mood normal.        Behavior: Behavior normal.      UC Treatments / Results  Labs (all labs ordered are listed, but only abnormal results are displayed) Labs Reviewed  CULTURE, GROUP A STREP ([facility])  SARS CORONAVIRUS 2 (TAT 6-24 HRS)  POCT RAPID STREP A, ED / UC    EKG   Radiology No results found.  Procedures Procedures (including critical care time)  Medications Ordered in UC Medications - No data to display  Initial Impression / Assessment and Plan / UC Course  I have reviewed the triage vital signs and the nursing notes.  Pertinent labs & imaging results  that were available during my care of the patient were reviewed by me and considered in my medical decision making (see chart for details).  Pharyngitis -Perhaps viral as rapid  strep is negative.  Given patient appearance and physical exam will prescribe antibiotic if symptoms persist greater than 10 days.  -Discussed symptomatic treatment with salt water gargles, warm liquids, Motrin and/or Tylenol -Patient to start taking antibiotics if symptoms persist more than 10 days -Culture sent -Follow-up for worsening symptoms  Final Clinical Impressions(s) / UC Diagnoses   Final diagnoses:  Sore throat     Discharge Instructions     Your rapid strep test today is negative.  It has been sent for culture so we will call and let you know if you need to take antibiotic.  In the meantime plenty of fluids, warm beverages, lozenges, gargling for salt water.  You can take Motrin or Tylenol as needed for discomfort.  I am also screening you for Covid.  Isolation precautions as discussed.  Your results should be available on MyChart in the next 2 to 3 days.    ED Prescriptions    Medication Sig Dispense Auth. Provider   amoxicillin (AMOXIL) 500 MG capsule Take 1 capsule (500 mg total) by mouth 2 (two) times daily for 10 days. 20 capsule Rolla Etienne, NP     PDMP not reviewed this encounter.   Rolla Etienne, NP 09/15/20 1047

## 2020-09-15 NOTE — ED Triage Notes (Signed)
Pt c/o sore throat x 5 days. Pt states she cannot swallow, she states eating is uncomfortable. Pt denies fever, vomiting, diarrhea, nausea and bodyaches. Pt states she has been feeling fatigued and has taken OTC medicine.

## 2020-09-17 LAB — CULTURE, GROUP A STREP (THRC)

## 2020-09-21 DIAGNOSIS — Z03818 Encounter for observation for suspected exposure to other biological agents ruled out: Secondary | ICD-10-CM | POA: Diagnosis not present

## 2020-09-24 ENCOUNTER — Other Ambulatory Visit: Payer: Self-pay | Admitting: Family Medicine

## 2020-09-24 ENCOUNTER — Encounter: Payer: Self-pay | Admitting: Family Medicine

## 2020-10-03 ENCOUNTER — Other Ambulatory Visit: Payer: Self-pay | Admitting: Family Medicine

## 2020-10-04 ENCOUNTER — Telehealth: Payer: Self-pay | Admitting: *Deleted

## 2020-10-04 MED ORDER — FAMOTIDINE 20 MG PO TABS: 20.0000 mg | ORAL_TABLET | Freq: Two times a day (BID) | ORAL | 0 refills | Status: DC

## 2020-10-04 NOTE — Telephone Encounter (Signed)
-----   Message from Towanda Octave, MD sent at 09/28/2020 12:03 PM EST ----- Regarding: knee mri Hi team,   Are you able to find out whether this patient's MRI knee has been approved yet? She had sent me a MyChart message asking for it.  Thank you Poonam

## 2020-10-04 NOTE — Telephone Encounter (Signed)
Contacted insurance and received Auth# for MRI.  Information given to Shanda Bumps to add to referral information. Will call to schedule and will notify pt of date and time. Sheresa Cullop Zimmerman Rumple, CMA

## 2020-10-04 NOTE — Telephone Encounter (Signed)
Contacted pt to give her the MRI appt information and she was at work and informed her that I would send her a [employer] with the information.Nancy Young, CMA

## 2020-10-07 ENCOUNTER — Other Ambulatory Visit: Payer: Self-pay

## 2020-10-07 ENCOUNTER — Telehealth: Payer: Self-pay | Admitting: Family Medicine

## 2020-10-07 ENCOUNTER — Ambulatory Visit (INDEPENDENT_AMBULATORY_CARE_PROVIDER_SITE_OTHER): Payer: Medicaid Other | Admitting: Family Medicine

## 2020-10-07 ENCOUNTER — Encounter: Payer: Self-pay | Admitting: Family Medicine

## 2020-10-07 VITALS — BP 116/66 | HR 93 | Wt 230.0 lb

## 2020-10-07 DIAGNOSIS — N898 Other specified noninflammatory disorders of vagina: Secondary | ICD-10-CM | POA: Diagnosis not present

## 2020-10-07 DIAGNOSIS — R350 Frequency of micturition: Secondary | ICD-10-CM | POA: Diagnosis not present

## 2020-10-07 DIAGNOSIS — R7303 Prediabetes: Secondary | ICD-10-CM | POA: Diagnosis not present

## 2020-10-07 LAB — POCT WET PREP (WET MOUNT)
Clue Cells Wet Prep Whiff POC: POSITIVE
Trichomonas Wet Prep HPF POC: ABSENT

## 2020-10-07 LAB — POCT GLYCOSYLATED HEMOGLOBIN (HGB A1C): Hemoglobin A1C: 5.8 % — AB (ref 4.0–5.6)

## 2020-10-07 LAB — POCT URINALYSIS DIP (MANUAL ENTRY)
Bilirubin, UA: NEGATIVE
Blood, UA: NEGATIVE
Glucose, UA: NEGATIVE mg/dL
Leukocytes, UA: NEGATIVE
Nitrite, UA: NEGATIVE
Protein Ur, POC: NEGATIVE mg/dL
Spec Grav, UA: >=1.03 — AB (ref 1.010–1.025)
Urobilinogen, UA: 0.2 E.U./dL
pH, UA: 5.5 (ref 5.0–8.0)

## 2020-10-07 MED ORDER — METRONIDAZOLE 0.75 % VA GEL
1.0000 | Freq: Every day | VAGINAL | 0 refills | Status: AC
Start: 2020-10-07 — End: 2020-10-12

## 2020-10-07 MED ORDER — FLUCONAZOLE 150 MG PO TABS: 150.0000 mg | ORAL_TABLET | Freq: Once | ORAL | 0 refills | Status: AC

## 2020-10-07 NOTE — Telephone Encounter (Signed)
Discussed lab results with patient.  A1c still within prediabetes range.  UA unremarkable.  Wet prep positive for BV and yeast.  Will send in 5 days of metronidazole gel (patient prefers gel over pill) as well as fluconazole 1 dose to take after completing course of metronidazole.

## 2020-10-07 NOTE — Assessment & Plan Note (Signed)
Malodorous vaginal discharge that started this past week. History of frequent BV infections per patient. - wet prep

## 2020-10-07 NOTE — Assessment & Plan Note (Signed)
Urinary frequency for 2 weeks without dysuria. Will test for UTI. Also having new episodes of nocturnal enuresis. - UA - urine culture

## 2020-10-07 NOTE — Patient Instructions (Addendum)
It was nice seeing you today!  I will update you with the test results as soon as they are available.  Stay well, Littie Deeds, MD [facility] [phone]

## 2020-10-07 NOTE — Progress Notes (Signed)
    SUBJECTIVE:   CHIEF COMPLAINT / HPI:   Urinary frequency Reports urinary frequency for 2 weeks.  For the past week, she has had 3 episodes of nocturnal enuresis which is new for her - she is concerned about diabetes as this runs in her family, most recent HbA1c 6.1 within pre-diabetes range She is requesting to re-test her A1c Denies dysuria. Has not had a UTI in several years, does not feel like this is a UTI. Reports polyphagia and fatigue Denies polydipsia  Vaginal discharge Started having it a few days ago. Thick, milky liquid. Feels urine is a little cloudy. Malodorous, hard to describe odor Reports history of frequent BV infections, wants to be tested for this Been with same partner for 6 years, does not want GC/CT testing  PERTINENT  PMH / PSH: Obesity  OBJECTIVE:   BP 116/66   Pulse 93   Wt 230 lb (104.3 kg)   LMP 09/17/2020   SpO2 97%   BMI 40.74 kg/m   General: Obese young female, NAD  ASSESSMENT/PLAN:   Vaginal discharge Malodorous vaginal discharge that started this past week. History of frequent BV infections per patient. - wet prep  Urinary frequency Urinary frequency for 2 weeks without dysuria. Will test for UTI. Also having new episodes of nocturnal enuresis. - UA - urine culture  Prediabetes Most recent A1c 4 months ago within pre-diabetes range. Now having symptoms of urinary frequency, fatigue, and polyphagia. New episodes of nocturnal enuresis, she is concerned about diabetes. - repeat HbA1c today     Littie Deeds, MD [facility]

## 2020-10-07 NOTE — Assessment & Plan Note (Signed)
Most recent A1c 4 months ago within pre-diabetes range. Now having symptoms of urinary frequency, fatigue, and polyphagia. New episodes of nocturnal enuresis, she is concerned about diabetes. - repeat HbA1c today

## 2020-10-09 LAB — URINE CULTURE: Organism ID, Bacteria: NO GROWTH

## 2020-10-12 ENCOUNTER — Ambulatory Visit (HOSPITAL_COMMUNITY)
Admission: RE | Admit: 2020-10-12 | Discharge: 2020-10-12 | Disposition: A | Discharge status: Home / Self Care | Payer: Medicaid Other | Source: Ambulatory Visit | Attending: Family Medicine | Admitting: Family Medicine

## 2020-10-12 ENCOUNTER — Other Ambulatory Visit: Payer: Self-pay

## 2020-10-12 DIAGNOSIS — M25562 Pain in left knee: Secondary | ICD-10-CM | POA: Diagnosis not present

## 2020-10-12 DIAGNOSIS — M25561 Pain in right knee: Secondary | ICD-10-CM | POA: Diagnosis not present

## 2020-10-12 DIAGNOSIS — G8929 Other chronic pain: Secondary | ICD-10-CM | POA: Diagnosis present

## 2020-10-12 DIAGNOSIS — M23242 Derangement of anterior horn of lateral meniscus due to old tear or injury, left knee: Secondary | ICD-10-CM | POA: Diagnosis not present

## 2020-10-13 ENCOUNTER — Encounter: Payer: Self-pay | Admitting: Family Medicine

## 2020-10-13 ENCOUNTER — Telehealth: Payer: Self-pay

## 2020-10-13 NOTE — Telephone Encounter (Signed)
Patient calls nurse line requesting returned phone call regarding imaging results from yesterday.   Routing to Dr. Wynelle Link and Dr. Allena Katz.   Please advise  Veronda Prude, RN

## 2020-10-14 ENCOUNTER — Other Ambulatory Visit: Payer: Self-pay | Admitting: Family Medicine

## 2020-10-14 DIAGNOSIS — S83282A Other tear of lateral meniscus, current injury, left knee, initial encounter: Secondary | ICD-10-CM

## 2020-10-14 NOTE — Telephone Encounter (Signed)
Pt contacted me via mychart so I have replied to her regarding MRI knee results. Thanks [employer].

## 2020-10-26 DIAGNOSIS — Z03818 Encounter for observation for suspected exposure to other biological agents ruled out: Secondary | ICD-10-CM | POA: Diagnosis not present

## 2020-10-29 ENCOUNTER — Encounter: Payer: Self-pay | Admitting: Orthopaedic Surgery

## 2020-10-29 ENCOUNTER — Other Ambulatory Visit: Payer: Self-pay

## 2020-10-29 ENCOUNTER — Ambulatory Visit (INDEPENDENT_AMBULATORY_CARE_PROVIDER_SITE_OTHER): Payer: Medicaid Other | Admitting: Orthopaedic Surgery

## 2020-10-29 DIAGNOSIS — S83282A Other tear of lateral meniscus, current injury, left knee, initial encounter: Secondary | ICD-10-CM | POA: Diagnosis not present

## 2020-10-29 DIAGNOSIS — M23007 Cystic meniscus, unspecified meniscus, left knee: Secondary | ICD-10-CM

## 2020-10-29 NOTE — Progress Notes (Signed)
Office Visit Note   Patient: Nancy Young           Date of Birth: 1986/09/14           MRN: [id number] Visit Date: 10/29/2020              Requested by: [place] [id number] [place],  [place] [address] PCP: Towanda Octave, MD   Assessment & Plan: Visit Diagnoses:  1. Acute lateral meniscus tear of left knee, initial encounter   2. Perimeniscal cyst of left knee     Plan: Impression is acute lateral meniscus tear and parameniscal cyst and patellofemoral pain.  I think the meniscal cyst and tear are certainly symptomatic and I have recommended arthroscopic debridement of the tear as well as the cyst.  Details of the surgery including risk benefits rehab recovery all reviewed with the patient.  I do feel that component of her pain stems from being overweight and quadriceps weakness.  For this I have recommended outpatient physical therapy and weight loss.  I think the best thing to do is to do the arthroscopic surgery first and then place her in outpatient physical therapy.  She will continue to work on weight loss.  Questions encouraged and answered.  Total face to face encounter time was greater than 45 minutes and over half of this time was spent in counseling and/or coordination of care.  Follow-Up Instructions: Return for 10 day postop visit.   Orders:  No orders of the defined types were placed in this encounter.  No orders of the defined types were placed in this encounter.     Procedures: No procedures performed   Clinical Data: No additional findings.   Subjective: Chief Complaint  Patient presents with  . Left Knee - Pain    She Nancy Young is a 35 year old female here for evaluation of left knee pain for couple years.  She describes pain on both sides of the patella and on the lateral joint line.  She also describes increased pain with going up and down steps.  She has some pain that radiates down the leg with increased activity.  She  has so far used Tylenol and a knee brace and has seen her primary care doctor for conservative treatment and she has not found much relief.  She works as a Water quality scientist at [employer].  She denies any mechanical symptoms.   Review of Systems  Constitutional: Negative.   HENT: Negative.   Eyes: Negative.   Respiratory: Negative.   Cardiovascular: Negative.   Endocrine: Negative.   Musculoskeletal: Negative.   Neurological: Negative.   Hematological: Negative.   Psychiatric/Behavioral: Negative.   All other systems reviewed and are negative.    Objective: Vital Signs: There were no vitals taken for this visit.  Physical Exam Vitals and nursing note reviewed.  Constitutional:      Appearance: She is well-developed and well-nourished.  HENT:     Head: Normocephalic and atraumatic.  Eyes:     Extraocular Movements: EOM normal.  Pulmonary:     Effort: Pulmonary effort is normal.  Abdominal:     Palpations: Abdomen is soft.  Musculoskeletal:     Cervical back: Neck supple.  Skin:    General: Skin is warm.     Capillary Refill: Capillary refill takes less than 2 seconds.  Neurological:     Mental Status: She is alert and oriented to person, place, and time.  Psychiatric:  Mood and Affect: Mood and affect normal.        Behavior: Behavior normal.        Thought Content: Thought content normal.        Judgment: Judgment normal.     Ortho Exam Examination of the left knee shows trace effusion.  She has moderate pain with range of motion which is well-preserved.  Minimal patellofemoral crepitus.  She has a significant lateral joint line tenderness along where the lateral meniscus tear and the parameniscal cyst are based on the MRI.  Collaterals and cruciates are stable.  Negative McMurray. Specialty Comments:  No specialty comments available.  Imaging: No results found.   PMFS History: Patient Active Problem List   Diagnosis Date Noted  . Acute lateral  meniscus tear of left knee 10/29/2020  . Perimeniscal cyst of left knee 10/29/2020  . Prediabetes 10/07/2020  . GERD (gastroesophageal reflux disease) 08/27/2020  . Abdominal pain in female 05/17/2020  . Left knee pain 12/02/2019  . Bilateral knee pain 09/23/2019  . Strain of left foot 07/17/2019  . Urinary frequency 2019/06/11  . Sudden death of family member Mar 14, 2019  . Carpal tunnel syndrome 12/31/2018  . Vaginal discharge 05/14/2018  . Health care maintenance 07/02/2017  . Obesity 05/22/2017  . STD exposure 05/22/2017   Past Medical History:  Diagnosis Date  . BV (bacterial vaginosis)   . Obesity   . Vaginal discharge 07/27/2017  . Yeast infection     Family History  Problem Relation Age of Onset  . Diabetes Mother   . Hypertension Mother   . Hyperlipidemia Mother   . Bipolar disorder Mother   . Diabetes Maternal Grandmother   . Sickle cell anemia Maternal Grandmother   . Heart attack Maternal Grandmother   . Anxiety disorder Maternal Grandmother   . Hypertension Maternal Grandmother   . Hyperlipidemia Maternal Grandmother   . Stroke Maternal Grandmother   . Hepatitis Maternal Grandfather   . Lung disease Maternal Grandfather   . Diabetes Paternal Grandmother   . Hypertension Paternal Grandmother   . Hyperlipidemia Paternal Grandmother   . Kidney disease Maternal Aunt   . Diabetes Maternal Aunt   . Hypertension Maternal Aunt   . Stomach cancer Other     Past Surgical History:  Procedure Laterality Date  . LEEP  2013   Social History   Occupational History  . Occupation: CNA  Tobacco Use  . Smoking status: Never Smoker  . Smokeless tobacco: Never Used  Vaping Use  . Vaping Use: Never used  Substance and Sexual Activity  . Alcohol use: Yes    Comment: 1-2 drinks per 2-3 times/week  . Drug use: No  . Sexual activity: Yes    Partners: Female    Birth control/protection: None

## 2020-11-01 DIAGNOSIS — Z20828 Contact with and (suspected) exposure to other viral communicable diseases: Secondary | ICD-10-CM | POA: Diagnosis not present

## 2020-11-07 DIAGNOSIS — Z20828 Contact with and (suspected) exposure to other viral communicable diseases: Secondary | ICD-10-CM | POA: Diagnosis not present

## 2020-11-16 DIAGNOSIS — Z20828 Contact with and (suspected) exposure to other viral communicable diseases: Secondary | ICD-10-CM | POA: Diagnosis not present

## 2020-11-18 DIAGNOSIS — K219 Gastro-esophageal reflux disease without esophagitis: Secondary | ICD-10-CM | POA: Diagnosis not present

## 2020-11-18 DIAGNOSIS — M25562 Pain in left knee: Secondary | ICD-10-CM | POA: Diagnosis not present

## 2020-11-18 DIAGNOSIS — R635 Abnormal weight gain: Secondary | ICD-10-CM | POA: Diagnosis not present

## 2020-11-22 ENCOUNTER — Other Ambulatory Visit: Payer: Self-pay

## 2020-12-06 DIAGNOSIS — Z20828 Contact with and (suspected) exposure to other viral communicable diseases: Secondary | ICD-10-CM | POA: Diagnosis not present

## 2020-12-15 ENCOUNTER — Other Ambulatory Visit: Payer: Self-pay | Admitting: Family Medicine

## 2020-12-29 ENCOUNTER — Other Ambulatory Visit: Payer: Self-pay | Admitting: Physician Assistant

## 2020-12-29 MED ORDER — HYDROCODONE-ACETAMINOPHEN 5-325 MG PO TABS: 1.0000 | ORAL_TABLET | Freq: Four times a day (QID) | ORAL | 0 refills | Status: DC | PRN

## 2020-12-29 MED ORDER — ONDANSETRON HCL 4 MG PO TABS: 4.0000 mg | ORAL_TABLET | Freq: Three times a day (TID) | ORAL | 0 refills | Status: DC | PRN

## 2020-12-30 ENCOUNTER — Encounter: Payer: Self-pay | Admitting: Orthopaedic Surgery

## 2020-12-30 DIAGNOSIS — M67462 Ganglion, left knee: Secondary | ICD-10-CM | POA: Diagnosis not present

## 2020-12-30 DIAGNOSIS — S83282A Other tear of lateral meniscus, current injury, left knee, initial encounter: Secondary | ICD-10-CM | POA: Diagnosis not present

## 2020-12-30 DIAGNOSIS — X58XXXA Exposure to other specified factors, initial encounter: Secondary | ICD-10-CM | POA: Diagnosis not present

## 2020-12-30 DIAGNOSIS — M659 Synovitis and tenosynovitis, unspecified: Secondary | ICD-10-CM | POA: Diagnosis not present

## 2020-12-30 DIAGNOSIS — G8918 Other acute postprocedural pain: Secondary | ICD-10-CM | POA: Diagnosis not present

## 2020-12-30 DIAGNOSIS — Y999 Unspecified external cause status: Secondary | ICD-10-CM | POA: Diagnosis not present

## 2020-12-30 DIAGNOSIS — M23007 Cystic meniscus, unspecified meniscus, left knee: Secondary | ICD-10-CM | POA: Diagnosis not present

## 2020-12-30 DIAGNOSIS — S83262A Peripheral tear of lateral meniscus, current injury, left knee, initial encounter: Secondary | ICD-10-CM | POA: Diagnosis not present

## 2020-12-30 HISTORY — PX: KNEE ARTHROSCOPY: SUR90

## 2021-01-06 ENCOUNTER — Encounter: Payer: Self-pay | Admitting: Orthopaedic Surgery

## 2021-01-06 ENCOUNTER — Other Ambulatory Visit: Payer: Self-pay

## 2021-01-06 ENCOUNTER — Ambulatory Visit (INDEPENDENT_AMBULATORY_CARE_PROVIDER_SITE_OTHER): Payer: Medicaid Other | Admitting: Physician Assistant

## 2021-01-06 DIAGNOSIS — Z9889 Other specified postprocedural states: Secondary | ICD-10-CM

## 2021-01-06 MED ORDER — HYDROCODONE-ACETAMINOPHEN 5-325 MG PO TABS: 1.0000 | ORAL_TABLET | Freq: Three times a day (TID) | ORAL | 0 refills | Status: DC | PRN

## 2021-01-06 MED ORDER — IBUPROFEN 800 MG PO TABS: 800.0000 mg | ORAL_TABLET | Freq: Three times a day (TID) | ORAL | 0 refills | Status: DC | PRN

## 2021-01-06 NOTE — Progress Notes (Signed)
Post-Op Visit Note   Patient: Nancy Young           Date of Birth: 01-Nov-1985           MRN: [id number] Visit Date: 01/06/2021 PCP: Towanda Octave, MD   Assessment & Plan:  Chief Complaint:  Chief Complaint  Patient presents with  . Left Knee - Pain   Visit Diagnoses:  1. S/P arthroscopy of left knee     Plan: Patient is a pleasant 35 year old female who comes in today 1 week out left knee arthroscopic debridement lateral meniscus.  She has been doing well.  She has had a moderate amount of pain and is taking Norco.  No fevers or chills.  Examination of the left knee reveals fully healed surgical scars without complication.  She does have mild to moderate tenderness to the lateral portal.  No evidence of infection or cellulitis.  Calf is soft nontender.  She is neurovascular intact distally.  Today, sutures were removed and Steri-Strips applied.  Intraoperative pictures reviewed.  I provided her with a knee range of motion handout which she would also like to attend formal physical therapy.  Internal referral has been made.  I refilled her Norco.  Follow-up with [place] in 5 weeks time for recheck.  Call with concerns or questions.  Follow-Up Instructions: Return in about 5 weeks (around 02/10/2021).   Orders:  Orders Placed This Encounter  Procedures  . Ambulatory referral to Physical Therapy   Meds ordered this encounter  Medications  . ibuprofen (ADVIL) 800 MG tablet    Sig: Take 1 tablet (800 mg total) by mouth every 8 (eight) hours as needed.    Dispense:  30 tablet    Refill:  0  . HYDROcodone-acetaminophen (NORCO) 5-325 MG tablet    Sig: Take 1 tablet by mouth 3 (three) times daily as needed.    Dispense:  30 tablet    Refill:  0    Imaging: No new imaging  PMFS History: Patient Active Problem List   Diagnosis Date Noted  . Acute lateral meniscus tear of left knee 10/29/2020  . Perimeniscal cyst of left knee 10/29/2020  . Prediabetes 10/07/2020  . GERD  (gastroesophageal reflux disease) 08/27/2020  . Abdominal pain in female 05/17/2020  . Left knee pain 12/02/2019  . Bilateral knee pain 09/23/2019  . Strain of left foot 07/17/2019  . Urinary frequency 05/25/2019  . Sudden death of family member 02-25-2019  . Carpal tunnel syndrome 12/31/2018  . Vaginal discharge 05/14/2018  . Health care maintenance 07/02/2017  . Obesity 05/22/2017  . STD exposure 05/22/2017   Past Medical History:  Diagnosis Date  . BV (bacterial vaginosis)   . Obesity   . Vaginal discharge 07/27/2017  . Yeast infection     Family History  Problem Relation Age of Onset  . Diabetes Mother   . Hypertension Mother   . Hyperlipidemia Mother   . Bipolar disorder Mother   . Diabetes Maternal Grandmother   . Sickle cell anemia Maternal Grandmother   . Heart attack Maternal Grandmother   . Anxiety disorder Maternal Grandmother   . Hypertension Maternal Grandmother   . Hyperlipidemia Maternal Grandmother   . Stroke Maternal Grandmother   . Hepatitis Maternal Grandfather   . Lung disease Maternal Grandfather   . Diabetes Paternal Grandmother   . Hypertension Paternal Grandmother   . Hyperlipidemia Paternal Grandmother   . Kidney disease Maternal Aunt   . Diabetes Maternal Aunt   . Hypertension  Maternal Aunt   . Stomach cancer Other     Past Surgical History:  Procedure Laterality Date  . LEEP  2013   Social History   Occupational History  . Occupation: CNA  Tobacco Use  . Smoking status: Never Smoker  . Smokeless tobacco: Never Used  Vaping Use  . Vaping Use: Never used  Substance and Sexual Activity  . Alcohol use: Yes    Comment: 1-2 drinks per 2-3 times/week  . Drug use: No  . Sexual activity: Yes    Partners: Female    Birth control/protection: None

## 2021-01-07 ENCOUNTER — Telehealth: Payer: Self-pay | Admitting: Orthopaedic Surgery

## 2021-01-07 NOTE — Telephone Encounter (Signed)
Yes if she feels comfortable.

## 2021-01-07 NOTE — Telephone Encounter (Signed)
Please advise 

## 2021-01-07 NOTE — Telephone Encounter (Signed)
Patient called asked if she can drive now? The number to contact patient is [phone]

## 2021-01-08 ENCOUNTER — Ambulatory Visit: Payer: Medicaid Other | Attending: Physician Assistant | Admitting: Physical Therapy

## 2021-01-08 ENCOUNTER — Encounter: Payer: Self-pay | Admitting: Physical Therapy

## 2021-01-08 ENCOUNTER — Other Ambulatory Visit: Payer: Self-pay

## 2021-01-08 DIAGNOSIS — G8929 Other chronic pain: Secondary | ICD-10-CM | POA: Insufficient documentation

## 2021-01-08 DIAGNOSIS — M25562 Pain in left knee: Secondary | ICD-10-CM | POA: Insufficient documentation

## 2021-01-08 NOTE — Therapy (Signed)
[facility] [address] [place], [place], [address] Phone: [phone]   Fax:  [phone]  Physical Therapy Evaluation  Patient Details  Name: Nancy Young MRN: [id number] Date of Birth: Jul 20, 1986 Referring Provider (PT): Jari Sportsman PA-C   Encounter Date: 01/08/2021   PT End of Session - 01/08/21 1103    Visit Number 1    Number of Visits 16    Date for PT Re-Evaluation 03/05/21    Authorization Type UHC managed MCD    PT Start Time 1104    PT Stop Time 1144    PT Time Calculation (min) 40 min    Activity Tolerance Patient tolerated treatment well    Behavior During Therapy [facility] for tasks assessed/performed           Past Medical History:  Diagnosis Date  . BV (bacterial vaginosis)   . Obesity   . Vaginal discharge 07/27/2017  . Yeast infection     Past Surgical History:  Procedure Laterality Date  . KNEE ARTHROSCOPY Left 12/30/2020  . LEEP  2013    There were no vitals filed for this visit.    Subjective Assessment - 01/08/21 1109    Subjective pt is a 35 y.o s/p L knee arthroscopay on 12/30/2020 to debridement of the mensicus and cyst. since the surgery she is have good and bad days. pt notes biggest issue is bending the knee and prolonged standing/ walking and laying down specifically with rolling . She denies any popping, locking just noting it is more just stiffness.  pain stays mostly in the front of the knee.    Limitations Standing    How long can you sit comfortably? 60 min    How long can you stand comfortably? 20 min    How long can you walk comfortably? 20 min    Patient Stated Goals get back to work, be able to stand and walk without issues and return to regular.    Currently in Pain? Yes    Pain Score 6    at worst 10/10.   Pain Location Knee    Pain Orientation Left;Lateral    Pain Descriptors / Indicators Constant;Throbbing;Heaviness;Aching    Pain Type Surgical pain    Pain  Onset More than a month ago    Pain Frequency Intermittent    Aggravating Factors  standing/ walking and bending    Pain Relieving Factors elevate, ice packs, medication, rest    Effect of Pain on Daily Activities limited standing/ walking and general ROM              [facility] PT Assessment - 01/08/21 0001      Assessment   Medical Diagnosis S/P arthroscopy of left knee (Y81.448)    Referring Provider (PT) Jari Sportsman PA-C    Onset Date/Surgical Date 12/30/20    Hand Dominance Left   kind ambidextrous   Next MD Visit 02/03/2021    Prior Therapy no      Precautions   Precaution Comments holding of going back to work for 5 weeks      Restrictions   Weight Bearing Restrictions No      Balance Screen   Has the patient fallen in the past 6 months No      Home Environment   Living Environment Private residence    Living Arrangements Spouse/significant other    Available Help at Discharge Family    Type of Home Apartment    Home Access Level entry  Home Layout Two level    Alternate Level Stairs-Number of Steps 15    Alternate Level Stairs-Rails Left   ascending   Home Equipment Walker - 2 wheels;Crutches      Prior Function   Level of Independence Independent    Vocation Full time employment   phlobotomist   Vocation Requirements prlonged walking      Cognition   Overall Cognitive Status Within Functional Limits for tasks assessed      Observation/Other Assessments   Observations incisions appear to be healing well and free from infection    Other Surveys  Lower Extremity Functional Scale    Lower Extremity Functional Scale  13/80      ROM / Strength   AROM / PROM / Strength AROM;Strength;PROM      AROM   AROM Assessment Site Knee    Right/Left Knee Right;Left    Right Knee Extension 0    Right Knee Flexion 130    Left Knee Extension 5    Left Knee Flexion 77      PROM   PROM Assessment Site Knee    Right/Left Knee Left    Left Knee Extension 0    Left  Knee Flexion 87      Strength   Overall Strength Due to pain;Unable to assess    Strength Assessment Site Hip;Knee    Right/Left Hip Right;Left      Ambulation/Gait   Ambulation/Gait Yes                      Objective measurements completed on examination: See above findings.               PT Education - 01/08/21 1144    Education Details evaluation findings, POC, goals, HEP with proper form/ rationale. gait training with heel strike/ toe off    Person(s) Educated Patient    Methods Explanation;Verbal cues;Handout    Comprehension Verbalized understanding;Verbal cues required            PT Short Term Goals - 01/08/21 1149      PT SHORT TERM GOAL #1   Title pt to be IND with inital HEP    Baseline no previous HEP    Time 4    Period Weeks    Status New    Target Date 02/05/21      PT SHORT TERM GOAL #2   Title increase L knee flexion to >/= 100 degrees for therapuetic progression    Baseline see flow sheet    Time 4    Period Weeks    Status New    Target Date 02/05/21             PT Long Term Goals - 01/08/21 1150      PT LONG TERM GOAL #1   Title increase L knee total arc ROM to >/= 2 - 120 degrees for functional ROM required for efficient gait pattern    Baseline no previous HEP    Time 8    Period Weeks    Status New    Target Date 03/05/21      PT LONG TERM GOAL #2   Title increase gross LLE strength to >/= 4+/5 to promote knee stability    Baseline unable to assess MMT due to pain/ irritability    Time 8    Period Weeks    Status New    Target Date 03/05/21      PT LONG  TERM GOAL #3   Title pt to be able to sit, stand and walk for >/= 2 hours for functional endurnace required for work related tasks with </= 2/10 max pain    Baseline sit 60 min, stand and walk 20 min    Time 8    Period Weeks    Status New    Target Date 03/05/21      PT LONG TERM GOAL #4   Title increae LEFS score to >/= 55/80 to demo improvement  in function    Baseline inital score 13/80    Time 8    Period Weeks    Status New    Target Date 03/05/21      PT LONG TERM GOAL #5   Title pt to be IND with all HEP given and is able to maintain and progress current LOF IND    Baseline no previous HEP    Time 8    Period Weeks    Status New    Target Date 03/05/21                  Plan - 01/08/21 1144    Clinical Impression Statement pt is a pleasant 35 y.o F presenting to [facility] s/p L knee arthroscopy on 12/30/2020. she demonstates limited knee AROM with total arc ROM 5 - 77 degrees limited secondary to pain and stiffness. MMT wasn't assessed today due to pain/ irritability. TTP noted periopatellar with palpabale swelling which is expected following surgery. she would benefit from physical therapy to reduce L knee pain, improve ROM, strength, improve gait efficiency and maximize her function by addressing the deficits listed.    Stability/Clinical Decision Making Stable/Uncomplicated    Clinical Decision Making Low    Rehab Potential Good    PT Frequency 2x / week   staring 2 x week for 4 weeks, poentially dropping to 1 x a week   PT Duration 8 weeks    PT Treatment/Interventions ADLs/Self Care Home Management;Cryotherapy;Electrical Stimulation;Iontophoresis 4mg /ml Dexamethasone;Moist Heat;Ultrasound;Gait training;Stair training;Therapeutic activities;Therapeutic exercise;Balance training;Neuromuscular re-education;Patient/family education;Manual techniques;Passive range of motion;Taping;Vasopneumatic Device    PT Next Visit Plan review/ update HEP PRN, knee/ patellar mobs, quad stretching, gross hip /knee strengthening , gait training, Vaso PRN for pain    PT Home Exercise Plan [id number] -  quad set, SLR, heel slides with strap, sidelying hip abduction    Consulted and Agree with Plan of Care Patient           Patient will benefit from skilled therapeutic intervention in order to improve the following deficits and  impairments:  Improper body mechanics,Increased muscle spasms,Decreased strength,Pain,Increased edema,Decreased activity tolerance  Visit Diagnosis: Chronic pain of left knee     Problem List Patient Active Problem List   Diagnosis Date Noted  . Acute lateral meniscus tear of left knee 10/29/2020  . Perimeniscal cyst of left knee 10/29/2020  . Prediabetes 10/07/2020  . GERD (gastroesophageal reflux disease) 08/27/2020  . Abdominal pain in female 05/17/2020  . Left knee pain 12/02/2019  . Bilateral knee pain 09/23/2019  . Strain of left foot 07/17/2019  . Urinary frequency 05-28-19  . Sudden death of family member Feb 28, 2019  . Carpal tunnel syndrome 12/31/2018  . Vaginal discharge 05/14/2018  . Health care maintenance 07/02/2017  . Obesity 05/22/2017  . STD exposure 05/22/2017    05/24/2017 PT, DPT, LAT, ATC  01/08/21  11:55 AM      [facility] [address]  [place], [place], [address] Phone: [phone]   Fax:  [phone]  Name: Nancy Young MRN: [id number] Date of Birth: 05/31/1986     Check all possible CPT codes: 97110- Therapeutic Exercise, [phone]- Neuro Re-education, [phone] - Gait Training, [phone] - Manual Therapy, 97530 - Therapeutic Activities, [phone] - Self Care, 97014 - Electrical stimulation (unattended), [id number] - Iontophoresis, [id number] - Ultrasound and [id number] - Physical performance training

## 2021-01-10 NOTE — Telephone Encounter (Signed)
Pt called and informed and stated understanding  °

## 2021-01-15 ENCOUNTER — Other Ambulatory Visit: Payer: Self-pay | Admitting: Physician Assistant

## 2021-01-17 ENCOUNTER — Ambulatory Visit: Payer: Medicaid Other | Admitting: Physical Therapy

## 2021-01-17 ENCOUNTER — Encounter: Payer: Self-pay | Admitting: Physical Therapy

## 2021-01-17 ENCOUNTER — Other Ambulatory Visit: Payer: Self-pay

## 2021-01-17 DIAGNOSIS — G8929 Other chronic pain: Secondary | ICD-10-CM

## 2021-01-17 DIAGNOSIS — M25562 Pain in left knee: Secondary | ICD-10-CM

## 2021-01-17 NOTE — Therapy (Signed)
[facility] [address] [place], [place], [address] Phone: [phone]   Fax:  [phone]  Physical Therapy Treatment  Patient Details  Name: Nancy Young MRN: [id number] Date of Birth: 06-24-1986 Referring Provider (PT): Jari Sportsman PA-C   Encounter Date: 01/17/2021   PT End of Session - 01/17/21 0841    Visit Number 2    Number of Visits 16    Date for PT Re-Evaluation 03/05/21    Authorization Type UHC managed MCD    Authorization Time Period 27 per calendar year    Authorization - Visit Number 1    Authorization - Number of Visits 27    PT Start Time 0805    PT Stop Time 0843    PT Time Calculation (min) 38 min    Activity Tolerance Patient tolerated treatment well    Behavior During Therapy [facility] for tasks assessed/performed           Past Medical History:  Diagnosis Date  . BV (bacterial vaginosis)   . Obesity   . Vaginal discharge 07/27/2017  . Yeast infection     Past Surgical History:  Procedure Laterality Date  . KNEE ARTHROSCOPY Left 12/30/2020  . LEEP  2013    There were no vitals filed for this visit.   Subjective Assessment - 01/17/21 0808    Subjective "I am doing okay today, I just got cleared to drive but can't drive for a while."    Currently in Pain? Yes    Pain Score 0-No pain    Pain Type Surgical pain              [facility] PT Assessment - 01/17/21 0001      Assessment   Medical Diagnosis S/P arthroscopy of left knee (G64.403)    Referring Provider (PT) Jari Sportsman PA-C      AROM   Left Knee Extension 0    Left Knee Flexion 113                         [facility] Adult PT Treatment/Exercise - 01/17/21 0001      Exercises   Exercises Knee/Hip      Knee/Hip Exercises: Aerobic   Recumbent Bike L1 x 6 min full revolutions      Knee/Hip Exercises: Seated   Long Arc Quad 3 sets;Strengthening;Left   1 set with 3# (noted feeling very easy), 2nd and 3rd  set 6#   Hamstring Curl 2 sets;10 reps;Strengthening   with green band   Sit to Sand 2 sets;10 reps   with RLE advanced forward to promote weight bearing on the LLE     Knee/Hip Exercises: Supine   Straight Leg Raises 2 sets;15 reps                  PT Education - 01/17/21 0841    Education Details reviewed HEP and updated today for sit to stand    Person(s) Educated Patient    Methods Explanation;Verbal cues    Comprehension Verbalized understanding;Verbal cues required            PT Short Term Goals - 01/08/21 1149      PT SHORT TERM GOAL #1   Title pt to be IND with inital HEP    Baseline no previous HEP    Time 4    Period Weeks    Status New    Target Date 02/05/21  PT SHORT TERM GOAL #2   Title increase L knee flexion to >/= 100 degrees for therapuetic progression    Baseline see flow sheet    Time 4    Period Weeks    Status New    Target Date 02/05/21             PT Long Term Goals - 01/08/21 1150      PT LONG TERM GOAL #1   Title increase L knee total arc ROM to >/= 2 - 120 degrees for functional ROM required for efficient gait pattern    Baseline no previous HEP    Time 8    Period Weeks    Status New    Target Date 03/05/21      PT LONG TERM GOAL #2   Title increase gross LLE strength to >/= 4+/5 to promote knee stability    Baseline unable to assess MMT due to pain/ irritability    Time 8    Period Weeks    Status New    Target Date 03/05/21      PT LONG TERM GOAL #3   Title pt to be able to sit, stand and walk for >/= 2 hours for functional endurnace required for work related tasks with </= 2/10 max pain    Baseline sit 60 min, stand and walk 20 min    Time 8    Period Weeks    Status New    Target Date 03/05/21      PT LONG TERM GOAL #4   Title increae LEFS score to >/= 55/80 to demo improvement in function    Baseline inital score 13/80    Time 8    Period Weeks    Status New    Target Date 03/05/21      PT LONG  TERM GOAL #5   Title pt to be IND with all HEP given and is able to maintain and progress current LOF IND    Baseline no previous HEP    Time 8    Period Weeks    Status New    Target Date 03/05/21                 Plan - 01/17/21 0834    Clinical Impression Statement pt reports compliance with her HEP and notes no pain today but does report it bothering her with prolonged sitting. She is making excellent progress with her ROM increasing are ROM 0 - 113 today. continued working on hip / knee strengthening. which she performed very well with no report of pain or issues. Plan to progress strengthening next session in more [facility] activities.    PT Treatment/Interventions ADLs/Self Care Home Management;Cryotherapy;Electrical Stimulation;Iontophoresis 4mg /ml Dexamethasone;Moist Heat;Ultrasound;Gait training;Stair training;Therapeutic activities;Therapeutic exercise;Balance training;Neuromuscular re-education;Patient/family education;Manual techniques;Passive range of motion;Taping;Vasopneumatic Device    PT Next Visit Plan update HEP PRN, knee/ patellar mobs, quad stretching, gross hip /knee strengthening , gait training, progress to CKC strengthening exercise and upsted HEP accordingly    PT Home Exercise Plan [id number] -  quad set, SLR, heel slides with strap, sidelying hip abduction    Consulted and Agree with Plan of Care Patient           Patient will benefit from skilled therapeutic intervention in order to improve the following deficits and impairments:  Improper body mechanics,Increased muscle spasms,Decreased strength,Pain,Increased edema,Decreased activity tolerance  Visit Diagnosis: Chronic pain of left knee     Problem List Patient Active Problem List  Diagnosis Date Noted  . Acute lateral meniscus tear of left knee 10/29/2020  . Perimeniscal cyst of left knee 10/29/2020  . Prediabetes 10/07/2020  . GERD (gastroesophageal reflux disease) 08/27/2020  . Abdominal pain in  female 05/17/2020  . Left knee pain 12/02/2019  . Bilateral knee pain 09/23/2019  . Strain of left foot 07/17/2019  . Urinary frequency 06-18-2019  . Sudden death of family member 03-21-19  . Carpal tunnel syndrome 12/31/2018  . Vaginal discharge 05/14/2018  . Health care maintenance 07/02/2017  . Obesity 05/22/2017  . STD exposure 05/22/2017   Lulu Riding PT, DPT, LAT, ATC  01/17/21  8:43 AM      [facility] [address] [place], [place], [address] Phone: [phone]   Fax:  [phone]  Name: Nancy Young MRN: [id number] Date of Birth: 30-Aug-1986

## 2021-01-20 ENCOUNTER — Other Ambulatory Visit: Payer: Self-pay

## 2021-01-20 ENCOUNTER — Ambulatory Visit: Payer: Medicaid Other | Admitting: Physical Therapy

## 2021-01-20 ENCOUNTER — Encounter: Payer: Self-pay | Admitting: Physical Therapy

## 2021-01-20 DIAGNOSIS — M25562 Pain in left knee: Secondary | ICD-10-CM

## 2021-01-20 DIAGNOSIS — G8929 Other chronic pain: Secondary | ICD-10-CM

## 2021-01-20 NOTE — Therapy (Signed)
[facility] [address] [place], [place], [address] Phone: [phone]   Fax:  [phone]  Physical Therapy Treatment  Patient Details  Name: Nancy Young MRN: [id number] Date of Birth: [birth year] Referring Provider (PT): Jari Sportsman PA-C   Encounter Date: 01/20/2021   PT End of Session - 01/20/21 0945    Visit Number 3    Number of Visits 16    Date for PT Re-Evaluation 03/05/21    Authorization Type UHC managed MCD    PT Start Time 0939   Patient 9 minutes late   PT Stop Time 1018    PT Time Calculation (min) 39 min    Activity Tolerance Patient tolerated treatment well    Behavior During Therapy [facility] for tasks assessed/performed           Past Medical History:  Diagnosis Date  . BV (bacterial vaginosis)   . Obesity   . Vaginal discharge 07/27/2017  . Yeast infection     Past Surgical History:  Procedure Laterality Date  . KNEE ARTHROSCOPY Left 12/30/2020  . LEEP  2013    There were no vitals filed for this visit.   Subjective Assessment - 01/20/21 0944    Subjective Patient reports she is doing OK todayt. Yesterday it got pretty sore. The pain comes and goes. Yesterday she did a lot of sitting.    Limitations Standing    How long can you sit comfortably? 60 min    How long can you stand comfortably? 20 min    How long can you walk comfortably? 20 min    Patient Stated Goals get back to work, be able to stand and walk without issues and return to regular.    Currently in Pain? No/denies              [facility] PT Assessment - 01/20/21 0001      AROM   Left Knee Extension 0    Left Knee Flexion 114                         [facility] Adult PT Treatment/Exercise - 01/20/21 0001      Knee/Hip Exercises: Standing   Other Standing Knee Exercises standing slow march 2x10    Other Standing Knee Exercises x20 heel raise      Knee/Hip Exercises: Seated   Long Arc Quad 3  sets;Strengthening;Left   1 set with 3# (noted feeling very easy), 2nd and 3rd set 6#   Hamstring Curl 2 sets;10 reps;Strengthening   with green band   Sit to Sand 2 sets;10 reps   with RLE advanced forward to promote weight bearing on the LLE     Knee/Hip Exercises: Supine   Quad Sets Limitations x10 5 sec hold    Short Arc Quad Sets Limitations 2x15    Straight Leg Raises 2 sets;15 reps      Manual Therapy   Manual therapy comments PROM into flexion                  PT Education - 01/20/21 0944    Education Details reviewed exercises and POC    Person(s) Educated Patient    Methods Explanation;Demonstration;Verbal cues;Tactile cues    Comprehension Verbalized understanding;Returned demonstration;Verbal cues required;Tactile cues required            PT Short Term Goals - 01/08/21 1149      PT SHORT TERM GOAL #1   Title pt  to be IND with inital HEP    Baseline no previous HEP    Time 4    Period Weeks    Status New    Target Date 02/05/21      PT SHORT TERM GOAL #2   Title increase L knee flexion to >/= 100 degrees for therapuetic progression    Baseline see flow sheet    Time 4    Period Weeks    Status New    Target Date 02/05/21             PT Long Term Goals - 01/08/21 1150      PT LONG TERM GOAL #1   Title increase L knee total arc ROM to >/= 2 - 120 degrees for functional ROM required for efficient gait pattern    Baseline no previous HEP    Time 8    Period Weeks    Status New    Target Date 03/05/21      PT LONG TERM GOAL #2   Title increase gross LLE strength to >/= 4+/5 to promote knee stability    Baseline unable to assess MMT due to pain/ irritability    Time 8    Period Weeks    Status New    Target Date 03/05/21      PT LONG TERM GOAL #3   Title pt to be able to sit, stand and walk for >/= 2 hours for functional endurnace required for work related tasks with </= 2/10 max pain    Baseline sit 60 min, stand and walk 20 min     Time 8    Period Weeks    Status New    Target Date 03/05/21      PT LONG TERM GOAL #4   Title increae LEFS score to >/= 55/80 to demo improvement in function    Baseline inital score 13/80    Time 8    Period Weeks    Status New    Target Date 03/05/21      PT LONG TERM GOAL #5   Title pt to be IND with all HEP given and is able to maintain and progress current LOF IND    Baseline no previous HEP    Time 8    Period Weeks    Status New    Target Date 03/05/21                 Plan - 01/20/21 1014    Clinical Impression Statement Patient is making good progrees. Her motion was measured at 113 with minor pain. therapy progressedher to standing exercises today. She had no increase in pain/. Therapy updated her HEP. She was given standing exercises for home.    Stability/Clinical Decision Making Stable/Uncomplicated    Clinical Decision Making Low    Rehab Potential Good    PT Frequency 2x / week    PT Duration 8 weeks    PT Treatment/Interventions ADLs/Self Care Home Management;Cryotherapy;Electrical Stimulation;Iontophoresis 4mg /ml Dexamethasone;Moist Heat;Ultrasound;Gait training;Stair training;Therapeutic activities;Therapeutic exercise;Balance training;Neuromuscular re-education;Patient/family education;Manual techniques;Passive range of motion;Taping;Vasopneumatic Device    PT Next Visit Plan update HEP PRN, knee/ patellar mobs, quad stretching, gross hip /knee strengthening , gait training, progress to CKC strengthening exercise and upsted HEP accordingly    PT Home Exercise Plan [id number] -  quad set, SLR, heel slides with strap, sidelying hip abduction    Consulted and Agree with Plan of Care Patient  Patient will benefit from skilled therapeutic intervention in order to improve the following deficits and impairments:  Improper body mechanics,Increased muscle spasms,Decreased strength,Pain,Increased edema,Decreased activity tolerance  Visit  Diagnosis: Chronic pain of left knee     Problem List Patient Active Problem List   Diagnosis Date Noted  . Acute lateral meniscus tear of left knee 10/29/2020  . Perimeniscal cyst of left knee 10/29/2020  . Prediabetes 10/07/2020  . GERD (gastroesophageal reflux disease) 08/27/2020  . Abdominal pain in female 05/17/2020  . Left knee pain 12/02/2019  . Bilateral knee pain 09/23/2019  . Strain of left foot 07/17/2019  . Urinary frequency 06/14/19  . Sudden death of family member Mar 17, 2019  . Carpal tunnel syndrome 12/31/2018  . Vaginal discharge 05/14/2018  . Health care maintenance 07/02/2017  . Obesity 05/22/2017  . STD exposure 05/22/2017    Dessie Coma PT DPT  01/20/2021, 3:39 PM  [facility] [address] [place], [place], [address] Phone: [phone]   Fax:  [phone]  Name: Corissa Oguinn MRN: [id number] Date of Birth: 18-Aug-1986

## 2021-01-22 ENCOUNTER — Other Ambulatory Visit: Payer: Self-pay | Admitting: Physician Assistant

## 2021-01-25 ENCOUNTER — Telehealth: Payer: Self-pay | Admitting: Physical Therapy

## 2021-01-25 ENCOUNTER — Ambulatory Visit: Payer: Medicaid Other | Admitting: Physical Therapy

## 2021-01-25 NOTE — Telephone Encounter (Signed)
LVM regarding missed appointment today. I noted when her next scheduled appointment day/time is. Reviewed our attendance policy and where she can locate the information on it. Asked if she cannot make her next appointment to call and we can work to cancel/ reschedule that appointment for her.  Tagen Milby PT, DPT, LAT, ATC  01/25/21  9:52 AM

## 2021-01-27 ENCOUNTER — Encounter: Payer: Self-pay | Admitting: Physical Therapy

## 2021-01-27 ENCOUNTER — Other Ambulatory Visit: Payer: Self-pay

## 2021-01-27 ENCOUNTER — Ambulatory Visit: Payer: Medicaid Other | Admitting: Physical Therapy

## 2021-01-27 DIAGNOSIS — G8929 Other chronic pain: Secondary | ICD-10-CM

## 2021-01-27 DIAGNOSIS — M25562 Pain in left knee: Secondary | ICD-10-CM

## 2021-01-27 NOTE — Therapy (Signed)
[facility] [address] [place], [place], [address] Phone: [phone]   Fax:  [phone]  Physical Therapy Treatment  Patient Details  Name: Nancy Young MRN: [id number] Date of Birth: 07-22-1986 Referring Provider (PT): Jari Sportsman PA-C   Encounter Date: 01/27/2021   PT End of Session - 01/27/21 0815    Visit Number 4    Number of Visits 16    Date for PT Re-Evaluation 03/05/21    Authorization Type UHC managed MCD    Authorization Time Period 27 per calendar year    Authorization - Visit Number 3    Authorization - Number of Visits 27    PT Start Time 0815   pt arrived 15 min late   PT Stop Time 0840    PT Time Calculation (min) 25 min    Activity Tolerance Patient tolerated treatment well           Past Medical History:  Diagnosis Date  . BV (bacterial vaginosis)   . Obesity   . Vaginal discharge 07/27/2017  . Yeast infection     Past Surgical History:  Procedure Laterality Date  . KNEE ARTHROSCOPY Left 12/30/2020  . LEEP  2013    There were no vitals filed for this visit.   Subjective Assessment - 01/27/21 0817    Subjective " i've noticed I have been doing alot better. I have noticed my leg has been locking on me when I am standing."    Currently in Pain? No/denies    Pain Score 0-No pain    Pain Location Knee    Pain Orientation Left    Pain Descriptors / Indicators Aching    Pain Type Chronic pain              [facility] PT Assessment - 01/27/21 0001      Assessment   Medical Diagnosis S/P arthroscopy of left knee (R74.081)    Referring Provider (PT) Jari Sportsman PA-C      AROM   Left Knee Extension 0    Left Knee Flexion 125                         [facility] Adult PT Treatment/Exercise - 01/27/21 0001      Knee/Hip Exercises: Aerobic   Nustep L6 x LE only   cues for a steady pace     Knee/Hip Exercises: Machines for Strengthening   Cybex Knee Extension 2  x 15 25# bil      Knee/Hip Exercises: Standing   Hip Abduction 2 sets;Knee straight;10 reps   with red theraband around ankles   Step Down 2 sets;10 reps;Left;Step Height: 4";Step Height: 6"   holding on to freemotin, 1 set with 4 in, 1 set with 6   Functional Squat 2 sets;10 reps   holding on to [employer] Limitations discussed avoiding locking knees in extension at end range keep "soft lock"                  PT Education - 01/27/21 0838    Education Details Reviewed HEp and updated today for standing hip abduction and squat    Person(s) Educated Patient    Methods Explanation;Verbal cues;Handout    Comprehension Verbalized understanding;Verbal cues required            PT Short Term Goals - 01/08/21 1149      PT SHORT TERM GOAL #1   Title pt  to be IND with inital HEP    Baseline no previous HEP    Time 4    Period Weeks    Status New    Target Date 02/05/21      PT SHORT TERM GOAL #2   Title increase L knee flexion to >/= 100 degrees for therapuetic progression    Baseline see flow sheet    Time 4    Period Weeks    Status New    Target Date 02/05/21             PT Long Term Goals - 01/08/21 1150      PT LONG TERM GOAL #1   Title increase L knee total arc ROM to >/= 2 - 120 degrees for functional ROM required for efficient gait pattern    Baseline no previous HEP    Time 8    Period Weeks    Status New    Target Date 03/05/21      PT LONG TERM GOAL #2   Title increase gross LLE strength to >/= 4+/5 to promote knee stability    Baseline unable to assess MMT due to pain/ irritability    Time 8    Period Weeks    Status New    Target Date 03/05/21      PT LONG TERM GOAL #3   Title pt to be able to sit, stand and walk for >/= 2 hours for functional endurnace required for work related tasks with </= 2/10 max pain    Baseline sit 60 min, stand and walk 20 min    Time 8    Period Weeks    Status New    Target Date 03/05/21       PT LONG TERM GOAL #4   Title increae LEFS score to >/= 55/80 to demo improvement in function    Baseline inital score 13/80    Time 8    Period Weeks    Status New    Target Date 03/05/21      PT LONG TERM GOAL #5   Title pt to be IND with all HEP given and is able to maintain and progress current LOF IND    Baseline no previous HEP    Time 8    Period Weeks    Status New    Target Date 03/05/21                 Plan - 01/27/21 0824    Clinical Impression Statement limited session due to pt arriving 15 min late. she is making progress with knee ROM increasing flexion 125 degrees. She does note having locking occuring with standing, discussed keeping diary to help track when it occurs, and what position she is in. focused session on CKC strengthening which she did very well with but does fatigue quickly.    PT Treatment/Interventions ADLs/Self Care Home Management;Cryotherapy;Electrical Stimulation;Iontophoresis 4mg /ml Dexamethasone;Moist Heat;Ultrasound;Gait training;Stair training;Therapeutic activities;Therapeutic exercise;Balance training;Neuromuscular re-education;Patient/family education;Manual techniques;Passive range of motion;Taping;Vasopneumatic Device    PT Next Visit Plan update HEP PRN, knee/ patellar mobs, quad stretching, gross hip /knee strengthening , gait training, progress to CKC strengthening exercise and upsted HEP accordingly    PT Home Exercise Plan [id number] -  quad set, SLR, heel slides with strap, sidelying hip abduction    Consulted and Agree with Plan of Care Patient           Patient will benefit from skilled therapeutic intervention in order to improve the  following deficits and impairments:  Improper body mechanics,Increased muscle spasms,Decreased strength,Pain,Increased edema,Decreased activity tolerance  Visit Diagnosis: Chronic pain of left knee     Problem List Patient Active Problem List   Diagnosis Date Noted  . Acute lateral meniscus  tear of left knee 10/29/2020  . Perimeniscal cyst of left knee 10/29/2020  . Prediabetes 10/07/2020  . GERD (gastroesophageal reflux disease) 08/27/2020  . Abdominal pain in female 05/17/2020  . Left knee pain 12/02/2019  . Bilateral knee pain 09/23/2019  . Strain of left foot 07/17/2019  . Urinary frequency 06-04-19  . Sudden death of family member 03-07-19  . Carpal tunnel syndrome 12/31/2018  . Vaginal discharge 05/14/2018  . Health care maintenance 07/02/2017  . Obesity 05/22/2017  . STD exposure 05/22/2017    Lulu Riding PT, DPT, LAT, ATC  01/27/21  8:41 AM      [facility] [address] [place], [place], [address] Phone: [phone]   Fax:  [phone]  Name: Nancy Young MRN: [id number] Date of Birth: 05/09/1986

## 2021-01-29 ENCOUNTER — Other Ambulatory Visit: Payer: Self-pay | Admitting: Physician Assistant

## 2021-01-31 DIAGNOSIS — R635 Abnormal weight gain: Secondary | ICD-10-CM | POA: Diagnosis not present

## 2021-01-31 DIAGNOSIS — M25562 Pain in left knee: Secondary | ICD-10-CM | POA: Diagnosis not present

## 2021-02-01 ENCOUNTER — Encounter: Payer: Self-pay | Admitting: Physical Therapy

## 2021-02-01 ENCOUNTER — Other Ambulatory Visit: Payer: Self-pay

## 2021-02-01 ENCOUNTER — Ambulatory Visit: Payer: Medicaid Other | Admitting: Physical Therapy

## 2021-02-01 DIAGNOSIS — M25562 Pain in left knee: Secondary | ICD-10-CM | POA: Diagnosis not present

## 2021-02-01 DIAGNOSIS — G8929 Other chronic pain: Secondary | ICD-10-CM

## 2021-02-01 NOTE — Therapy (Addendum)
[place], [place], [address] Phone: [phone]   Fax:  [phone]  Physical Therapy Treatment / Discharge  Patient Details  Name: Nancy Young MRN: [id number] Date of Birth: [birth year] Referring Provider (PT): Dwana Melena PA-C   Encounter Date: 02/01/2021   PT End of Session - 02/01/21 1232    Visit Number 5    Number of Visits 16    Date for PT Re-Evaluation 03/05/21    Authorization Type UHC managed MCD    Authorization Time Period 27 per calendar year    Authorization - Visit Number 4    Authorization - Number of Visits 27    PT Start Time 1147    PT Stop Time 1230    PT Time Calculation (min) 43 min    Activity Tolerance Patient tolerated treatment well    Behavior During Therapy [facility] for tasks assessed/performed           Past Medical History:  Diagnosis Date  . BV (bacterial vaginosis)   . Obesity   . Vaginal discharge 07/27/2017  . Yeast infection     Past Surgical History:  Procedure Laterality Date  . KNEE ARTHROSCOPY Left 12/30/2020  . LEEP  2013    There were no vitals filed for this visit.   Subjective Assessment - 02/01/21 1241    Subjective Pt reports that overall her knee is doing well.  She did have two instances of "locking" over the weekend.  Once while standing in line at the mall and once while walking in a swimming pool.  This lasted for about 30'' and resolved with with some gentle motion.  Denies pain but reports this isn "uncomfortable".                             [facility] Adult PT Treatment/Exercise - 02/01/21 0001      Knee/Hip Exercises: Aerobic   Nustep L6 x 61mn LE only      Knee/Hip Exercises: Machines for Strengthening   Cybex Knee Extension 2x10 25# 1x10 30# with 3 sec hold      Knee/Hip Exercises: Standing   Hip Abduction 2 sets;Knee straight;10 reps    Step Down 2 sets;10 reps;Left;Step Height: 4";Step Height: 6"     Functional Squat 2 sets;10 reps    Other Standing Knee Exercises Lateral walking red TB    Other Standing Knee Exercises Retro TM walking 2 bouts to fatigue                  PT Education - 02/01/21 1231    Education Details instructed pt to increase reps of HEP exercices if she felt they were too easy    Person(s) Educated Patient    Methods Explanation    Comprehension Verbalized understanding            PT Short Term Goals - 01/08/21 1149      PT SHORT TERM GOAL #1   Title pt to be IND with inital HEP    Baseline no previous HEP    Time 4    Period Weeks    Status New    Target Date 02/05/21      PT SHORT TERM GOAL #2   Title increase L knee flexion to >/= 100 degrees for therapuetic progression    Baseline see flow sheet    Time 4    Period Weeks    Status  New    Target Date 02/05/21             PT Long Term Goals - 01/08/21 1150      PT LONG TERM GOAL #1   Title increase L knee total arc ROM to >/= 2 - 120 degrees for functional ROM required for efficient gait pattern    Baseline no previous HEP    Time 8    Period Weeks    Status New    Target Date 03/05/21      PT LONG TERM GOAL #2   Title increase gross LLE strength to >/= 4+/5 to promote knee stability    Baseline unable to assess MMT due to pain/ irritability    Time 8    Period Weeks    Status New    Target Date 03/05/21      PT LONG TERM GOAL #3   Title pt to be able to sit, stand and walk for >/= 2 hours for functional endurnace required for work related tasks with </= 2/10 max pain    Baseline sit 60 min, stand and walk 20 min    Time 8    Period Weeks    Status New    Target Date 03/05/21      PT LONG TERM GOAL #4   Title increae LEFS score to >/= 55/80 to demo improvement in function    Baseline inital score 13/80    Time 8    Period Weeks    Status New    Target Date 03/05/21      PT LONG TERM GOAL #5   Title pt to be IND with all HEP given and is able to maintain and  progress current LOF IND    Baseline no previous HEP    Time 8    Period Weeks    Status New    Target Date 03/05/21                 Plan - 02/01/21 1234    Clinical Impression Statement Pt tolerated session well.  She did report one instance of knee "locking" when she was standing in single leg stance on L during R hip abduction.  She showed significant fatigue with quad based exercises and in hip abduction.    Stability/Clinical Decision Making Stable/Uncomplicated    PT Treatment/Interventions ADLs/Self Care Home Management;Cryotherapy;Electrical Stimulation;Iontophoresis 42m/ml Dexamethasone;Moist Heat;Ultrasound;Gait training;Stair training;Therapeutic activities;Therapeutic exercise;Balance training;Neuromuscular re-education;Patient/family education;Manual techniques;Passive range of motion;Taping;Vasopneumatic Device    PT Next Visit Plan update HEP PRN, knee/ patellar mobs, quad stretching, gross hip /knee strengthening , gait training, progress to CKC strengthening exercise and upsted HEP accordingly    PT Home Exercise Plan [id number] -  quad set, SLR, heel slides with strap, sidelying hip abduction           Patient will benefit from skilled therapeutic intervention in order to improve the following deficits and impairments:  Improper body mechanics,Increased muscle spasms,Decreased strength,Pain,Increased edema,Decreased activity tolerance  Visit Diagnosis: Chronic pain of left knee     Problem List Patient Active Problem List   Diagnosis Date Noted  . Acute lateral meniscus tear of left knee 10/29/2020  . Perimeniscal cyst of left knee 10/29/2020  . Prediabetes 10/07/2020  . GERD (gastroesophageal reflux disease) 08/27/2020  . Abdominal pain in female 05/17/2020  . Left knee pain 12/02/2019  . Bilateral knee pain 09/23/2019  . Strain of left foot 07/17/2019  . Urinary frequency 02020-08-14 . Sudden death  of family member 02/21/2019  . Carpal tunnel syndrome  12/31/2018  . Vaginal discharge 05/14/2018  . Health care maintenance 07/02/2017  . Obesity 05/22/2017  . STD exposure 05/22/2017    Mathis Dad PT, DPT 02/01/2021, 12:44 PM  [facility] [address] [place], [place], [address] Phone: [phone]   Fax:  [phone]  Name: Maeva Dant MRN: [id number] Date of Birth: Jun 12, 1986     PHYSICAL THERAPY DISCHARGE SUMMARY  Visits from Start of Care: 5  Current functional level related to goals / functional outcomes: See goals   Remaining deficits: Current status unknown   Education / Equipment: HEP  Plan: Patient agrees to discharge.  Patient goals were not met. Patient is being discharged due to not returning since the last visit.  ?????       Kristoffer Leamon PT, DPT, LAT, ATC  03/10/21  10:28 AM

## 2021-02-03 ENCOUNTER — Ambulatory Visit (INDEPENDENT_AMBULATORY_CARE_PROVIDER_SITE_OTHER): Payer: Medicaid Other | Admitting: Physician Assistant

## 2021-02-03 ENCOUNTER — Encounter: Payer: Self-pay | Admitting: Orthopaedic Surgery

## 2021-02-03 ENCOUNTER — Telehealth: Payer: Self-pay | Admitting: Physical Therapy

## 2021-02-03 ENCOUNTER — Ambulatory Visit: Payer: Medicaid Other | Admitting: Physical Therapy

## 2021-02-03 ENCOUNTER — Other Ambulatory Visit: Payer: Self-pay

## 2021-02-03 VITALS — Ht 63.0 in | Wt 230.0 lb

## 2021-02-03 DIAGNOSIS — Z9889 Other specified postprocedural states: Secondary | ICD-10-CM

## 2021-02-03 NOTE — Telephone Encounter (Signed)
LVM regarding missed appointment today. I discussed that it is her second missed appointment and based on the clinic policy after 2 missed appointments we need to cancel all future appointments. If she wants to get back on the schedule she will need to call back to get back on the schedule but can only schedule 1 appointment at time.    Tiler Brandis PT, DPT, LAT, ATC  02/03/21  9:29 AM

## 2021-02-03 NOTE — Progress Notes (Signed)
Post-Op Visit Note   Patient: Nancy Young           Date of Birth: 03-26-1986           MRN: [id number] Visit Date: 02/03/2021 PCP: Towanda Octave, MD   Assessment & Plan:  Chief Complaint:  Chief Complaint  Patient presents with  . Left Knee - Follow-up    5wk s/p knee arthroscopy   Visit Diagnoses:  1. S/P arthroscopy of left knee     Plan: Patient is a pleasant 35 year old female who comes in today 6 weeks out left knee arthroscopic debridement lateral meniscus.  She has been doing fairly well.  She has been in physical therapy making great progress.  She does note some stiffness and feeling of a charley horse when she sits or stands for too long period of time.  Overall, much improved.  Examination of her left knee reveals fully healed surgical portals without complication.  Range of motion 0 to 125 degrees.  She is neurovascular intact distally.  At this point, she will continue to advance with activity as tolerated.  We will write her a new note for work to return on 02/15/2021 working shifts no longer than 6 hours at a time as she is a Water quality scientist at [employer] and is constantly walking the halls.  She will follow-up with [place] as needed. Follow-Up Instructions: Return if symptoms worsen or fail to improve.   Orders:  No orders of the defined types were placed in this encounter.  No orders of the defined types were placed in this encounter.   Imaging: No new imaging  PMFS History: Patient Active Problem List   Diagnosis Date Noted  . Acute lateral meniscus tear of left knee 10/29/2020  . Perimeniscal cyst of left knee 10/29/2020  . Prediabetes 10/07/2020  . GERD (gastroesophageal reflux disease) 08/27/2020  . Abdominal pain in female 05/17/2020  . Left knee pain 12/02/2019  . Bilateral knee pain 09/23/2019  . Strain of left foot 07/17/2019  . Urinary frequency 05/25/2019  . Sudden death of family member 02-25-2019  . Carpal tunnel syndrome 12/31/2018  .  Vaginal discharge 05/14/2018  . Health care maintenance 07/02/2017  . Obesity 05/22/2017  . STD exposure 05/22/2017   Past Medical History:  Diagnosis Date  . BV (bacterial vaginosis)   . Obesity   . Vaginal discharge 07/27/2017  . Yeast infection     Family History  Problem Relation Age of Onset  . Diabetes Mother   . Hypertension Mother   . Hyperlipidemia Mother   . Bipolar disorder Mother   . Diabetes Maternal Grandmother   . Sickle cell anemia Maternal Grandmother   . Heart attack Maternal Grandmother   . Anxiety disorder Maternal Grandmother   . Hypertension Maternal Grandmother   . Hyperlipidemia Maternal Grandmother   . Stroke Maternal Grandmother   . Hepatitis Maternal Grandfather   . Lung disease Maternal Grandfather   . Diabetes Paternal Grandmother   . Hypertension Paternal Grandmother   . Hyperlipidemia Paternal Grandmother   . Kidney disease Maternal Aunt   . Diabetes Maternal Aunt   . Hypertension Maternal Aunt   . Stomach cancer Other     Past Surgical History:  Procedure Laterality Date  . KNEE ARTHROSCOPY Left 12/30/2020  . LEEP  2013   Social History   Occupational History  . Occupation: CNA  Tobacco Use  . Smoking status: Never Smoker  . Smokeless tobacco: Never Used  Vaping Use  . Vaping  Use: Never used  Substance and Sexual Activity  . Alcohol use: Yes    Comment: 1-2 drinks per 2-3 times/week  . Drug use: No  . Sexual activity: Yes    Partners: Female    Birth control/protection: None

## 2021-02-05 ENCOUNTER — Other Ambulatory Visit: Payer: Self-pay | Admitting: Physician Assistant

## 2021-02-08 ENCOUNTER — Encounter: Payer: Medicaid Other | Admitting: Physical Therapy

## 2021-02-10 ENCOUNTER — Ambulatory Visit: Payer: Medicaid Other | Admitting: Physical Therapy

## 2021-02-12 ENCOUNTER — Other Ambulatory Visit: Payer: Self-pay | Admitting: Physician Assistant

## 2021-02-17 ENCOUNTER — Other Ambulatory Visit: Payer: Self-pay

## 2021-02-17 ENCOUNTER — Ambulatory Visit (HOSPITAL_COMMUNITY)
Admission: EM | Admit: 2021-02-17 | Discharge: 2021-02-17 | Discharge status: Against Medical Advice | Payer: Medicaid Other

## 2021-02-17 DIAGNOSIS — Z5321 Procedure and treatment not carried out due to patient leaving prior to being seen by health care provider: Secondary | ICD-10-CM

## 2021-02-19 ENCOUNTER — Other Ambulatory Visit: Payer: Self-pay | Admitting: Physician Assistant

## 2021-02-25 ENCOUNTER — Ambulatory Visit (HOSPITAL_COMMUNITY)
Admission: EM | Admit: 2021-02-25 | Discharge: 2021-02-25 | Disposition: A | Discharge status: Home / Self Care | Payer: Medicaid Other | Attending: Physician Assistant | Admitting: Physician Assistant

## 2021-02-25 ENCOUNTER — Other Ambulatory Visit: Payer: Self-pay

## 2021-02-25 ENCOUNTER — Encounter (HOSPITAL_COMMUNITY): Payer: Self-pay | Admitting: *Deleted

## 2021-02-25 DIAGNOSIS — R059 Cough, unspecified: Secondary | ICD-10-CM

## 2021-02-25 DIAGNOSIS — U071 COVID-19: Secondary | ICD-10-CM | POA: Insufficient documentation

## 2021-02-25 DIAGNOSIS — Z79899 Other long term (current) drug therapy: Secondary | ICD-10-CM | POA: Insufficient documentation

## 2021-02-25 DIAGNOSIS — R509 Fever, unspecified: Secondary | ICD-10-CM | POA: Diagnosis present

## 2021-02-25 DIAGNOSIS — J069 Acute upper respiratory infection, unspecified: Secondary | ICD-10-CM | POA: Diagnosis not present

## 2021-02-25 LAB — SARS CORONAVIRUS 2 (TAT 6-24 HRS): SARS Coronavirus 2: POSITIVE — AB

## 2021-02-25 MED ORDER — BENZONATATE 100 MG PO CAPS: 100.0000 mg | ORAL_CAPSULE | Freq: Three times a day (TID) | ORAL | 0 refills | Status: DC

## 2021-02-25 NOTE — ED Provider Notes (Signed)
[facility]    CSN: [id number] Arrival date & time: 02/25/21  0801      History   Chief Complaint Chief Complaint  Patient presents with  . Fever  . Fatigue  . Diarrhea  . Cough  . metal taste in mouth    HPI Nancy Young is a 35 y.o. female.   Patient presents today with a 4-day history of body aches.  Reports associated fever, headache, sore throat, cough, nausea, 1 episode of emesis, diarrhea.  Denies dizziness, syncope, chest pain, shortness of breath.  She has taken several over-the-counter medications for symptom management.  Reports she is up-to-date on influenza and COVID-19 vaccinations.  Denies any known sick contacts.  Denies any recent antibiotic use.  Denies history of allergies, asthma, smoking, COPD.  She has missed work as result of symptoms and is requesting work excuse note today.     Past Medical History:  Diagnosis Date  . BV (bacterial vaginosis)   . Obesity   . Vaginal discharge 07/27/2017  . Yeast infection     Patient Active Problem List   Diagnosis Date Noted  . Acute lateral meniscus tear of left knee 10/29/2020  . Perimeniscal cyst of left knee 10/29/2020  . Prediabetes 10/07/2020  . GERD (gastroesophageal reflux disease) 08/27/2020  . Abdominal pain in female 05/17/2020  . Left knee pain 12/02/2019  . Bilateral knee pain 09/23/2019  . Strain of left foot 07/17/2019  . Urinary frequency 06/06/19  . Sudden death of family member 03/09/2019  . Carpal tunnel syndrome 12/31/2018  . Vaginal discharge 05/14/2018  . Health care maintenance 07/02/2017  . Obesity 05/22/2017  . STD exposure 05/22/2017    Past Surgical History:  Procedure Laterality Date  . KNEE ARTHROSCOPY Left 12/30/2020  . LEEP  2013    OB History   No obstetric history on file.      Home Medications    Prior to Admission medications   Medication Sig Start Date End Date Taking? Authorizing Provider  benzonatate (TESSALON) 100 MG capsule  Take 1 capsule (100 mg total) by mouth every 8 (eight) hours. 02/25/21  Yes Sabree Nuon K, PA-C  famotidine (PEPCID) 20 MG tablet TAKE 1 TABLET(20 MG) BY MOUTH TWICE DAILY 12/15/20   Towanda Octave, MD  ibuprofen (ADVIL) 800 MG tablet Take 1 tablet (800 mg total) by mouth every 8 (eight) hours as needed. 01/06/21   Cristie Hem, PA-C  ondansetron (ZOFRAN) 4 MG tablet Take 1 tablet (4 mg total) by mouth every 8 (eight) hours as needed for nausea or vomiting. 12/29/20   Cristie Hem, PA-C    Family History Family History  Problem Relation Age of Onset  . Diabetes Mother   . Hypertension Mother   . Hyperlipidemia Mother   . Bipolar disorder Mother   . Diabetes Maternal Grandmother   . Sickle cell anemia Maternal Grandmother   . Heart attack Maternal Grandmother   . Anxiety disorder Maternal Grandmother   . Hypertension Maternal Grandmother   . Hyperlipidemia Maternal Grandmother   . Stroke Maternal Grandmother   . Hepatitis Maternal Grandfather   . Lung disease Maternal Grandfather   . Diabetes Paternal Grandmother   . Hypertension Paternal Grandmother   . Hyperlipidemia Paternal Grandmother   . Kidney disease Maternal Aunt   . Diabetes Maternal Aunt   . Hypertension Maternal Aunt   . Stomach cancer Other     Social History Social History   Tobacco Use  . Smoking status:  Never Smoker  . Smokeless tobacco: Never Used  Vaping Use  . Vaping Use: Never used  Substance Use Topics  . Alcohol use: Yes    Comment: 1-2 drinks per 2-3 times/week  . Drug use: No     Allergies   Patient has no known allergies.   Review of Systems Review of Systems  Constitutional: Positive for activity change, fatigue and fever. Negative for appetite change.  HENT: Positive for sore throat. Negative for congestion, sinus pressure and sneezing.   Respiratory: Positive for cough. Negative for chest tightness and shortness of breath.   Cardiovascular: Negative for chest pain.   Gastrointestinal: Positive for diarrhea, nausea and vomiting. Negative for abdominal pain.  Musculoskeletal: Positive for arthralgias and back pain. Negative for myalgias.  Neurological: Positive for headaches. Negative for dizziness and light-headedness.     Physical Exam Triage Vital Signs ED Triage Vitals  Enc Vitals Group     BP 02/25/21 0819 116/82     Pulse Rate 02/25/21 0819 84     Resp 02/25/21 0819 18     Temp 02/25/21 0819 99 F (37.2 C)     Temp Source 02/25/21 0819 Oral     SpO2 02/25/21 0819 98 %     Weight --      Height --      Head Circumference --      Peak Flow --      Pain Score 02/25/21 0817 0     Pain Loc --      Pain Edu? --      Excl. in GC? --    No data found.  Updated Vital Signs BP 116/82 (BP Location: Right Arm)   Pulse 84   Temp 99 F (37.2 C) (Oral)   Resp 18   LMP 01/12/2021   SpO2 98%   Visual Acuity Right Eye Distance:   Left Eye Distance:   Bilateral Distance:    Right Eye Near:   Left Eye Near:    Bilateral Near:     Physical Exam Vitals reviewed.  Constitutional:      General: She is awake. She is not in acute distress.    Appearance: Normal appearance. She is not ill-appearing.     Comments: Very pleasant female appears stated age in no acute distress  HENT:     Head: Normocephalic and atraumatic.     Right Ear: Tympanic membrane, ear canal and external ear normal. Tympanic membrane is not erythematous or bulging.     Left Ear: Tympanic membrane, ear canal and external ear normal. Tympanic membrane is not erythematous or bulging.     Nose:     Right Sinus: No maxillary sinus tenderness or frontal sinus tenderness.     Left Sinus: No maxillary sinus tenderness or frontal sinus tenderness.     Mouth/Throat:     Pharynx: Uvula midline. No oropharyngeal exudate or posterior oropharyngeal erythema.  Cardiovascular:     Rate and Rhythm: Normal rate and regular rhythm.     Heart sounds: Normal heart sounds. No murmur  heard.   Pulmonary:     Effort: Pulmonary effort is normal.     Breath sounds: Normal breath sounds. No wheezing, rhonchi or rales.     Comments: Clear to auscultation bilaterally Abdominal:     General: Bowel sounds are normal.     Palpations: Abdomen is soft.     Tenderness: There is no abdominal tenderness.     Comments: Benign abdominal exam  Lymphadenopathy:  Head:     Right side of head: No submental, submandibular or tonsillar adenopathy.     Left side of head: No submental, submandibular or tonsillar adenopathy.     Cervical: No cervical adenopathy.  Psychiatric:        Behavior: Behavior is cooperative.      UC Treatments / Results  Labs (all labs ordered are listed, but only abnormal results are displayed) Labs Reviewed  SARS CORONAVIRUS 2 (TAT 6-24 HRS)    EKG   Radiology No results found.  Procedures Procedures (including critical care time)  Medications Ordered in UC Medications - No data to display  Initial Impression / Assessment and Plan / UC Course  I have reviewed the triage vital signs and the nursing notes.  Pertinent labs & imaging results that were available during my care of the patient were reviewed by me and considered in my medical decision making (see chart for details).     Discussed likely viral etiology given short duration of symptoms.  Patient was tested for COVID-19 and results are pending.  No indication for flu testing given patient has been symptomatic for more than 48 hours.  She was prescribed Tessalon to manage cough symptoms.  Recommend she use over-the-counter medications including Tylenol, ibuprofen, Mucinex, Flonase for symptom relief.  She was provided work excuse note.  Strict return precautions given to which patient expressed understanding.  Final Clinical Impressions(s) / UC Diagnoses   Final diagnoses:  Upper respiratory tract infection, unspecified type  Cough     Discharge Instructions     Take Tessalon  up to 3 times a day as needed for cough.  We have tested you for COVID-19 we will be in touch with these results once we have them.  Please remain in isolation until results are obtained.  Alternate Tylenol and ibuprofen to manage pain and fever.  Take Mucinex and Flonase for additional symptom management.  If anything worsens please return for reevaluation.    ED Prescriptions    Medication Sig Dispense Auth. Provider   benzonatate (TESSALON) 100 MG capsule Take 1 capsule (100 mg total) by mouth every 8 (eight) hours. 21 capsule Arshiya Jakes K, PA-C     PDMP not reviewed this encounter.   Jeani Hawking, PA-C 02/25/21 1610

## 2021-02-25 NOTE — ED Triage Notes (Signed)
Pt reports Sx's since Tuesday. Fever 102 Pt took Tylenol . Pt also has fatigue ,cough and diarrhea.

## 2021-02-25 NOTE — Discharge Instructions (Addendum)
Take Tessalon up to 3 times a day as needed for cough.  We have tested you for COVID-19 we will be in touch with these results once we have them.  Please remain in isolation until results are obtained.  Alternate Tylenol and ibuprofen to manage pain and fever.  Take Mucinex and Flonase for additional symptom management.  If anything worsens please return for reevaluation.

## 2021-02-26 ENCOUNTER — Other Ambulatory Visit: Payer: Self-pay | Admitting: Physician Assistant

## 2021-02-26 ENCOUNTER — Other Ambulatory Visit: Payer: Self-pay

## 2021-02-26 ENCOUNTER — Emergency Department (HOSPITAL_COMMUNITY)
Admission: EM | Admit: 2021-02-26 | Discharge: 2021-02-26 | Disposition: A | Discharge status: Home / Self Care | Payer: Medicaid Other | Attending: Emergency Medicine | Admitting: Emergency Medicine

## 2021-02-26 ENCOUNTER — Emergency Department (HOSPITAL_COMMUNITY): Payer: Medicaid Other

## 2021-02-26 ENCOUNTER — Encounter (HOSPITAL_COMMUNITY): Payer: Self-pay

## 2021-02-26 DIAGNOSIS — U071 COVID-19: Secondary | ICD-10-CM | POA: Diagnosis not present

## 2021-02-26 DIAGNOSIS — R6883 Chills (without fever): Secondary | ICD-10-CM | POA: Diagnosis not present

## 2021-02-26 DIAGNOSIS — R0602 Shortness of breath: Secondary | ICD-10-CM | POA: Diagnosis not present

## 2021-02-26 DIAGNOSIS — R430 Anosmia: Secondary | ICD-10-CM | POA: Diagnosis not present

## 2021-02-26 DIAGNOSIS — R079 Chest pain, unspecified: Secondary | ICD-10-CM | POA: Diagnosis not present

## 2021-02-26 DIAGNOSIS — R059 Cough, unspecified: Secondary | ICD-10-CM | POA: Diagnosis not present

## 2021-02-26 LAB — COMPREHENSIVE METABOLIC PANEL
ALT: 41 U/L (ref 0–44)
AST: 45 U/L — ABNORMAL HIGH (ref 15–41)
Albumin: 3.3 g/dL — ABNORMAL LOW (ref 3.5–5.0)
Alkaline Phosphatase: 74 U/L (ref 38–126)
Anion gap: 7 (ref 5–15)
BUN: 9 mg/dL (ref 6–20)
CO2: 24 mmol/L (ref 22–32)
Calcium: 8.6 mg/dL — ABNORMAL LOW (ref 8.9–10.3)
Chloride: 106 mmol/L (ref 98–111)
Creatinine, Ser: 0.88 mg/dL (ref 0.44–1.00)
GFR, Estimated: >60 mL/min (ref >60)
Glucose, Bld: 114 mg/dL — ABNORMAL HIGH (ref 70–99)
Potassium: 3.9 mmol/L (ref 3.5–5.1)
Sodium: 137 mmol/L (ref 135–145)
Total Bilirubin: 0.2 mg/dL — ABNORMAL LOW (ref 0.3–1.2)
Total Protein: 6.9 g/dL (ref 6.5–8.1)

## 2021-02-26 LAB — CBC
HCT: 37.3 % (ref 36.0–46.0)
Hemoglobin: 11.9 g/dL — ABNORMAL LOW (ref 12.0–15.0)
MCH: 29.3 pg (ref 26.0–34.0)
MCHC: 31.9 g/dL (ref 30.0–36.0)
MCV: 91.9 fL (ref 80.0–100.0)
Platelets: 209 10*3/uL (ref 150–400)
RBC: 4.06 MIL/uL (ref 3.87–5.11)
RDW: 14.1 % (ref 11.5–15.5)
WBC: 4 10*3/uL (ref 4.0–10.5)
nRBC: 0 % (ref 0.0–0.2)

## 2021-02-26 MED ORDER — SODIUM CHLORIDE 0.9 % IV BOLUS
500.0000 mL | Freq: Once | INTRAVENOUS | Status: AC
  Administered 2021-02-26: 500 mL via INTRAVENOUS

## 2021-02-26 MED ORDER — ACETAMINOPHEN 500 MG PO TABS
1000.0000 mg | ORAL_TABLET | Freq: Once | ORAL | Status: AC
  Administered 2021-02-26: 1000 mg via ORAL
  Filled 2021-02-26: qty 2

## 2021-02-26 NOTE — Discharge Instructions (Addendum)
  You were evaluated in the Emergency Department and after careful evaluation, we did not find any emergent condition requiring admission or further testing in the hospital.   Your exam/testing today was overall reassuring.  Symptoms seem to be due to COVID-19.  Your chest x-ray was clear.  I want you to take Tylenol scheduled every 6 hours for your pain in your chest.  Continue to take the Tessalon.  If you develop any shortness of breath or difficulty breathing or worsening chest pain then please come back to the ER, you can follow-up with your primary care over the next couple of days for symptom check.  If you do not have your primary care doctor you can go to the [facility] which specializes in COVID care. Please return to the Emergency Department if you experience any worsening of your condition.  Thank you for allowing [place] to be a part of your care. Please speak to your pharmacist about any new medications prescribed today in regards to side effects or interactions with other medications.

## 2021-02-26 NOTE — ED Provider Notes (Signed)
[facility]EMERGENCY DEPT Provider Note   CSN: [id number] Arrival date & time: 02/26/21  1046     History Chief Complaint  Patient presents with  . Covid Positive  . Cough    Nancy Young is a 35 y.o. female with pertinent medical history of obesity and recent diagnosis of COVID that presents to the emergency department today for persisting COVID symptoms.  Patient was diagnosed with COVID on Thursday, states that she started having symptoms on Monday/Tuesday.  Pt is day 5 of her symptoms and continues to have symptoms of feeling weak, cough, sleepiness, chills, diarrhea nausea, anosmia and shortness of breath.  Patient states that she came to the ED today because she continues to have the symptoms and that her chest hurts every time she coughs.  Chest does not hurt at other times.  Patient states that she continues to feel tired, has been taking Tylenol infrequently, last took Tylenol last night.  Patient states that she is generally healthy, does not take any medications daily.  Per chart review patient was prescribed Tessalon for symptom treatment.  No other complaints at this time.  Patient has been vaccinated against COVID.  HPI     Past Medical History:  Diagnosis Date  . BV (bacterial vaginosis)   . Obesity   . Vaginal discharge 07/27/2017  . Yeast infection     Patient Active Problem List   Diagnosis Date Noted  . Acute lateral meniscus tear of left knee 10/29/2020  . Perimeniscal cyst of left knee 10/29/2020  . Prediabetes 10/07/2020  . GERD (gastroesophageal reflux disease) 08/27/2020  . Abdominal pain in female 05/17/2020  . Left knee pain 12/02/2019  . Bilateral knee pain 09/23/2019  . Strain of left foot 07/17/2019  . Urinary frequency 24-May-2019  . Sudden death of family member 02/24/2019  . Carpal tunnel syndrome 12/31/2018  . Vaginal discharge 05/14/2018  . Health care maintenance 07/02/2017  . Obesity 05/22/2017  . STD  exposure 05/22/2017    Past Surgical History:  Procedure Laterality Date  . KNEE ARTHROSCOPY Left 12/30/2020  . LEEP  2013     OB History   No obstetric history on file.     Family History  Problem Relation Age of Onset  . Diabetes Mother   . Hypertension Mother   . Hyperlipidemia Mother   . Bipolar disorder Mother   . Diabetes Maternal Grandmother   . Sickle cell anemia Maternal Grandmother   . Heart attack Maternal Grandmother   . Anxiety disorder Maternal Grandmother   . Hypertension Maternal Grandmother   . Hyperlipidemia Maternal Grandmother   . Stroke Maternal Grandmother   . Hepatitis Maternal Grandfather   . Lung disease Maternal Grandfather   . Diabetes Paternal Grandmother   . Hypertension Paternal Grandmother   . Hyperlipidemia Paternal Grandmother   . Kidney disease Maternal Aunt   . Diabetes Maternal Aunt   . Hypertension Maternal Aunt   . Stomach cancer Other     Social History   Tobacco Use  . Smoking status: Never Smoker  . Smokeless tobacco: Never Used  Vaping Use  . Vaping Use: Never used  Substance Use Topics  . Alcohol use: Yes    Comment: 1-2 drinks per 2-3 times/week  . Drug use: No    Home Medications Prior to Admission medications   Medication Sig Start Date End Date Taking? Authorizing Provider  benzonatate (TESSALON) 100 MG capsule Take 1 capsule (100 mg total) by mouth every 8 (eight)  hours. 02/25/21   Raspet, Denny PeonErin K, PA-C  famotidine (PEPCID) 20 MG tablet TAKE 1 TABLET(20 MG) BY MOUTH TWICE DAILY 12/15/20   Towanda OctavePatel, Poonam, MD  ibuprofen (ADVIL) 800 MG tablet Take 1 tablet (800 mg total) by mouth every 8 (eight) hours as needed. 01/06/21   Cristie HemStanbery, Mary L, PA-C  ondansetron (ZOFRAN) 4 MG tablet Take 1 tablet (4 mg total) by mouth every 8 (eight) hours as needed for nausea or vomiting. 12/29/20   Cristie HemStanbery, Mary L, PA-C  phentermine (ADIPEX-P) 37.5 MG tablet Take 37.5 mg by mouth daily. 02/15/21   [provider]    Allergies     Patient has no known allergies.  Review of Systems   Review of Systems  Constitutional: Positive for chills and fatigue. Negative for diaphoresis and fever.  HENT: Negative for congestion, sore throat and trouble swallowing.   Eyes: Negative for pain and visual disturbance.  Respiratory: Positive for cough and shortness of breath. Negative for wheezing.   Cardiovascular: Positive for chest pain. Negative for palpitations and leg swelling.  Gastrointestinal: Positive for diarrhea, nausea and vomiting. Negative for abdominal distention and abdominal pain.  Genitourinary: Negative for difficulty urinating.  Musculoskeletal: Negative for back pain, neck pain and neck stiffness.  Skin: Negative for pallor.  Neurological: Negative for dizziness, speech difficulty, weakness and headaches.  Psychiatric/Behavioral: Negative for confusion.    Physical Exam Updated Vital Signs BP 131/81   Pulse 73   Temp 98.3 F (36.8 C) (Oral)   Resp 20   LMP 02/10/2021   SpO2 100%   Physical Exam Constitutional:      General: She is not in acute distress.    Appearance: Normal appearance. She is not ill-appearing, toxic-appearing or diaphoretic.     Comments: Sleepy, however arousable on exam and able to cooperate with exam.   HENT:     Mouth/Throat:     Mouth: Mucous membranes are moist.     Pharynx: Oropharynx is clear.  Eyes:     General: No scleral icterus.    Extraocular Movements: Extraocular movements intact.     Pupils: Pupils are equal, round, and reactive to light.  Cardiovascular:     Rate and Rhythm: Normal rate and regular rhythm.     Pulses: Normal pulses.     Heart sounds: Normal heart sounds.  Pulmonary:     Effort: Pulmonary effort is normal. No respiratory distress.     Breath sounds: Normal breath sounds. No stridor. No wheezing, rhonchi or rales.  Chest:     Chest wall: Tenderness present.       Comments: Patient with tenderness to sternum, no erythema or ecchymosis  noted.  No rashes.  No tenderness elsewhere. Abdominal:     General: Abdomen is flat. There is no distension.     Palpations: Abdomen is soft.     Tenderness: There is no abdominal tenderness. There is no guarding or rebound.  Musculoskeletal:        General: No swelling or tenderness. Normal range of motion.     Cervical back: Normal range of motion and neck supple. No rigidity.     Right lower leg: No edema.     Left lower leg: No edema.  Skin:    General: Skin is warm and dry.     Capillary Refill: Capillary refill takes less than 2 seconds.     Coloration: Skin is not pale.  Neurological:     General: No focal deficit present.  Mental Status: She is alert and oriented to person, place, and time.     Cranial Nerves: No cranial nerve deficit.     Sensory: No sensory deficit.     Motor: No weakness.     Coordination: Coordination normal.     Gait: Gait normal.  Psychiatric:        Mood and Affect: Mood normal.        Behavior: Behavior normal.     ED Results / Procedures / Treatments   Labs (all labs ordered are listed, but only abnormal results are displayed) Labs Reviewed  COMPREHENSIVE METABOLIC PANEL - Abnormal; Notable for the following components:      Result Value   Glucose, Bld 114 (*)    Calcium 8.6 (*)    Albumin 3.3 (*)    AST 45 (*)    Total Bilirubin 0.2 (*)    All other components within normal limits  CBC - Abnormal; Notable for the following components:   Hemoglobin 11.9 (*)    All other components within normal limits    EKG None  Radiology DG Chest Port 1 View  Result Date: 02/26/2021 CLINICAL DATA:  Cough, shortness of breath and chills. EXAM: PORTABLE CHEST 1 VIEW COMPARISON:  02/24/2017 FINDINGS: The cardiomediastinal silhouette is unremarkable. There is no evidence of focal airspace disease, pulmonary edema, suspicious pulmonary nodule/mass, pleural effusion, or pneumothorax. No acute bony abnormalities are identified. IMPRESSION: No active  disease. Electronically Signed   By: Harmon Pier M.D.   On: 02/26/2021 12:17    Procedures Procedures   Medications Ordered in ED Medications  acetaminophen (TYLENOL) tablet 1,000 mg (1,000 mg Oral Given 02/26/21 1209)  sodium chloride 0.9 % bolus 500 mL (0 mLs Intravenous Stopped 02/26/21 1400)    ED Course  I have reviewed the triage vital signs and the nursing notes.  Pertinent labs & imaging results that were available during my care of the patient were reviewed by me and considered in my medical decision making (see chart for details).    MDM Rules/Calculators/A&P                         Tamisha Nordstrom is a 35 y.o. female with pertinent medical history of obesity and recent diagnosis of COVID that presents to the emergency department today for persisting COVID symptoms.  Patient appears well, however sleepy on exam, is primarily here because she started having chest pain when she coughs.  Will obtain basic labs to check for laboratory derangements, chest x-ray and EKG for chest pain, however chest pain appears extremely musculoskeletal, is tender to palpation on exam.  Low concerns for ACS with this illness, patient is low risk, does not sound cardiac in nature.  Patient is PERC negative.We will give some fluids at this time and Tylenol.  Work-up today unremarkable, CBC and CMP without any major derangements.  CBC with hemoglobin 11.9 which appears normal for patient.  Chest x-ray interpreted me without any acute cardiopulmonary disease.  EKG interpreted by me without any signs of ischemia or arrhythmia, not crossing over at this time however evaluated by Dr. Effie Shy.  Upon ambulation patient remained above 100% on room air.  Upon reevaluation, patient appears less sleepy and more awake after fluid bolus administration.  Hemodynamically stable, patient to be discharged at this time, has been 5 days of symptoms and patient is without any comorbidities besides obesity, do not have the  patient would benefit from antivirals at  this time.  Symptomatic treatment discussed, patient will follow up with PCP.  Doubt need for further emergent work up at this time. I explained the diagnosis and have given explicit precautions to return to the ER including for any other new or worsening symptoms. The patient understands and accepts the medical plan as it's been dictated and I have answered their questions. Discharge instructions concerning home care and prescriptions have been given. The patient is STABLE and is discharged to home in good condition.  Final Clinical Impression(s) / ED Diagnoses Final diagnoses:  COVID    Rx / DC Orders ED Discharge Orders    None       Farrel Gordon, PA-C 02/26/21 1452    Mancel Bale, MD 02/27/21 [phone]

## 2021-02-26 NOTE — ED Triage Notes (Signed)
Pt states she began feeling weak, cough, SOB and chills  Wednesday, took home Covid test positive Thursday, went to Urgent Care on Friday also tested positive DC'd with cough RX and instructed to quarantine.

## 2021-02-28 ENCOUNTER — Telehealth: Payer: Self-pay | Admitting: *Deleted

## 2021-02-28 NOTE — Telephone Encounter (Signed)
Transition Care Management Follow-up Telephone Call  Date of discharge and from where: 02/26/2021 - [facility]  How have you been since you were released from the hospital? "Feeling the same"  Any questions or concerns? No  Items Reviewed:  Did the pt receive and understand the discharge instructions provided? Yes   Medications obtained and verified? Yes   Other? No   Any new allergies since your discharge? No   Dietary orders reviewed? No  Do you have support at home? No    Functional Questionnaire: (I = Independent and D = Dependent) ADLs: I  Bathing/Dressing- I  Meal Prep- I  Eating- I  Maintaining continence- I  Transferring/Ambulation- I  Managing Meds- I  Follow up appointments reviewed:   PCP Hospital f/u appt confirmed? No    Specialist Hospital f/u appt confirmed? No    Are transportation arrangements needed? No   If their condition worsens, is the pt aware to call PCP or go to the Emergency Dept.? Yes  Was the patient provided with contact information for the PCP's office or ED? Yes  Was to pt encouraged to call back with questions or concerns? Yes

## 2021-03-01 ENCOUNTER — Telehealth (INDEPENDENT_AMBULATORY_CARE_PROVIDER_SITE_OTHER): Payer: Medicaid Other | Admitting: Family Medicine

## 2021-03-01 ENCOUNTER — Encounter: Payer: Self-pay | Admitting: Family Medicine

## 2021-03-01 DIAGNOSIS — N760 Acute vaginitis: Secondary | ICD-10-CM | POA: Diagnosis not present

## 2021-03-01 DIAGNOSIS — B9689 Other specified bacterial agents as the cause of diseases classified elsewhere: Secondary | ICD-10-CM | POA: Diagnosis not present

## 2021-03-01 MED ORDER — METRONIDAZOLE 0.75 % VA GEL: 1.0000 | Freq: Every day | VAGINAL | 0 refills | Status: AC

## 2021-03-01 NOTE — Progress Notes (Signed)
[facility] Telemedicine Visit  Patient consented to have virtual visit and was identified by name and date of birth. Method of visit: Video was attempted but was interrupted: <50% of visit completed via video  Encounter participants: Patient: Nancy Young Record - located at home Provider: Westley Chandler - located at home Others (if applicable): none Chief Complaint: COVID and vaginal discharge   HPI: Nancy Young is a very pleasant 1 old woman with history significant for obesity presenting with ongoing symptoms of COVID and vaginal discharge.  Patient's COVID symptoms started last Thursday.  She was seen in the ER and diagnosed.  She was not given antiviral medications for this condition. She reports ongoing cough, congestion, nighttime symptoms. No chest pain, dyspnea, or fevers for > 24 hours. Her son has type 1 diabetes and is fully vaccinated.   The patient reports several days of malodorous vaginal discharge. Has a constant moist discharge since diagnosis. No new partners. Sexually active with females. No recent antibiotics. Last pap in 2020- NILM without cotesting.    ROS: per HPI  Pertinent PMHx:  Obesity   Exam:  LMP 02/10/2021   Respiratory: speaking in full sentences pleasant and appropriate   Assessment/Plan:  COVID19, patient improving, now on day 6 of symptoms, outside window for Paxlovid. Discussed symptomatic care and quarantine recommendations (should continue to war mask for 5 more days)  Vaginal discharge, discussed limitations of exam and findings--recommend in person evaluation. Rx for metrogel nightly for 5 days. Agrees to in person assessment if not improved.   Time spent during visit with patient: 11 minutes

## 2021-03-03 DIAGNOSIS — Z20828 Contact with and (suspected) exposure to other viral communicable diseases: Secondary | ICD-10-CM | POA: Diagnosis not present

## 2021-03-05 ENCOUNTER — Other Ambulatory Visit: Payer: Self-pay | Admitting: Physician Assistant

## 2021-03-12 ENCOUNTER — Other Ambulatory Visit: Payer: Self-pay | Admitting: Physician Assistant

## 2021-03-14 ENCOUNTER — Encounter: Payer: Self-pay | Admitting: Orthopaedic Surgery

## 2021-03-15 DIAGNOSIS — R635 Abnormal weight gain: Secondary | ICD-10-CM | POA: Diagnosis not present

## 2021-03-15 DIAGNOSIS — Z131 Encounter for screening for diabetes mellitus: Secondary | ICD-10-CM | POA: Diagnosis not present

## 2021-03-15 DIAGNOSIS — M25562 Pain in left knee: Secondary | ICD-10-CM | POA: Diagnosis not present

## 2021-03-16 ENCOUNTER — Other Ambulatory Visit: Payer: Self-pay

## 2021-03-16 MED ORDER — FAMOTIDINE 20 MG PO TABS: ORAL_TABLET | ORAL | 0 refills | Status: DC

## 2021-03-19 ENCOUNTER — Other Ambulatory Visit: Payer: Self-pay | Admitting: Physician Assistant

## 2021-03-22 ENCOUNTER — Ambulatory Visit: Payer: Medicaid Other | Admitting: Orthopaedic Surgery

## 2021-03-23 ENCOUNTER — Ambulatory Visit (INDEPENDENT_AMBULATORY_CARE_PROVIDER_SITE_OTHER): Payer: Medicaid Other | Admitting: Orthopaedic Surgery

## 2021-03-23 ENCOUNTER — Other Ambulatory Visit: Payer: Self-pay

## 2021-03-23 DIAGNOSIS — Z9889 Other specified postprocedural states: Secondary | ICD-10-CM

## 2021-03-23 NOTE — Progress Notes (Addendum)
Office Visit Note   Patient: Nancy Young           Date of Birth: March 12, 1986           MRN: [id number] Visit Date: 03/23/2021              Requested by: Nancy Octave, MD [address] [place],  [place] [address] PCP: Nancy Octave, MD   Assessment & Plan: Visit Diagnoses:  1. 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 We discussed risk factors for this condition including increased Q angle, valgus knee alignment and obesity. We recommended conservative treatment including physical therapy with K-taping, weight loss and activity modification (avoiding deep flexion, squats, stairs and prolonged sitting). Her PCP recently put her on Phentermine and she has lost 13 pounds. We discussed that there is a surgical option (lateral release) but this is a last line option when conservative treatment has been exhausted. Patient demonstrated a good understanding. We encouraged her to continue weight loss efforts and put in a referral to physical therapy. We will see her back as needed if symptoms worsen or fail to improve.   Follow-Up Instructions: Return if symptoms worsen or fail to improve.   Orders:  Orders Placed This Encounter  Procedures   Ambulatory referral to Physical Therapy   No orders of the defined types were placed in this encounter.     Procedures: No procedures performed   Clinical Data: No additional findings.   Subjective: Chief Complaint  Patient presents with   Left Knee - Pain    HPI Nancy Young is a 35 y.o. female who is following up for her left knee. She is 3 months status post a left knee arthroscopic debridement of the lateral meniscus. She went on to do well following this procedure, then about three weeks ago she developed painful catching  and locking over the lateral knee. She states it "feels like her bones are moving". She  notices this with standing and walking. She reports occasional swelling in the knee. She denies any new injuries. She has been icing her knee and taking Motrin. She completed physical therapy after her knee scope but none recently. Of note, during surgery it was noted that she had a laterally tracking patella  Review of Systems Review of Systems was reviewed and negative unless as stated in the HPI.  Objective: Vital Signs: There were no vitals taken for this visit.  Physical Exam  Ortho Exam Left knee with well healed portal incisions. Genu valgum alignment.  Increased Q angle.  Mild patellofemoral crepitus on the lateral side.  No effusion. Range of motion 0-130 degrees with pain at endpoint flexion. Tender to palpation over the lateral patellar facet. Stable ligamentous exam. Negative McMurray. Distal neurovascular exam intact.   Specialty Comments:  No specialty comments available.  Imaging: No new imaging.  PMFS History: Patient Active Problem List   Diagnosis Date Noted   Acute lateral meniscus tear of left knee 10/29/2020   Perimeniscal cyst of left knee 10/29/2020   Prediabetes 10/07/2020   GERD (gastroesophageal reflux disease) 08/27/2020   Abdominal pain in female 05/17/2020   Left knee pain 12/02/2019   Bilateral knee pain 09/23/2019   Strain of left foot 07/17/2019   Urinary frequency May 24, 2019   Sudden death of family member 24-Feb-2019   Carpal tunnel syndrome 12/31/2018   Vaginal discharge 05/14/2018   Health care maintenance  07/02/2017   Obesity 05/22/2017   STD exposure 05/22/2017   Past Medical History:  Diagnosis Date   BV (bacterial vaginosis)    Obesity    Vaginal discharge 07/27/2017   Yeast infection     Family History  Problem Relation Age of Onset   Diabetes Mother    Hypertension Mother    Hyperlipidemia Mother    Bipolar disorder Mother    Diabetes Maternal Grandmother    Sickle cell anemia Maternal Grandmother    Heart attack Maternal  Grandmother    Anxiety disorder Maternal Grandmother    Hypertension Maternal Grandmother    Hyperlipidemia Maternal Grandmother    Stroke Maternal Grandmother    Hepatitis Maternal Grandfather    Lung disease Maternal Grandfather    Diabetes Paternal Grandmother    Hypertension Paternal Grandmother    Hyperlipidemia Paternal Grandmother    Kidney disease Maternal Aunt    Diabetes Maternal Aunt    Hypertension Maternal Aunt    Stomach cancer Other     Past Surgical History:  Procedure Laterality Date   KNEE ARTHROSCOPY Left 12/30/2020   LEEP  2013   Social History   Occupational History   Occupation: CNA  Tobacco Use   Smoking status: Never   Smokeless tobacco: Never  Vaping Use   Vaping Use: Never used  Substance and Sexual Activity   Alcohol use: Yes    Comment: 1-2 drinks per 2-3 times/week   Drug use: No   Sexual activity: Yes    Partners: Female    Birth control/protection: None

## 2021-03-25 ENCOUNTER — Other Ambulatory Visit: Payer: Self-pay

## 2021-03-25 ENCOUNTER — Ambulatory Visit (INDEPENDENT_AMBULATORY_CARE_PROVIDER_SITE_OTHER): Payer: Medicaid Other | Admitting: Family Medicine

## 2021-03-25 VITALS — BP 118/62 | Wt 223.0 lb

## 2021-03-25 DIAGNOSIS — N912 Amenorrhea, unspecified: Secondary | ICD-10-CM | POA: Diagnosis not present

## 2021-03-25 DIAGNOSIS — N926 Irregular menstruation, unspecified: Secondary | ICD-10-CM | POA: Diagnosis not present

## 2021-03-25 LAB — POCT URINE PREGNANCY: Preg Test, Ur: NEGATIVE

## 2021-03-25 NOTE — Patient Instructions (Signed)
Dear Nancy Young,   Today we discussed the following:   Missed periods  Getting some labs today  Continue to track symptoms and any periods   For any labs obtained today, I will send results via MyChart.  If anything is abnormal, we will reach out to you for details on further management.   Be well,   Dr. Selena Batten  ===================================================================================

## 2021-03-25 NOTE — Progress Notes (Signed)
    SUBJECTIVE:   CHIEF COMPLAINT / HPI:   Abdominal Pain/Bloating  Missed period by 50 days. Has had hx of missed periods. Cycle previously regular, but became irregular in 2021. Periods last for 3-4 days. Periods recently not heavy, but always light periods for life. She does note cramping and pain with menstrual cycles. She reports a few years back before her son was born (he will be 13 this year), had LEEP for abnormal pap.  Patient denies any history of hirsutism, she has had a recent weight loss of 10 pounds which was intentional, no changes in diet or exercise habits.  She is not taking any antipsychotics, metoclopramide, contraception.  She denies any headaches, visual field defects.  Medications: famotidine, phentermine (since March 2022)  Also follows with [facility] - following there for weight loss mainly. Primary care is here.   PERTINENT  PMH / PSH: Hx of LEEP  OBJECTIVE:   BP 118/62   Wt 223 lb (101.2 kg)   LMP  (LMP Unknown)   BMI 39.50 kg/m   General: NAD, non-toxic, well-appearing, sitting comfortably in chair. Obese, no hirsuit features.  HEENT: [place]/AT. PERRLA. EOMI. Visual fields intact. CN intact Cardiovascular: RRR Respiratory: CTAB. No IWOB.  Abdomen: + BS. NT, ND, soft to palpation.  Extremities: Warm and well perfused. Moving spontaneously.   Normal pelvic US in 2019.  Negative UPT today   ASSESSMENT/PLAN:   Amenorrhea UPT negative.  Patient does not have any hirsute features on exam.  She has had a 10 pound weight loss since starting phentermine in March, which is unlikely the cause of her irregular periods over the last year.  She does not have any physical exam findings concerning for brain mass.  Recent A1c's show prediabetes. Her BMi  Is 39.5 which could contribute.  Will obtain labs today (TSH, FSH, prolactin, estradiol).  Melene Plan, MD [facility]

## 2021-03-25 NOTE — Assessment & Plan Note (Addendum)
UPT negative.  Patient does not have any hirsute features on exam.  She has had a 10 pound weight loss since starting phentermine in March, which is unlikely the cause of her irregular periods over the last year.  She does not have any physical exam findings concerning for brain mass.  Recent A1c's show prediabetes. Her BMi  Is 39.5 which could contribute.  Will obtain labs today (TSH, FSH, prolactin, estradiol).

## 2021-03-26 LAB — PROLACTIN: Prolactin: 18.5 ng/mL (ref 4.8–23.3)

## 2021-03-26 LAB — ESTRADIOL: Estradiol: 28.1 pg/mL

## 2021-03-26 LAB — FOLLICLE STIMULATING HORMONE: FSH: 2.6 m[IU]/mL

## 2021-03-26 LAB — TSH: TSH: 0.917 u[IU]/mL (ref 0.450–4.500)

## 2021-03-28 NOTE — Progress Notes (Signed)
Initial work up for 2' causes of amenorrhea negative. Discussed continuing to monitor with patient and track periods and any symptoms closely. She is agreeable. Follow up in 6 months.

## 2021-04-21 DIAGNOSIS — M25562 Pain in left knee: Secondary | ICD-10-CM | POA: Diagnosis not present

## 2021-05-12 DIAGNOSIS — M25562 Pain in left knee: Secondary | ICD-10-CM | POA: Diagnosis not present

## 2021-05-19 DIAGNOSIS — N760 Acute vaginitis: Secondary | ICD-10-CM | POA: Diagnosis not present

## 2021-05-19 DIAGNOSIS — R635 Abnormal weight gain: Secondary | ICD-10-CM | POA: Diagnosis not present

## 2021-05-19 DIAGNOSIS — K219 Gastro-esophageal reflux disease without esophagitis: Secondary | ICD-10-CM | POA: Diagnosis not present

## 2021-05-23 DIAGNOSIS — M25562 Pain in left knee: Secondary | ICD-10-CM | POA: Diagnosis not present

## 2021-05-24 ENCOUNTER — Ambulatory Visit: Payer: Medicaid Other

## 2021-05-27 ENCOUNTER — Telehealth: Payer: Self-pay | Admitting: Family Medicine

## 2021-05-27 NOTE — Telephone Encounter (Signed)
..   Medicaid Managed Care   Unsuccessful Outreach Note  05/27/2021 Name: Nancy Young MRN: [id number] DOB: 1986/08/18  Referred by: Towanda Octave, MD Reason for referral : High Risk Managed Medicaid and Appointment (I called the patient today to offer her a phone appt with the [facility] Team. I left my name and number on her VM.)   An unsuccessful telephone outreach was attempted today. The patient was referred to the case management team for assistance with care management and care coordination.   Follow Up Plan: The care management team will reach out to the patient again over the next 7-14 days.   Weston Settle Care Guide, High Risk Medicaid Managed Care Embedded Care Coordination [facility]  [facility]

## 2021-05-28 ENCOUNTER — Encounter: Payer: Self-pay | Admitting: Family Medicine

## 2021-05-28 ENCOUNTER — Encounter: Payer: Self-pay | Admitting: Orthopaedic Surgery

## 2021-05-30 ENCOUNTER — Telehealth: Payer: Self-pay | Admitting: *Deleted

## 2021-05-30 NOTE — Telephone Encounter (Signed)
Received request from [facility] for metronidazole, did not see on patients current med list. Nancy Young, CMA

## 2021-05-31 NOTE — Telephone Encounter (Signed)
Sure that's fine to provide her with the letter.  Thanks.

## 2021-06-01 NOTE — Telephone Encounter (Signed)
Please ask the pt whether she needs this medication. If she thinks she has BV she should come in for swab. Thank you!

## 2021-06-02 DIAGNOSIS — Z03818 Encounter for observation for suspected exposure to other biological agents ruled out: Secondary | ICD-10-CM | POA: Diagnosis not present

## 2021-06-06 NOTE — Telephone Encounter (Signed)
Pt will call to schedule an appointment.Jurline Folger Zimmerman Rumple, CMA

## 2021-06-30 ENCOUNTER — Other Ambulatory Visit: Payer: Self-pay

## 2021-06-30 ENCOUNTER — Ambulatory Visit (HOSPITAL_COMMUNITY)
Admission: EM | Admit: 2021-06-30 | Discharge: 2021-06-30 | Disposition: A | Discharge status: Home / Self Care | Payer: Medicaid Other | Attending: Emergency Medicine | Admitting: Emergency Medicine

## 2021-06-30 ENCOUNTER — Encounter (HOSPITAL_COMMUNITY): Payer: Self-pay

## 2021-06-30 DIAGNOSIS — H66002 Acute suppurative otitis media without spontaneous rupture of ear drum, left ear: Secondary | ICD-10-CM | POA: Diagnosis not present

## 2021-06-30 MED ORDER — AMOXICILLIN 500 MG PO CAPS: 500.0000 mg | ORAL_CAPSULE | Freq: Two times a day (BID) | ORAL | 0 refills | Status: AC

## 2021-06-30 NOTE — ED Triage Notes (Signed)
Pt reports right ear pain x 1 day.  

## 2021-06-30 NOTE — ED Provider Notes (Signed)
[facility]    CSN: [id number] Arrival date & time: 06/30/21  0806      History   Chief Complaint Chief Complaint  Patient presents with   Otalgia    HPI Nancy Young is a 35 y.o. female.   Patient here for evaluation of left ear pain that has been ongoing for the past 2 days.  Reports history of ear infections and states symptoms are similar.  Reports that she recently had nasal congestion and cold-like symptoms that have since resolved.  Has not taken any OTC medication or treatment.  Denies any trauma, injury, or other precipitating event.  Denies any specific alleviating or aggravating factors.  Denies any fevers, chest pain, shortness of breath, N/V/D, numbness, tingling, weakness, abdominal pain, or headaches.    The history is provided by the patient.  Otalgia  Past Medical History:  Diagnosis Date   BV (bacterial vaginosis)    Obesity    Vaginal discharge 07/27/2017   Yeast infection     Patient Active Problem List   Diagnosis Date Noted   Amenorrhea 03/25/2021   Acute lateral meniscus tear of left knee 10/29/2020   Perimeniscal cyst of left knee 10/29/2020   Prediabetes 10/07/2020   GERD (gastroesophageal reflux disease) 08/27/2020   Abdominal pain in female 05/17/2020   Left knee pain 12/02/2019   Bilateral knee pain 09/23/2019   Strain of left foot 07/17/2019   Urinary frequency 06-11-19   Sudden death of family member 03-14-19   Carpal tunnel syndrome 12/31/2018   Vaginal discharge 05/14/2018   Health care maintenance 07/02/2017   Obesity 05/22/2017   STD exposure 05/22/2017    Past Surgical History:  Procedure Laterality Date   KNEE ARTHROSCOPY Left 12/30/2020   LEEP  2013    OB History   No obstetric history on file.      Home Medications    Prior to Admission medications   Medication Sig Start Date End Date Taking? Authorizing Provider  amoxicillin (AMOXIL) 500 MG capsule Take 1 capsule (500 mg total) by mouth  2 (two) times daily for 7 days. 06/30/21 07/07/21 Yes Ivette Loyal, NP  famotidine (PEPCID) 20 MG tablet TAKE 1 TABLET(20 MG) BY MOUTH TWICE DAILY 03/16/21   Towanda Octave, MD  phentermine (ADIPEX-P) 37.5 MG tablet Take 37.5 mg by mouth daily. 02/15/21   [provider]    Family History Family History  Problem Relation Age of Onset   Diabetes Mother    Hypertension Mother    Hyperlipidemia Mother    Bipolar disorder Mother    Diabetes Maternal Grandmother    Sickle cell anemia Maternal Grandmother    Heart attack Maternal Grandmother    Anxiety disorder Maternal Grandmother    Hypertension Maternal Grandmother    Hyperlipidemia Maternal Grandmother    Stroke Maternal Grandmother    Hepatitis Maternal Grandfather    Lung disease Maternal Grandfather    Diabetes Paternal Grandmother    Hypertension Paternal Grandmother    Hyperlipidemia Paternal Grandmother    Kidney disease Maternal Aunt    Diabetes Maternal Aunt    Hypertension Maternal Aunt    Stomach cancer Other     Social History Social History   Tobacco Use   Smoking status: Never   Smokeless tobacco: Never  Vaping Use   Vaping Use: Never used  Substance Use Topics   Alcohol use: Yes    Comment: 1-2 drinks per 2-3 times/week   Drug use: No  Allergies   Patient has no known allergies.   Review of Systems Review of Systems  HENT:  Positive for ear pain.   All other systems reviewed and are negative.   Physical Exam Triage Vital Signs ED Triage Vitals  Enc Vitals Group     BP 06/30/21 0833 101/61     Pulse Rate 06/30/21 0833 96     Resp 06/30/21 0833 18     Temp 06/30/21 0833 98.2 F (36.8 C)     Temp Source 06/30/21 0833 Oral     SpO2 06/30/21 0833 98 %     Weight --      Height --      Head Circumference --      Peak Flow --      Pain Score 06/30/21 0832 10     Pain Loc --      Pain Edu? --      Excl. in GC? --    No data found.  Updated Vital Signs BP 101/61 (BP Location:  Left Arm)   Pulse 96   Temp 98.2 F (36.8 C) (Oral)   Resp 18   SpO2 98%   Visual Acuity Right Eye Distance:   Left Eye Distance:   Bilateral Distance:    Right Eye Near:   Left Eye Near:    Bilateral Near:     Physical Exam Vitals and nursing note reviewed.  Constitutional:      General: She is not in acute distress.    Appearance: Normal appearance. She is not ill-appearing, toxic-appearing or diaphoretic.  HENT:     Head: Normocephalic and atraumatic.     Right Ear: Tympanic membrane, ear canal and external ear normal.     Left Ear: Swelling and tenderness present. Tympanic membrane is erythematous and bulging.  Eyes:     Conjunctiva/sclera: Conjunctivae normal.  Cardiovascular:     Rate and Rhythm: Normal rate.     Pulses: Normal pulses.  Pulmonary:     Effort: Pulmonary effort is normal.  Abdominal:     General: Abdomen is flat.  Musculoskeletal:        General: Normal range of motion.     Cervical back: Normal range of motion.  Skin:    General: Skin is warm and dry.  Neurological:     General: No focal deficit present.     Mental Status: She is alert and oriented to person, place, and time.  Psychiatric:        Mood and Affect: Mood normal.     UC Treatments / Results  Labs (all labs ordered are listed, but only abnormal results are displayed) Labs Reviewed - No data to display  EKG   Radiology No results found.  Procedures Procedures (including critical care time)  Medications Ordered in UC Medications - No data to display  Initial Impression / Assessment and Plan / UC Course  I have reviewed the triage vital signs and the nursing notes.  Pertinent labs & imaging results that were available during my care of the patient were reviewed by me and considered in my medical decision making (see chart for details).    Assessment negative for red flags or concerns.  Likely otitis media of the left ear.  Will treat with amoxicillin twice daily for  the next 7 days.  Tylenol and/or ibuprofen as needed.  Encourage fluids and rest.  Follow-up as needed Final Clinical Impressions(s) / UC Diagnoses   Final diagnoses:  Non-recurrent acute suppurative  otitis media of left ear without spontaneous rupture of tympanic membrane     Discharge Instructions      Take the amoxicillin one pill twice a day for the next 7 days.   You can take Tylenol and/or Ibuprofen as needed for pain relief and fever reduction.   Return or go to the Emergency Department if symptoms worsen or do not improve in the next few days.      ED Prescriptions     Medication Sig Dispense Auth. Provider   amoxicillin (AMOXIL) 500 MG capsule Take 1 capsule (500 mg total) by mouth 2 (two) times daily for 7 days. 14 capsule Ivette Loyal, NP      PDMP not reviewed this encounter.   Ivette Loyal, NP 06/30/21 [phone]

## 2021-06-30 NOTE — Discharge Instructions (Signed)
Take the amoxicillin one pill twice a day for the next 7 days.   You can take Tylenol and/or Ibuprofen as needed for pain relief and fever reduction.   Return or go to the Emergency Department if symptoms worsen or do not improve in the next few days.

## 2021-07-17 NOTE — Progress Notes (Signed)
    SUBJECTIVE:   CHIEF COMPLAINT / HPI:   Concern for Yeast Infection Nancy Young is a 35 y.o. female who presents today due to concerns for development of yeast infection. She states she was recently on amoxicillin for an ear infection, believes this to be the cause.  She typically gets yeast infections after antibiotics, amoxicillin specifically.  She is sexually active with 1 female partner, does not think she has any sexually transmitted infections but would like to be tested to be sure.   Health Maintenance Due for Pap smear in January 2023.  Offered Pap smear today, patient is amenable.  We will do cotesting with HPV.  Desires Flu Vaccine Patient desires flu vaccination today.   PERTINENT  PMH / PSH:  Past Medical History:  Diagnosis Date   BV (bacterial vaginosis)    Obesity    Vaginal discharge 07/27/2017   Yeast infection     OBJECTIVE:   BP 130/80   Pulse 84   Ht 5\' 3"  (1.6 m)   Wt 219 lb 12.8 oz (99.7 kg)   BMI 38.94 kg/m    General: NAD, pleasant, able to participate in exam Respiratory: Breathing comfortably on room air, no respiratory distress Abdomen: Overweight GU: Normal appearance of labia majora and minora, without lesions. Vagina tissue pink, moist, without lesions or abrasions. Cervix normal appearance, non-friable, there is white clumpy discharge in vaginal vault Psych: Normal affect and mood  GU exam chaperoned by CMA Shelly  ASSESSMENT/PLAN:   Vaginal discharge Vaginal discharge that started last week after completion of antibiotics.  Examination notable for white clumpy discharge in vaginal vault consistent with Candida.  Treated empirically with Diflucan.  STI testing for chlamydia, gonorrhea, trichomonas, hepatitis B, HIV, RPR also done.  We will contact patient with the results.  No concern for pregnancy as she is sexually active with a female partner.  Health care maintenance Pap test with cotesting performed today. Will follow  up on the results.  She received her flu shot today.    , DO [place] [facility]

## 2021-07-17 NOTE — Patient Instructions (Addendum)
It was wonderful to see you today.  Please bring ALL of your medications with you to every visit.   Today we talked about:  -You received your Flu vaccine today. -I am sending a prescription for diflucan to your pharmacy.  -We are doing test today to check for sexually transmitted infections including gonorrhea chlamydia, trichomonas, hepatitis B, HIV, and syphilis.  I will contact you with the results.  Thank you for choosing [facility].   Please call [phone] with any questions about today's appointment.  Please be sure to schedule follow up at the front  desk before you leave today.   Sabino Dick, DO PGY-2 Family Medicine

## 2021-07-19 ENCOUNTER — Other Ambulatory Visit (HOSPITAL_COMMUNITY)
Admission: RE | Admit: 2021-07-19 | Discharge: 2021-07-19 | Disposition: A | Discharge status: Home / Self Care | Payer: Medicaid Other | Source: Ambulatory Visit | Attending: Family Medicine | Admitting: Family Medicine

## 2021-07-19 ENCOUNTER — Ambulatory Visit (INDEPENDENT_AMBULATORY_CARE_PROVIDER_SITE_OTHER): Payer: Medicaid Other | Admitting: Family Medicine

## 2021-07-19 ENCOUNTER — Other Ambulatory Visit: Payer: Self-pay

## 2021-07-19 VITALS — BP 130/80 | HR 84 | Ht 63.0 in | Wt 219.8 lb

## 2021-07-19 DIAGNOSIS — Z113 Encounter for screening for infections with a predominantly sexual mode of transmission: Secondary | ICD-10-CM | POA: Diagnosis not present

## 2021-07-19 DIAGNOSIS — N898 Other specified noninflammatory disorders of vagina: Secondary | ICD-10-CM | POA: Diagnosis not present

## 2021-07-19 DIAGNOSIS — Z23 Encounter for immunization: Secondary | ICD-10-CM

## 2021-07-19 DIAGNOSIS — Z0000 Encounter for general adult medical examination without abnormal findings: Secondary | ICD-10-CM | POA: Diagnosis present

## 2021-07-19 DIAGNOSIS — Z124 Encounter for screening for malignant neoplasm of cervix: Secondary | ICD-10-CM | POA: Diagnosis not present

## 2021-07-19 MED ORDER — FLUCONAZOLE 150 MG PO TABS: 150.0000 mg | ORAL_TABLET | Freq: Once | ORAL | 0 refills | Status: AC

## 2021-07-19 NOTE — Assessment & Plan Note (Addendum)
Pap test with cotesting performed today. Will follow up on the results.  She received her flu shot today.

## 2021-07-19 NOTE — Assessment & Plan Note (Signed)
Vaginal discharge that started last week after completion of antibiotics.  Examination notable for white clumpy discharge in vaginal vault consistent with Candida.  Treated empirically with Diflucan.  STI testing for chlamydia, gonorrhea, trichomonas, hepatitis B, HIV, RPR also done.  We will contact patient with the results.  No concern for pregnancy as she is sexually active with a female partner.

## 2021-07-20 LAB — CERVICOVAGINAL ANCILLARY ONLY
Bacterial Vaginitis (gardnerella): NEGATIVE
Candida Glabrata: NEGATIVE
Candida Vaginitis: NEGATIVE
Chlamydia: NEGATIVE
Comment: NEGATIVE
Comment: NEGATIVE
Comment: NEGATIVE
Comment: NEGATIVE
Comment: NEGATIVE
Comment: NORMAL
Neisseria Gonorrhea: NEGATIVE
Trichomonas: NEGATIVE

## 2021-07-20 LAB — CYTOLOGY - PAP
Comment: NEGATIVE
Diagnosis: NEGATIVE
High risk HPV: NEGATIVE

## 2021-07-20 LAB — HIV ANTIBODY (ROUTINE TESTING W REFLEX): HIV Screen 4th Generation wRfx: NONREACTIVE

## 2021-07-20 LAB — HEPATITIS B SURFACE ANTIGEN: Hepatitis B Surface Ag: NEGATIVE

## 2021-07-20 LAB — RPR: RPR Ser Ql: NONREACTIVE

## 2021-08-09 ENCOUNTER — Telehealth: Payer: Self-pay | Admitting: *Deleted

## 2021-08-09 ENCOUNTER — Ambulatory Visit: Payer: Self-pay

## 2021-08-09 NOTE — Patient Instructions (Signed)
Mardene Celeste ,   The Medicaid Managed Care Team is available to provide assistance to you with your healthcare needs at no cost and as a benefit of your [facility] plan. I'm sorry I was unable to reach you today for our scheduled appointment. Our care guide will call you to reschedule our telephone appointment. Please call me at the number below. I am available to be of assistance to you regarding your healthcare needs. .   Thank you,   Cranford Mon RN, CCM, CDCES [place]  [facility] Care Management Coordinator - Managed [place] High Risk [phone]

## 2021-08-09 NOTE — Patient Outreach (Signed)
Care Coordination  08/09/2021  Nancy Young December 14, 1985 [id number]  08/09/2021 Name: Nancy Young MRN: [id number] DOB: 11-05-1985  Referred by: Towanda Octave, MD Reason for referral : High Risk Managed Medicaid (Unsuccessful Initial RNCM telephone outreach)   An unsuccessful telephone outreach was attempted today. The patient was referred to the case management team for assistance with care management and care coordination.    Follow Up Plan: A HIPAA compliant phone message was left for the patient providing contact information and requesting a return call. and The Managed Medicaid care management team will reach out to the patient again over the next 7 days.    Cranford Mon RN, CCM, CDCES [place]  [facility] Care Management Coordinator - Managed [place] High Risk [phone]

## 2021-08-13 ENCOUNTER — Other Ambulatory Visit: Payer: Self-pay | Admitting: Family Medicine

## 2021-08-15 ENCOUNTER — Other Ambulatory Visit: Payer: Self-pay | Admitting: Family Medicine

## 2021-08-18 MED ORDER — FAMOTIDINE 20 MG PO TABS: ORAL_TABLET | ORAL | 0 refills | Status: DC

## 2021-08-23 ENCOUNTER — Encounter: Payer: Self-pay | Admitting: Family Medicine

## 2021-08-30 NOTE — Progress Notes (Signed)
    SUBJECTIVE:   CHIEF COMPLAINT / HPI:   Vaginal Discharge: Patient is a 35 y.o. female presenting with vaginal discharge for 2 weeks.  She states the discharge is of thick whitish consistency.  She endorses no vaginal itching or pain.  She states she often gets BV.  She is in a monogamous relationship with a female partner and is not interested in STD screening today.  She has no concerns for pregnancy.  Heart burn: Has been getting it for about a year, mostly right after meals. She tried pepcid without a lot of benefit, has not tried other meds. No unexpected weight loss or red flag symptoms.  She states she only drinks alcohol on rare occasion and cannot remember the last time she had a drink.  She states that most meals cause her some reflux symptoms that initially she got some benefit from the Pepcid but not lately.  PERTINENT  PMH / PSH: None relevant  OBJECTIVE:   BP 131/85   Pulse (!) 101   Wt 220 lb (99.8 kg)   LMP 08/09/2021   SpO2 100%   BMI 38.97 kg/m    General: NAD, pleasant, able to participate in exam Respiratory: Normal effort, no obvious respiratory distress Pelvic: VULVA: normal appearing vulva with no masses, tenderness or lesions, VAGINA: Normal appearing vagina with normal color, no lesions, with scant and white discharge present. Chaperone Clabe Seal present for pelvic exam  ASSESSMENT/PLAN:   GERD (gastroesophageal reflux disease) Reflux symptoms after each meal.  She previously tried famotidine and got some benefit but has not been getting much as of late.  No red flag symptoms, no unintentional weight loss.  Discussed avoiding lying down immediately after meals as well as trying to avoid any trigger foods.  We will start with omeprazole 20 mg daily.  We will trial this for 8 weeks.  Can consider increasing the dose.  After 8 weeks if she is still experiencing symptoms can trial as needed dosing versus referral to GI   Vaginal discharge: 35 y.o. female with  vaginal discharge 2 weeks.  Physical exam significant for whitish discharge.  Wet prep was performed which showed no clue cells, no signs of trichomoniasis or BV, no yeast seen.  Based off of her symptoms and physical exam with some thicker whitish discharge present we will treat for presumed yeast infection..  She is in a monogamous relationship with female partner and is not interested in STD screening.  She has no concern for pregnancy. Plan: -Wet prep as above.  Will treat with Diflucan. -Follow-up as needed   Jackelyn Poling, DO [facility]

## 2021-08-31 ENCOUNTER — Ambulatory Visit (INDEPENDENT_AMBULATORY_CARE_PROVIDER_SITE_OTHER): Payer: Medicaid Other | Admitting: Family Medicine

## 2021-08-31 ENCOUNTER — Other Ambulatory Visit: Payer: Self-pay

## 2021-08-31 VITALS — BP 131/85 | HR 101 | Wt 220.0 lb

## 2021-08-31 DIAGNOSIS — K219 Gastro-esophageal reflux disease without esophagitis: Secondary | ICD-10-CM

## 2021-08-31 DIAGNOSIS — N898 Other specified noninflammatory disorders of vagina: Secondary | ICD-10-CM

## 2021-08-31 LAB — POCT WET PREP (WET MOUNT)
Clue Cells Wet Prep Whiff POC: NEGATIVE
Trichomonas Wet Prep HPF POC: ABSENT

## 2021-08-31 MED ORDER — FLUCONAZOLE 150 MG PO TABS: 150.0000 mg | ORAL_TABLET | Freq: Once | ORAL | 0 refills | Status: AC

## 2021-08-31 MED ORDER — OMEPRAZOLE 20 MG PO CPDR: 20.0000 mg | DELAYED_RELEASE_CAPSULE | Freq: Every day | ORAL | 3 refills | Status: DC

## 2021-08-31 NOTE — Assessment & Plan Note (Addendum)
Reflux symptoms after each meal.  She previously tried famotidine and got some benefit but has not been getting much as of late.  No red flag symptoms, no unintentional weight loss.  Discussed avoiding lying down immediately after meals as well as trying to avoid any trigger foods.  She only drinks alcohol on rare occasion and states she can't remember the last time she had a drink so unlikely that alcohol is playing a role in symptoms. We will start with omeprazole 20 mg daily.  We will trial this for 8 weeks.  Can consider increasing the dose.  After 8 weeks if she is still experiencing symptoms can trial as needed dosing versus referral to GI

## 2021-08-31 NOTE — Patient Instructions (Signed)
I am prescribing a new medication for your reflux symptoms called omeprazole.  You can take this along with the famotidine.  I would like for you to try this daily for at least 8 weeks to see how your symptoms are doing.  Once 8 weeks occurs we can try to scale back to see if your symptoms have fully resolved.  Follow-up with me if this does not help control your symptoms as we can always increase the dose.  If you develop any other concerns please do not hesitate to reach out.  Your wet prep did not show obvious signs of yeast and did not show BV or Trichomonas.  Because of your symptoms, and the discharge seen on physical exam I think yeast is actually a more likely cause and so we will treat with Diflucan.  You can take 1 pill and this should resolve your symptoms.  I have sent a second pill that you can take 3 days later if your symptoms do not fully resolve.  Follow-up as needed.

## 2021-09-24 ENCOUNTER — Other Ambulatory Visit: Payer: Self-pay | Admitting: Family Medicine

## 2021-09-26 DIAGNOSIS — J111 Influenza due to unidentified influenza virus with other respiratory manifestations: Secondary | ICD-10-CM | POA: Diagnosis not present

## 2021-09-29 ENCOUNTER — Ambulatory Visit (INDEPENDENT_AMBULATORY_CARE_PROVIDER_SITE_OTHER): Payer: Medicaid Other

## 2021-09-29 ENCOUNTER — Encounter: Payer: Self-pay | Admitting: Orthopaedic Surgery

## 2021-09-29 ENCOUNTER — Ambulatory Visit: Payer: Self-pay

## 2021-09-29 ENCOUNTER — Other Ambulatory Visit: Payer: Self-pay

## 2021-09-29 ENCOUNTER — Ambulatory Visit (INDEPENDENT_AMBULATORY_CARE_PROVIDER_SITE_OTHER): Payer: Medicaid Other | Admitting: Orthopaedic Surgery

## 2021-09-29 VITALS — Ht 63.0 in | Wt 219.0 lb

## 2021-09-29 DIAGNOSIS — G8929 Other chronic pain: Secondary | ICD-10-CM

## 2021-09-29 DIAGNOSIS — M25562 Pain in left knee: Secondary | ICD-10-CM

## 2021-09-29 DIAGNOSIS — M25561 Pain in right knee: Secondary | ICD-10-CM | POA: Diagnosis not present

## 2021-09-29 MED ORDER — BUPIVACAINE HCL 0.25 % IJ SOLN
0.6600 mL | INTRAMUSCULAR | Status: AC | PRN
  Administered 2021-09-29: 09:00:00 .66 mL via INTRA_ARTICULAR

## 2021-09-29 MED ORDER — LIDOCAINE HCL 1 % IJ SOLN
3.0000 mL | INTRAMUSCULAR | Status: AC | PRN
  Administered 2021-09-29: 09:00:00 3 mL

## 2021-09-29 MED ORDER — METHYLPREDNISOLONE ACETATE 40 MG/ML IJ SUSP
13.3300 mg | INTRAMUSCULAR | Status: AC | PRN
  Administered 2021-09-29: 09:00:00 13.33 mg via INTRA_ARTICULAR

## 2021-09-29 MED ORDER — METHYLPREDNISOLONE ACETATE 40 MG/ML IJ SUSP
13.3300 mg | INTRAMUSCULAR | Status: AC | PRN
Start: 2021-09-29 — End: 2021-09-29
  Administered 2021-09-29: 09:00:00 13.33 mg via INTRA_ARTICULAR

## 2021-09-29 NOTE — Progress Notes (Signed)
Office Visit Note   Patient: Nancy Young           Date of Birth: April 15, 1986           MRN: [id number] Visit Date: 09/29/2021              Requested by: Towanda Octave, MD [address] [place],  [place] [address] PCP: Towanda Octave, MD   Assessment & Plan: Visit Diagnoses:  1. Chronic pain of both knees     Plan: Impression is bilateral knee pain.  I believe the left knee is most symptomatic and she is favoring this causing the right knee to become painful.  We have discussed proceeding with cortisone injections to both knees today.  Have also encouraged her to work on quadriceps strengthening exercises for which she already has a handout.  Weight loss also encouraged.  She will follow-up with [place] as needed.  Follow-Up Instructions: Return if symptoms worsen or fail to improve.   Orders:  Orders Placed This Encounter  Procedures   Large Joint Inj: bilateral knee   XR KNEE 3 VIEW LEFT   XR KNEE 3 VIEW RIGHT   No orders of the defined types were placed in this encounter.     Procedures: Large Joint Inj: bilateral knee on 09/29/2021 8:41 AM Indications: pain Details: 22 G needle, anterolateral approach Medications (Right): 0.66 mL bupivacaine 0.25 %; 3 mL lidocaine 1 %; 13.33 mg methylPREDNISolone acetate 40 MG/ML Medications (Left): 0.66 mL bupivacaine 0.25 %; 3 mL lidocaine 1 %; 13.33 mg methylPREDNISolone acetate 40 MG/ML     Clinical Data: No additional findings.   Subjective: Chief Complaint  Patient presents with   Right Knee - Pain   Left Knee - Pain    HPI patient is a pleasant 36 year old female who comes in today with bilateral knee pain left greater than right.  She is status post left knee arthroscopic debridement lateral meniscus from March of this past year.  It was noted on the MRI that she did have a lateral intrameniscal and parameniscal cyst.  She notes that she has had some locking and catching to the knee since surgery.  The pain she  has to the left knee is to the medial and lateral aspect and only to the medial aspect of the right knee.  Symptoms are worse when sitting or standing and notes that this occurs 2-3 times a week.  She has been taking Tylenol and Advil without relief.  She has also been using ice, heat and elevation without relief.  Review of Systems as detailed in HPI.  All others reviewed and are negative.   Objective: Vital Signs: Ht 5\' 3"  (1.6 m)    Wt 219 lb (99.3 kg)    BMI 38.79 kg/m   Physical Exam well-developed well-nourished female no acute distress.  Alert and oriented x3.  Ortho Exam bilateral knee exam shows no effusion.  Range of motion 0 to 115 degrees.  Medial lateral joint line tenderness to the left knee and medial joint line tenderness to the right knee.  Ligaments are stable.  She is neurovascular intact distally.  Specialty Comments:  No specialty comments available.  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   Prediabetes 10/07/2020   GERD (gastroesophageal reflux disease) 08/27/2020   Abdominal pain in female 05/17/2020   Left knee pain 12/02/2019   Bilateral knee pain 09/23/2019   Strain of left foot 07/17/2019   Urinary frequency 06/09/2019   Sudden death of family member March 12, 2019   Carpal tunnel syndrome 12/31/2018   Vaginal discharge 05/14/2018   Health care maintenance 07/02/2017   Obesity 05/22/2017   STD exposure 05/22/2017   Past Medical History:  Diagnosis Date   BV (bacterial vaginosis)    Obesity    Vaginal discharge 07/27/2017   Yeast infection     Family History  Problem Relation Age of Onset   Diabetes Mother     Hypertension Mother    Hyperlipidemia Mother    Bipolar disorder Mother    Diabetes Maternal Grandmother    Sickle cell anemia Maternal Grandmother    Heart attack Maternal Grandmother    Anxiety disorder Maternal Grandmother    Hypertension Maternal Grandmother    Hyperlipidemia Maternal Grandmother    Stroke Maternal Grandmother    Hepatitis Maternal Grandfather    Lung disease Maternal Grandfather    Diabetes Paternal Grandmother    Hypertension Paternal Grandmother    Hyperlipidemia Paternal Grandmother    Kidney disease Maternal Aunt    Diabetes Maternal Aunt    Hypertension Maternal Aunt    Stomach cancer Other     Past Surgical History:  Procedure Laterality Date   KNEE ARTHROSCOPY Left 12/30/2020   LEEP  2013   Social History   Occupational History   Occupation: CNA  Tobacco Use   Smoking status: Never   Smokeless tobacco: Never  Vaping Use   Vaping Use: Never used  Substance and Sexual Activity   Alcohol use: Yes    Comment: 1-2 drinks per 2-3 times/week   Drug use: No   Sexual activity: Yes    Partners: Female    Birth control/protection: None

## 2021-09-30 ENCOUNTER — Ambulatory Visit: Payer: Medicaid Other | Admitting: Orthopaedic Surgery

## 2021-10-16 IMAGING — DX DG CHEST 1V PORT
1 series · 1 of 1 positions shown · non-contrast
Comparison: 02/24/2017

CLINICAL DATA: Cough, shortness of breath and chills.

EXAM:
PORTABLE CHEST 1 VIEW

[chest ap]
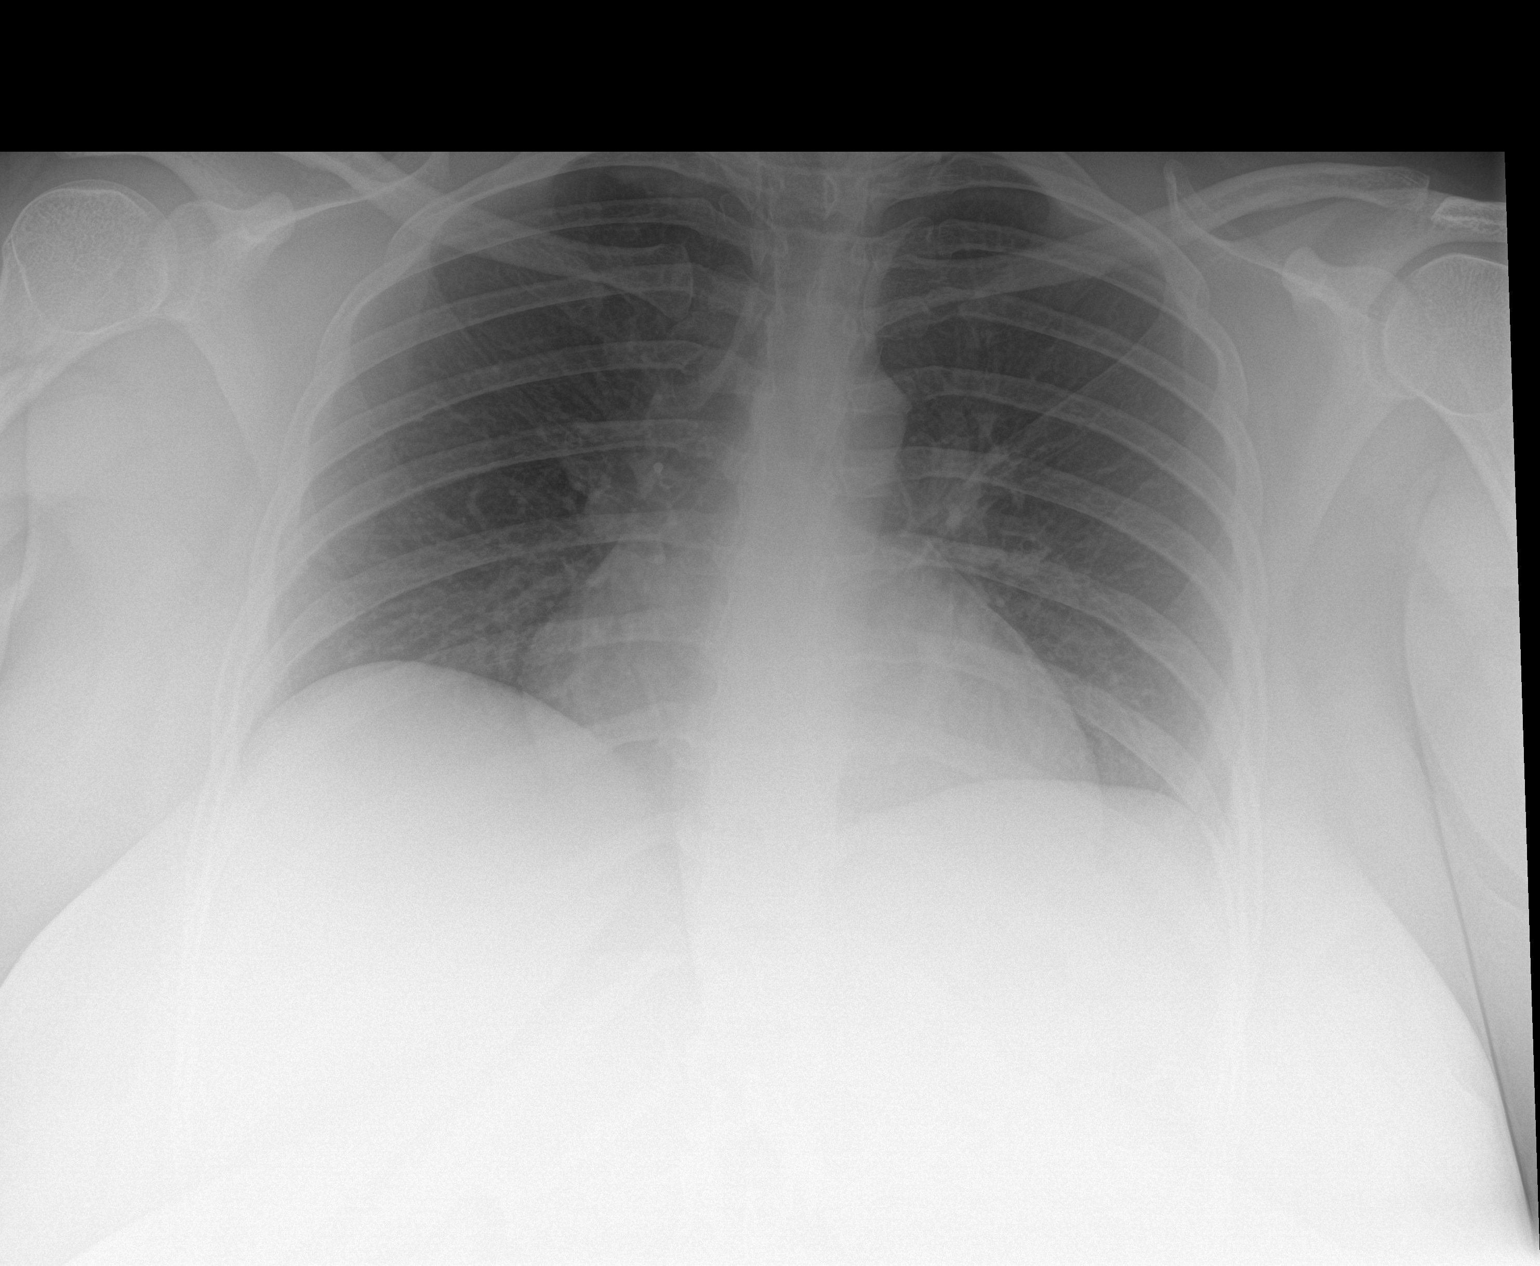

[1 of 1 positions shown; findings below may reference images not displayed]

FINDINGS: The cardiomediastinal silhouette is unremarkable.

There is no evidence of focal airspace disease, pulmonary edema,
suspicious pulmonary nodule/mass, pleural effusion, or pneumothorax.

No acute bony abnormalities are identified.
IMPRESSION: No active disease.

## 2021-10-18 DIAGNOSIS — U071 COVID-19: Secondary | ICD-10-CM | POA: Diagnosis not present

## 2021-10-20 DIAGNOSIS — M2550 Pain in unspecified joint: Secondary | ICD-10-CM | POA: Diagnosis not present

## 2021-10-20 DIAGNOSIS — K219 Gastro-esophageal reflux disease without esophagitis: Secondary | ICD-10-CM | POA: Diagnosis not present

## 2021-10-20 DIAGNOSIS — R635 Abnormal weight gain: Secondary | ICD-10-CM | POA: Diagnosis not present

## 2021-11-15 DIAGNOSIS — J111 Influenza due to unidentified influenza virus with other respiratory manifestations: Secondary | ICD-10-CM | POA: Diagnosis not present

## 2021-11-21 NOTE — Progress Notes (Signed)
° ° °  SUBJECTIVE:   CHIEF COMPLAINT / HPI:   Vaginal Discharge: Patient is a 36 y.o. female presenting with clear vaginal discharge for 7-14 days.  She denies vaginal odor.  No urinary symptoms including dysuria or urinary frequency. No abdominal pain. She is sexually active with one female partner and is not concerned about any STI. She states that she is prone to BV, and would prefer to not be swabbed but if she must, she prefer to self-swab if possible. No pruritis. She is declines screening for sexually transmitted infections today  PERTINENT  PMH / PSH: None relevant  OBJECTIVE:   BP 130/80    Pulse 97    Ht 5\' 2"  (1.575 m)    Wt 223 lb 3.2 oz (101.2 kg)    LMP 11/13/2021    SpO2 100%    BMI 40.82 kg/m    General: NAD, pleasant, able to participate in exam Respiratory: Normal effort, no obvious respiratory distress Pelvic: Patient declined pelvic examination   ASSESSMENT/PLAN:   Vaginal discharge 36 y.o. female with clear non-odorous vaginal discharge for 1-2 weeks. She has a history of BV and states that this feels similar. She declined pelvic examination today- feel this is reasonable given no other more concerning symptoms, low risk for STI as she is monogamous and no concern for pregnancy given female partner.  Patient self-swabbed.  Plan: -Wet prep negative -Discussed cleaning with water and avoiding soaps as this can increase frequency of BV      31, DO [facility]

## 2021-11-21 NOTE — Patient Instructions (Signed)
It was wonderful to see you today.  Please bring ALL of your medications with you to every visit.   Today we talked about:  - We are checking for bacterial vaginosis and yeast, which are not sexually transmitted infections. I will let you know of the results via MyChart or telephone call.  -We discussed cleaning your vaginal and pelvic area solely with water to avoid recurrent BV.   Thank you for choosing [facility].   Please call [phone] with any questions about today's appointment.  Please be sure to schedule follow up at the front  desk before you leave today.   Sabino Dick, DO PGY-2 Family Medicine

## 2021-11-22 ENCOUNTER — Ambulatory Visit (INDEPENDENT_AMBULATORY_CARE_PROVIDER_SITE_OTHER): Payer: Medicaid Other | Admitting: Family Medicine

## 2021-11-22 ENCOUNTER — Other Ambulatory Visit: Payer: Self-pay

## 2021-11-22 VITALS — BP 130/80 | HR 97 | Ht 62.0 in | Wt 223.2 lb

## 2021-11-22 DIAGNOSIS — N898 Other specified noninflammatory disorders of vagina: Secondary | ICD-10-CM

## 2021-11-22 LAB — POCT WET PREP (WET MOUNT)
Clue Cells Wet Prep Whiff POC: NEGATIVE
Trichomonas Wet Prep HPF POC: ABSENT

## 2021-11-22 NOTE — Assessment & Plan Note (Addendum)
36 y.o. female with clear non-odorous vaginal discharge for 1-2 weeks. She has a history of BV and states that this feels similar. She declined pelvic examination today- feel this is reasonable given no other more concerning symptoms, low risk for STI as she is monogamous and no concern for pregnancy given female partner.  Patient self-swabbed.  Plan: -Wet prep negative -Discussed cleaning with water and avoiding soaps as this can increase frequency of BV

## 2021-11-29 DIAGNOSIS — J111 Influenza due to unidentified influenza virus with other respiratory manifestations: Secondary | ICD-10-CM | POA: Diagnosis not present

## 2021-12-05 ENCOUNTER — Telehealth: Payer: Self-pay | Admitting: Family Medicine

## 2021-12-05 NOTE — Telephone Encounter (Signed)
..  Patient declines further follow up and engagement by the Managed Medicaid Team. Appropriate care team members and provider have been notified via electronic communication. The Managed Medicaid Team is available to follow up with the patient after provider conversation with the patient regarding recommendation for engagement and subsequent re-referral to the Managed Medicaid Team.    Jennifer Alley Care Guide, High Risk Medicaid Managed Care Embedded Care Coordination [place]  [facility]   

## 2021-12-12 ENCOUNTER — Encounter (HOSPITAL_COMMUNITY): Payer: Self-pay

## 2021-12-12 ENCOUNTER — Ambulatory Visit (INDEPENDENT_AMBULATORY_CARE_PROVIDER_SITE_OTHER): Payer: Medicaid Other

## 2021-12-12 ENCOUNTER — Ambulatory Visit (HOSPITAL_COMMUNITY)
Admission: EM | Admit: 2021-12-12 | Discharge: 2021-12-12 | Disposition: A | Discharge status: Home / Self Care | Payer: Medicaid Other | Attending: Physician Assistant | Admitting: Physician Assistant

## 2021-12-12 ENCOUNTER — Telehealth: Payer: Self-pay | Admitting: *Deleted

## 2021-12-12 ENCOUNTER — Other Ambulatory Visit: Payer: Self-pay

## 2021-12-12 ENCOUNTER — Telehealth: Payer: Medicaid Other | Admitting: Nurse Practitioner

## 2021-12-12 DIAGNOSIS — R0789 Other chest pain: Secondary | ICD-10-CM | POA: Diagnosis not present

## 2021-12-12 DIAGNOSIS — R519 Headache, unspecified: Secondary | ICD-10-CM

## 2021-12-12 DIAGNOSIS — R42 Dizziness and giddiness: Secondary | ICD-10-CM

## 2021-12-12 DIAGNOSIS — R079 Chest pain, unspecified: Secondary | ICD-10-CM

## 2021-12-12 MED ORDER — IBUPROFEN 800 MG PO TABS: 800.0000 mg | ORAL_TABLET | Freq: Three times a day (TID) | ORAL | 0 refills | Status: DC

## 2021-12-12 NOTE — Progress Notes (Signed)
?Virtual Visit Consent  ? ?Nancy Young, you are scheduled for a virtual visit with a [facility] provider today.   ?  ?Just as with appointments in the office, your consent must be obtained to participate.  Your consent will be active for this visit and any virtual visit you may have with one of our providers in the next 365 days.   ?  ?If you have a MyChart account, a copy of this consent can be sent to you electronically.  All virtual visits are billed to your insurance company just like a traditional visit in the office.   ? ?As this is a virtual visit, video technology does not allow for your provider to perform a traditional examination.  This may limit your provider's ability to fully assess your condition.  If your provider identifies any concerns that need to be evaluated in person or the need to arrange testing (such as labs, EKG, etc.), we will make arrangements to do so.   ?  ?Although advances in technology are sophisticated, we cannot ensure that it will always work on either your end or our end.  If the connection with a video visit is poor, the visit may have to be switched to a telephone visit.  With either a video or telephone visit, we are not always able to ensure that we have a secure connection.    ? ?I need to obtain your verbal consent now.   Are you willing to proceed with your visit today?  ?  ?Nancy Young has provided verbal consent on 12/12/2021 for a virtual visit (video or telephone). ?  ?Nancy Simas, Nancy Young  ? ?Date: 12/12/2021 7:30 PM ? ? ?Virtual Visit via Video Note  ? ?Nancy Young, connected with  Nancy Young  ([id number], [birth year]) on 12/12/21 at  7:30 PM EST by a video-enabled telemedicine application and verified that I am speaking with the correct person using two identifiers. ? ?Location: ?Patient: Virtual Visit Location Patient: Home ?Provider: Virtual Visit Location Provider: Home Office ?  ?I discussed the limitations of evaluation and  management by telemedicine and the availability of in person appointments. The patient expressed understanding and agreed to proceed.   ? ?History of Present Illness: ?Nancy Young is a 36 y.o. who identifies as a female who was assigned female at birth, and is being seen today with complaints of a headache, dizziness and loss of appetite. She is moving into a hotel currently because there was mold discovered in her apartment. She has been living at her current residence for 6 months. ? ?She denies a history of asthma ?She has not had a fever  ?She has not taken anything over the counter  ?Denies a cough but does have chest discomfort different from her heart burn ?She has had migraines in the past was on Imitrex in the past not currently  ? ?Would like to know how she can be seen at [facility] where she works.  ? ? ?Problems:  ?Patient Active Problem List  ? Diagnosis Date Noted  ? Amenorrhea 03/25/2021  ? Acute lateral meniscus tear of left knee 10/29/2020  ? 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 Prediabetes 10/07/2020  ? GERD (gastroesophageal reflux disease) 08/27/2020  ? Abdominal pain in female 05/17/2020  ? Left knee pain 12/02/2019  ? Bilateral knee pain 09/23/2019  ? Strain of left foot 07/17/2019  ? Urinary frequency 06-10-2019  ? Sudden death of family member 13-Mar-2019  ? Carpal tunnel syndrome 12/31/2018  ?  Vaginal discharge 05/14/2018  ? Health care maintenance 07/02/2017  ? Obesity 05/22/2017  ? STD exposure 05/22/2017  ?  ?Allergies: No Known Allergies ?Medications:  ?Current Outpatient Medications:  ?  famotidine (PEPCID) 20 MG tablet, TAKE 1 TABLET(20 MG) BY MOUTH TWICE DAILY, Disp: 60 tablet, Rfl: 2 ?  omeprazole (PRILOSEC) 20 MG capsule, Take 1 capsule (20 mg total) by mouth daily., Disp: 30 capsule, Rfl: 3 ? ?Observations/Objective: ?Patient is well-developed, well-nourished in no acute distress.  ?Resting comfortably at home.  ?Head is normocephalic, atraumatic.  ?No labored  breathing.  ?Speech is clear and coherent with logical content.  ?Patient is alert and oriented at baseline.  ? ? ?Assessment and Plan: ?1. Acute intractable headache, unspecified headache type ?2. Dizziness ?Advised patient that she should be seen in person for evaluation of ongoing headache associated with dizziness ?She was confused about needing to see a [facility] provider due to her insurance, assured her she can be seen at [facility] ER or any ER for evaluation.  ?She is also concerned about missing work, provided note that she can turn in prior to going for evaluation so that she feels she has the time to be seen.  ? ?Patient is agreeable with plan and will seek evaluation at closes ER ?   ? ?Follow Up Instructions: ?I discussed the assessment and treatment plan with the patient. The patient was provided an opportunity to ask questions and all were answered. The patient agreed with the plan and demonstrated an understanding of the instructions.  A copy of instructions were sent to the patient via MyChart unless otherwise noted below.  ? ? ?The patient was advised to call back or seek an in-person evaluation if the symptoms worsen or if the condition fails to improve as anticipated. ? ?Time:  ?I spent 15 minutes with the patient via telehealth technology discussing the above problems/concerns.   ? ?Nancy Simas, Nancy Young  ?

## 2021-12-12 NOTE — Telephone Encounter (Signed)
Patient called to inform provider that she has been exposed to black mold in her apartment.  She states that she has done home testing as well as the apartment management and patient has temporarily moved out.  Patient is wanting to get labs and a chest xray to see if this is causing any issues with her body.  Patient did state that she has been experiencing chest discomfort over the past few months and wants to make sure it is not related to the mold.  She would also like to get this done at [facility] since that is where she works.  Will forward to MD to order these external labs and imaging.  Patient is unable to come in for an appt but would be able to do a virtual if need be.  Wilmore Holsomback,CMA ? ? ?*please route to green team with next steps** ?

## 2021-12-12 NOTE — ED Provider Notes (Signed)
[facility]    CSN: [id number] Arrival date & time: 12/12/21  1951      History   Chief Complaint Chief Complaint  Patient presents with   Chest Pain    HPI Nancy Young is a 36 y.o. female.   Patient presents today with a 2-week history of intermittent chest pain that has become more persistent and worse over the past several days.  She reports pain is rated 7 on a 0-10 pain scale, described as pressure, localized to substernal region without radiation, no aggravating relieving factors identified.  She has not tried any medication for symptom relief.  She denies any associated shortness of breath, nausea, vomiting, dizziness, weakness.  She denies any history of cardiovascular disease, diabetes, hypertension, hyperlipidemia, smoking.  She denies history of asthma or COPD.  She does have a history of GERD but has been taking medication as prescribed and reports this is not similar to previous episodes of this condition.  She is concerned that it may be related to mold exposure as she recently found out that there is mold in her home.  She denies any recent illness including cough or congestion.   Past Medical History:  Diagnosis Date   BV (bacterial vaginosis)    Obesity    Vaginal discharge 07/27/2017   Yeast infection     Patient Active Problem List   Diagnosis Date Noted   Amenorrhea 03/25/2021   Acute lateral meniscus tear of left knee 10/29/2020   Perimeniscal cyst of left knee 10/29/2020   Prediabetes 10/07/2020   GERD (gastroesophageal reflux disease) 08/27/2020   Abdominal pain in female 05/17/2020   Left knee pain 12/02/2019   Bilateral knee pain 09/23/2019   Strain of left foot 07/17/2019   Urinary frequency 16-Jun-2019   Sudden death of family member 03-19-19   Carpal tunnel syndrome 12/31/2018   Vaginal discharge 05/14/2018   Health care maintenance 07/02/2017   Obesity 05/22/2017   STD exposure 05/22/2017    Past Surgical History:   Procedure Laterality Date   KNEE ARTHROSCOPY Left 12/30/2020   LEEP  2013    OB History   No obstetric history on file.      Home Medications    Prior to Admission medications   Medication Sig Start Date End Date Taking? Authorizing Provider  ibuprofen (ADVIL) 800 MG tablet Take 1 tablet (800 mg total) by mouth 3 (three) times daily. 12/12/21  Yes Mathilda Maguire K, PA-C  famotidine (PEPCID) 20 MG tablet TAKE 1 TABLET(20 MG) BY MOUTH TWICE DAILY 09/28/21   Brimage, Vondra, DO  omeprazole (PRILOSEC) 20 MG capsule Take 1 capsule (20 mg total) by mouth daily. 08/31/21   Jackelyn Poling, DO    Family History Family History  Problem Relation Age of Onset   Diabetes Mother    Hypertension Mother    Hyperlipidemia Mother    Bipolar disorder Mother    Diabetes Maternal Grandmother    Sickle cell anemia Maternal Grandmother    Heart attack Maternal Grandmother    Anxiety disorder Maternal Grandmother    Hypertension Maternal Grandmother    Hyperlipidemia Maternal Grandmother    Stroke Maternal Grandmother    Hepatitis Maternal Grandfather    Lung disease Maternal Grandfather    Diabetes Paternal Grandmother    Hypertension Paternal Grandmother    Hyperlipidemia Paternal Grandmother    Kidney disease Maternal Aunt    Diabetes Maternal Aunt    Hypertension Maternal Aunt    Stomach cancer Other  Social History Social History   Tobacco Use   Smoking status: Never   Smokeless tobacco: Never  Vaping Use   Vaping Use: Never used  Substance Use Topics   Alcohol use: Yes    Comment: 1-2 drinks per 2-3 times/week   Drug use: No     Allergies   Patient has no known allergies.   Review of Systems Review of Systems  Constitutional:  Negative for activity change, appetite change, fatigue and fever.  Respiratory:  Negative for cough and shortness of breath.   Cardiovascular:  Positive for chest pain. Negative for palpitations.  Gastrointestinal:  Negative for abdominal  pain, diarrhea, nausea and vomiting.  Neurological:  Negative for dizziness, light-headedness and headaches.    Physical Exam Triage Vital Signs ED Triage Vitals  Enc Vitals Group     BP 12/12/21 1955 129/84     Pulse Rate 12/12/21 1955 75     Resp 12/12/21 1954 16     Temp 12/12/21 1955 98 F (36.7 C)     Temp Source 12/12/21 1955 Oral     SpO2 12/12/21 1954 98 %     Weight --      Height --      Head Circumference --      Peak Flow --      Pain Score --      Pain Loc --      Pain Edu? --      Excl. in [place]? --    No data found.  Updated Vital Signs BP 129/84 (BP Location: Left Arm)    Pulse 75    Temp 98 F (36.7 C) (Oral)    Resp 16    LMP 11/13/2021    SpO2 100%   Visual Acuity Right Eye Distance:   Left Eye Distance:   Bilateral Distance:    Right Eye Near:   Left Eye Near:    Bilateral Near:     Physical Exam Vitals reviewed.  Constitutional:      General: She is awake. She is not in acute distress.    Appearance: Normal appearance. She is well-developed. She is not ill-appearing.     Comments: Very pleasant female appears stated age in no acute distress sitting comfortably in exam room  HENT:     Head: Normocephalic and atraumatic.  Cardiovascular:     Rate and Rhythm: Normal rate and regular rhythm.     Heart sounds: Normal heart sounds, S1 normal and S2 normal. No murmur heard. Pulmonary:     Effort: Pulmonary effort is normal.     Breath sounds: Normal breath sounds. No wheezing, rhonchi or rales.     Comments: Clear to auscultation bilaterally Chest:     Chest wall: Tenderness present. No deformity or swelling.     Comments: Pain is reproducible on exam Musculoskeletal:     Right lower leg: No edema.     Left lower leg: No edema.  Psychiatric:        Behavior: Behavior is cooperative.     UC Treatments / Results  Labs (all labs ordered are listed, but only abnormal results are displayed) Labs Reviewed - No data to  display  EKG   Radiology DG Chest 2 View  Result Date: 12/12/2021 CLINICAL DATA:  Chest pain. EXAM: CHEST - 2 VIEW COMPARISON:  Feb 26, 2021. FINDINGS: The heart size and mediastinal contours are within normal limits. Both lungs are clear. The visualized skeletal structures are unremarkable. IMPRESSION: No active  cardiopulmonary disease. Electronically Signed   By: Marijo Conception M.D.   On: 12/12/2021 21:14    Procedures Procedures (including critical care time)  Medications Ordered in UC Medications - No data to display  Initial Impression / Assessment and Plan / UC Course  I have reviewed the triage vital signs and the nursing notes.  Pertinent labs & imaging results that were available during my care of the patient were reviewed by me and considered in my medical decision making (see chart for details).     Patient is well-appearing, nontoxic, afebrile, nontachycardic.  EKG obtained showed normal sinus rhythm with ventricular rate of 65 bpm without ischemic changes; no significant change compared to 02/28/2021 tracing.  Discussed symptoms are likely musculoskeletal in etiology given reproducible on exam, however, patient is very concerned that this is related to mold exposure and requested a chest x-ray.  Discussed that this does not seem necessary but given concern chest x-ray was obtained per patient request which showed no acute cardiopulmonary disease.  Discussed that symptoms are likely musculoskeletal in nature given clinical presentation she started on ibuprofen 800 mg 3 times daily with instruction not to take additional NSAIDs with this medication.  Discussed that this can worsen her acid reflux and she should continue her acid reflux medication as previously prescribed make sure to take this medication with food.  Recommended close follow-up with her PCP.  Discussed that if she has any worsening symptoms including recurrent severe chest pain, shortness of breath, nausea/vomiting,  weakness, dizziness she needs to go to the emergency room immediately.  Strict return precautions given to which she expressed understanding.  Final Clinical Impressions(s) / UC Diagnoses   Final diagnoses:  Chest wall pain     Discharge Instructions      Your EKG was normal.  Your chest x-ray was normal; I am waiting for the radiologist to read this we will contact you if it is abnormal tomorrow.  I believe that you have costochondritis or chest wall pain given your presentation.  Please start ibuprofen 800 mg 3 times daily.  Do not take additional NSAIDs including aspirin, ibuprofen/Advil, naproxen/Aleve with this medication as it can cause stomach bleeding.  You can use Tylenol for breakthrough pain.  Please follow-up with your primary care provider soon as possible for reevaluation.  If you develop any worsening symptoms including worsening chest pain, shortness of breath, nausea/vomiting, weakness, dizziness you need to go to the emergency room immediately.     ED Prescriptions     Medication Sig Dispense Auth. Provider   ibuprofen (ADVIL) 800 MG tablet Take 1 tablet (800 mg total) by mouth 3 (three) times daily. 21 tablet Tahjanae Blankenburg, Derry Skill, PA-C      PDMP not reviewed this encounter.   Terrilee Croak, PA-C 12/12/21 2120

## 2021-12-12 NOTE — ED Triage Notes (Signed)
Pt presents for chest pain x 2 weeks.  ?

## 2021-12-12 NOTE — Discharge Instructions (Signed)
Your EKG was normal.  Your chest x-ray was normal; I am waiting for the radiologist to read this we will contact you if it is abnormal tomorrow.  I believe that you have costochondritis or chest wall pain given your presentation.  Please start ibuprofen 800 mg 3 times daily.  Do not take additional NSAIDs including aspirin, ibuprofen/Advil, naproxen/Aleve with this medication as it can cause stomach bleeding.  You can use Tylenol for breakthrough pain.  Please follow-up with your primary care provider soon as possible for reevaluation.  If you develop any worsening symptoms including worsening chest pain, shortness of breath, nausea/vomiting, weakness, dizziness you need to go to the emergency room immediately. ?

## 2021-12-13 NOTE — Telephone Encounter (Signed)
Looks like pt was seen yesterday in the ER and already had a Cxr which was normal. Pt should follow up in clinic if she has further concerns. FYI I cannot route to green team on epic. Thank you. ?

## 2021-12-31 DIAGNOSIS — J111 Influenza due to unidentified influenza virus with other respiratory manifestations: Secondary | ICD-10-CM | POA: Diagnosis not present

## 2022-01-06 ENCOUNTER — Ambulatory Visit (INDEPENDENT_AMBULATORY_CARE_PROVIDER_SITE_OTHER): Payer: Medicaid Other | Admitting: Orthopaedic Surgery

## 2022-01-06 DIAGNOSIS — Z6841 Body Mass Index (BMI) 40.0 and over, adult: Secondary | ICD-10-CM | POA: Diagnosis not present

## 2022-01-06 DIAGNOSIS — M25562 Pain in left knee: Secondary | ICD-10-CM

## 2022-01-06 DIAGNOSIS — G8929 Other chronic pain: Secondary | ICD-10-CM

## 2022-01-06 NOTE — Progress Notes (Signed)
? ?Office Visit Note ?  ?Patient: Nancy Young           ?Date of Birth: 07-19-1986           ?MRN: [id number] ?Visit Date: 01/06/2022 ?             ?Requested by: Towanda Octave, MD ?[address] ?[place],  [place] [address] ?PCP: Towanda Octave, MD ? ? ?Assessment & Plan: ?Visit Diagnoses:  ?1. Chronic pain of left knee   ?2. Body mass index 40.0-44.9, adult (HCC)   ?3. Morbid obesity (HCC)   ? ? ?Plan: Impression is chronic left knee pain.  Recommend relative rest.  Strongly encouraged patient on aggressive weight loss.  She has had arthroscopy and MRI which other than the meniscus tear that was treated surgically she has no other structural abnormalities.  Follow-up as needed.  Do not recommend continuing with cortisone injections.  She will pick up some Voltaren gel.  Over-the-counter NSAIDs. ? ?The patient meets the AMA guidelines for Morbid (severe) obesity with a BMI > 40.0 and I have recommended weight loss. ? ?Follow-Up Instructions: No follow-ups on file.  ? ?Orders:  ?Orders Placed This Encounter  ?Procedures  ? Ambulatory referral to Physical Therapy  ? ?No orders of the defined types were placed in this encounter. ? ? ? ? Procedures: ?No procedures performed ? ? ?Clinical Data: ?No additional findings. ? ? ?Subjective: ?Chief Complaint  ?Patient presents with  ? Left Knee - Follow-up, Pain  ? ? ?HPI ? ?Nancy Young returns today for couple months of left knee pain.  She has been working out icing the knee for the pain.  Denies any injuries.  She has had pain for few weeks.  The pain is around peripatellar.  Denies any mechanical symptoms.  Feels subjective swelling. ? ?Review of Systems  ?Constitutional: Negative.   ?HENT: Negative.    ?Eyes: Negative.   ?Respiratory: Negative.    ?Cardiovascular: Negative.   ?Endocrine: Negative.   ?Musculoskeletal: Negative.   ?Neurological: Negative.   ?Hematological: Negative.   ?Psychiatric/Behavioral: Negative.    ?All other systems reviewed and are  negative. ? ? ?Objective: ?Vital Signs: There were no vitals taken for this visit. ? ?Physical Exam ?Vitals and nursing note reviewed.  ?Constitutional:   ?   Appearance: She is well-developed.  ?Pulmonary:  ?   Effort: Pulmonary effort is normal.  ?Skin: ?   General: Skin is warm.  ?   Capillary Refill: Capillary refill takes less than 2 seconds.  ?Neurological:  ?   Mental Status: She is alert and oriented to person, place, and time.  ?Psychiatric:     ?   Behavior: Behavior normal.     ?   Thought Content: Thought content normal.     ?   Judgment: Judgment normal.  ? ? ?Ortho Exam ? ?Examination of the left knee shows no joint effusion.  Minimal crepitus with range of motion.  Patella tracking is normal.  Collaterals and cruciates are stable. ? ?Specialty Comments:  ?No specialty comments available. ? ?Imaging: ?No results found. ? ? ?PMFS History: ?Patient Active Problem List  ? Diagnosis Date Noted  ? Amenorrhea 03/25/2021  ? Acute lateral meniscus tear of left knee 10/29/2020  ? 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 Prediabetes 10/07/2020  ? GERD (gastroesophageal reflux disease) 08/27/2020  ? Abdominal pain in female 05/17/2020  ? Left knee pain 12/02/2019  ? Bilateral knee pain 09/23/2019  ? Strain of left foot  07/17/2019  ? Urinary frequency 07-Jun-2019  ? Sudden death of family member March 10, 2019  ? Carpal tunnel syndrome 12/31/2018  ? Vaginal discharge 05/14/2018  ? Health care maintenance 07/02/2017  ? Obesity 05/22/2017  ? STD exposure 05/22/2017  ? ?Past Medical History:  ?Diagnosis Date  ? BV (bacterial vaginosis)   ? Obesity   ? Vaginal discharge 07/27/2017  ? Yeast infection   ?  ?Family History  ?Problem Relation Age of Onset  ? Diabetes Mother   ? Hypertension Mother   ? Hyperlipidemia Mother   ? Bipolar disorder Mother   ? Diabetes Maternal Grandmother   ? Sickle cell anemia Maternal Grandmother   ? Heart attack Maternal Grandmother   ? Anxiety disorder Maternal Grandmother   ? Hypertension  Maternal Grandmother   ? Hyperlipidemia Maternal Grandmother   ? Stroke Maternal Grandmother   ? Hepatitis Maternal Grandfather   ? Lung disease Maternal Grandfather   ? Diabetes Paternal Grandmother   ? Hypertension Paternal Grandmother   ? Hyperlipidemia Paternal Grandmother   ? Kidney disease Maternal Aunt   ? Diabetes Maternal Aunt   ? Hypertension Maternal Aunt   ? Stomach cancer Other   ?  ?Past Surgical History:  ?Procedure Laterality Date  ? KNEE ARTHROSCOPY Left 12/30/2020  ? LEEP  2013  ? ?Social History  ? ?Occupational History  ? Occupation: CNA  ?Tobacco Use  ? Smoking status: Never  ? Smokeless tobacco: Never  ?Vaping Use  ? Vaping Use: Never used  ?Substance and Sexual Activity  ? Alcohol use: Yes  ?  Comment: 1-2 drinks per 2-3 times/week  ? Drug use: No  ? Sexual activity: Yes  ?  Partners: Female  ?  Birth control/protection: None  ? ? ? ? ? ? ?

## 2022-01-17 ENCOUNTER — Telehealth: Payer: Medicaid Other | Admitting: Emergency Medicine

## 2022-01-17 DIAGNOSIS — N898 Other specified noninflammatory disorders of vagina: Secondary | ICD-10-CM | POA: Diagnosis not present

## 2022-01-17 MED ORDER — METRONIDAZOLE 0.75 % VA GEL: 1.0000 | Freq: Every day | VAGINAL | 0 refills | Status: AC

## 2022-01-17 MED ORDER — FLUCONAZOLE 150 MG PO TABS: 150.0000 mg | ORAL_TABLET | Freq: Once | ORAL | 0 refills | Status: AC

## 2022-01-17 NOTE — Patient Instructions (Signed)
Take medications as prescribed. ? ?Take the single dose of DIFLUCAN following the last day of the METROGEL. ?

## 2022-01-17 NOTE — Progress Notes (Signed)
?Virtual Visit Consent  ? ?Nancy Young, you are scheduled for a virtual visit with a [facility] provider today.   ?  ?Just as with appointments in the office, your consent must be obtained to participate.  Your consent will be active for this visit and any virtual visit you may have with one of our providers in the next 365 days.   ?  ?If you have a MyChart account, a copy of this consent can be sent to you electronically.  All virtual visits are billed to your insurance company just like a traditional visit in the office.   ? ?As this is a virtual visit, video technology does not allow for your provider to perform a traditional examination.  This may limit your provider's ability to fully assess your condition.  If your provider identifies any concerns that need to be evaluated in person or the need to arrange testing (such as labs, EKG, etc.), we will make arrangements to do so.   ?  ?Although advances in technology are sophisticated, we cannot ensure that it will always work on either your end or our end.  If the connection with a video visit is poor, the visit may have to be switched to a telephone visit.  With either a video or telephone visit, we are not always able to ensure that we have a secure connection.    ? ?I need to obtain your verbal consent now.   Are you willing to proceed with your visit today?  ?  ?Annsley Akkerman has provided verbal consent on 01/17/2022 for a virtual visit (video or telephone). ?  ?Roxy Horseman, PA-C  ? ?Date: 01/17/2022 1:01 PM ? ? ?Virtual Visit via Video Note  ? Merwyn Katos, connected with  Roslind Michaux  ([id number], 10/25/1985) on 01/17/22 at  1:00 PM EDT by a video-enabled telemedicine application and verified that I am speaking with the correct person using two identifiers.  Started video then converted to telephone due to audio failure. ? ?Location: ?Patient: Virtual Visit Location Patient: Mobile ?Provider: Virtual Visit Location  Provider: Home Office ?  ?I discussed the limitations of evaluation and management by telemedicine and the availability of in person appointments. The patient expressed understanding and agreed to proceed.   ? ?History of Present Illness: ?Nancy Young is a 36 y.o. who identifies as a female who was assigned female at birth, and is being seen today for vaginal discharge.  Denies odor.  States that she has been using a new soap that she thinks started it.  Denies pregnancy or breastfeeding. ? ?HPI: HPI  ?Problems:  ?Patient Active Problem List  ? Diagnosis Date Noted  ? Amenorrhea 03/25/2021  ? Acute lateral meniscus tear of left knee 10/29/2020  ? 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 Prediabetes 10/07/2020  ? GERD (gastroesophageal reflux disease) 08/27/2020  ? Abdominal pain in female 05/17/2020  ? Left knee pain 12/02/2019  ? Bilateral knee pain 09/23/2019  ? Strain of left foot 07/17/2019  ? Urinary frequency Jun 17, 2019  ? Sudden death of family member 2019-03-20  ? Carpal tunnel syndrome 12/31/2018  ? Vaginal discharge 05/14/2018  ? Health care maintenance 07/02/2017  ? Obesity 05/22/2017  ? STD exposure 05/22/2017  ?  ?Allergies: No Known Allergies ?Medications:  ?Current Outpatient Medications:  ?  famotidine (PEPCID) 20 MG tablet, TAKE 1 TABLET(20 MG) BY MOUTH TWICE DAILY, Disp: 60 tablet, Rfl: 2 ?  ibuprofen (ADVIL) 800 MG tablet, Take 1 tablet (800 mg  total) by mouth 3 (three) times daily., Disp: 21 tablet, Rfl: 0 ?  omeprazole (PRILOSEC) 20 MG capsule, Take 1 capsule (20 mg total) by mouth daily., Disp: 30 capsule, Rfl: 3 ? ?Observations/Objective: ?Patient is well-developed, well-nourished in no acute distress ?Mobile.  ?Head is normocephalic, atraumatic.  ?No labored breathing.  ?Speech is clear and coherent with logical content.  ?Patient is alert and oriented at baseline.  ? ? ?Assessment and Plan: ?1. Vaginal discharge ? ?- Patient expresses no concern for STD.  States this is  similar to prior BV.   ?- Treat with metrogel per request ?- Diflucan to cover for yeast after abx ? ?Follow Up Instructions: ?I discussed the assessment and treatment plan with the patient. The patient was provided an opportunity to ask questions and all were answered. The patient agreed with the plan and demonstrated an understanding of the instructions.  A copy of instructions were sent to the patient via MyChart unless otherwise noted below.  ? ? ? ?The patient was advised to call back or seek an in-person evaluation if the symptoms worsen or if the condition fails to improve as anticipated. ? ?Time:  ?I spent 10 minutes with the patient via telehealth technology discussing the above problems/concerns.   ? ?Roxy Horseman, PA-C ? ?

## 2022-02-03 DIAGNOSIS — E782 Mixed hyperlipidemia: Secondary | ICD-10-CM | POA: Diagnosis not present

## 2022-02-03 DIAGNOSIS — Z6841 Body Mass Index (BMI) 40.0 and over, adult: Secondary | ICD-10-CM | POA: Diagnosis not present

## 2022-02-03 DIAGNOSIS — R739 Hyperglycemia, unspecified: Secondary | ICD-10-CM | POA: Diagnosis not present

## 2022-02-03 DIAGNOSIS — K219 Gastro-esophageal reflux disease without esophagitis: Secondary | ICD-10-CM | POA: Diagnosis not present

## 2022-02-05 ENCOUNTER — Telehealth: Payer: Medicaid Other | Admitting: Nurse Practitioner

## 2022-02-05 DIAGNOSIS — N898 Other specified noninflammatory disorders of vagina: Secondary | ICD-10-CM | POA: Diagnosis not present

## 2022-02-05 NOTE — Progress Notes (Signed)
? ?Virtual Visit Consent  ? ?Nancy Young, you are scheduled for a virtual visit with Mary-Margaret Daphine Deutscher, FNP, a [facility] provider, today.   ?  ?Just as with appointments in the office, your consent must be obtained to participate.  Your consent will be active for this visit and any virtual visit you may have with one of our providers in the next 365 days.   ?  ?If you have a MyChart account, a copy of this consent can be sent to you electronically.  All virtual visits are billed to your insurance company just like a traditional visit in the office.   ? ?As this is a virtual visit, video technology does not allow for your provider to perform a traditional examination.  This may limit your provider's ability to fully assess your condition.  If your provider identifies any concerns that need to be evaluated in person or the need to arrange testing (such as labs, EKG, etc.), we will make arrangements to do so.   ?  ?Although advances in technology are sophisticated, we cannot ensure that it will always work on either your end or our end.  If the connection with a video visit is poor, the visit may have to be switched to a telephone visit.  With either a video or telephone visit, we are not always able to ensure that we have a secure connection.    ? ?I need to obtain your verbal consent now.   Are you willing to proceed with your visit today? YES ?  ?Nancy Young has provided verbal consent on 02/05/2022 for a virtual visit (video or telephone). ?  ?Mary-Margaret Daphine Deutscher, FNP  ? ?Date: 02/05/2022 11:21 AM ? ? ?Virtual Visit via Video Note  ? ?I, Mary-Margaret Daphine Deutscher, connected with Nancy Young ([id number], 25-Sep-1986) on 02/05/22 at 11:15 AM EDT by a video-enabled telemedicine application and verified that I am speaking with the correct person using two identifiers. ? ?Location: ?Patient: Virtual Visit Location Patient: Mobile ?Provider: Virtual Visit Location Provider: Mobile ?  ?I  discussed the limitations of evaluation and management by telemedicine and the availability of in person appointments. The patient expressed understanding and agreed to proceed.   ? ?History of Present Illness: ?Nancy Young is a 36 y.o. who identifies as a female who was assigned female at birth, and is being seen today for vaginal discharge. ? ?HPI: Patient does video visit to day c/o vaginal discharge. She was treated for BV with flagyl about 2 weeks ago. She actually finished the flagyl 3 days ago. She was also given a diflucan for yeats and she took that as well. She states she still has a discharge that is slightly clumpy, but denies fou odor, or itching. She is sexually active but has had the same partner for over a year. ?  ?Review of Systems  ?Genitourinary:  Positive for dysuria, frequency and urgency.  ? ?Problems:  ?Patient Active Problem List  ? Diagnosis Date Noted  ? Amenorrhea 03/25/2021  ? Acute lateral meniscus tear of left knee 10/29/2020  ? 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 Prediabetes 10/07/2020  ? GERD (gastroesophageal reflux disease) 08/27/2020  ? Abdominal pain in female 05/17/2020  ? Left knee pain 12/02/2019  ? Bilateral knee pain 09/23/2019  ? Strain of left foot 07/17/2019  ? Urinary frequency June 07, 2019  ? Sudden death of family member 03-10-2019  ? Carpal tunnel syndrome 12/31/2018  ? Vaginal discharge 05/14/2018  ? Health care maintenance 07/02/2017  ?  Obesity 05/22/2017  ? STD exposure 05/22/2017  ?  ?Allergies: No Known Allergies ?Medications:  ?Current Outpatient Medications:  ?  famotidine (PEPCID) 20 MG tablet, TAKE 1 TABLET(20 MG) BY MOUTH TWICE DAILY, Disp: 60 tablet, Rfl: 2 ?  ibuprofen (ADVIL) 800 MG tablet, Take 1 tablet (800 mg total) by mouth 3 (three) times daily., Disp: 21 tablet, Rfl: 0 ?  omeprazole (PRILOSEC) 20 MG capsule, Take 1 capsule (20 mg total) by mouth daily., Disp: 30 capsule, Rfl: 3 ? ?Observations/Objective: ?Patient is well-developed,  well-nourished in no acute distress.  ?Resting comfortably  at home.  ?Head is normocephalic, atraumatic.  ?No labored breathing.  ?Speech is clear and coherent with logical content.  ?Patient is alert and oriented at baseline.  ? ? ?Assessment and Plan: ? ?Nancy Young in today with chief complaint of No chief complaint on file. ? ? ?1. Vaginal discharge ?Needs face to face visit.- need to have discharge looked and and made sure she is getting proper treatment ?Avoid bubble baths ?No douching ? ? ? ?Follow Up Instructions: ?I discussed the assessment and treatment plan with the patient. The patient was provided an opportunity to ask questions and all were answered. The patient agreed with the plan and demonstrated an understanding of the instructions.  A copy of instructions were sent to the patient via MyChart. ? ?The patient was advised to call back or seek an in-person evaluation if the symptoms worsen or if the condition fails to improve as anticipated. ? ?Time:  ?I spent 10 minutes with the patient via telehealth technology discussing the above problems/concerns.   ? ?Mary-Margaret Daphine Deutscher, FNP ? ? ?

## 2022-02-15 DIAGNOSIS — J111 Influenza due to unidentified influenza virus with other respiratory manifestations: Secondary | ICD-10-CM | POA: Diagnosis not present

## 2022-03-14 ENCOUNTER — Encounter: Payer: Self-pay | Admitting: *Deleted

## 2022-03-28 ENCOUNTER — Telehealth: Payer: Medicaid Other | Admitting: Physician Assistant

## 2022-03-28 DIAGNOSIS — T3695XA Adverse effect of unspecified systemic antibiotic, initial encounter: Secondary | ICD-10-CM

## 2022-03-28 DIAGNOSIS — N760 Acute vaginitis: Secondary | ICD-10-CM | POA: Diagnosis not present

## 2022-03-28 DIAGNOSIS — B379 Candidiasis, unspecified: Secondary | ICD-10-CM | POA: Diagnosis not present

## 2022-03-28 DIAGNOSIS — B9689 Other specified bacterial agents as the cause of diseases classified elsewhere: Secondary | ICD-10-CM | POA: Diagnosis not present

## 2022-03-28 MED ORDER — METRONIDAZOLE 0.75 % EX GEL
CUTANEOUS | 0 refills | Status: DC
Start: 2022-03-28 — End: 2022-08-21

## 2022-03-28 MED ORDER — FLUCONAZOLE 150 MG PO TABS: 150.0000 mg | ORAL_TABLET | Freq: Once | ORAL | 0 refills | Status: DC

## 2022-03-28 MED ORDER — FLUCONAZOLE 150 MG PO TABS: 150.0000 mg | ORAL_TABLET | Freq: Once | ORAL | 0 refills | Status: AC

## 2022-03-28 NOTE — Progress Notes (Signed)
Virtual Visit Consent   Nancy Young, you are scheduled for a virtual visit with a  provider today. Just as with appointments in the office, your consent must be obtained to participate. Your consent will be active for this visit and any virtual visit you may have with one of our providers in the next 365 days. If you have a MyChart account, a copy of this consent can be sent to you electronically.  As this is a virtual visit, video technology does not allow for your provider to perform a traditional examination. This may limit your provider's ability to fully assess your condition. If your provider identifies any concerns that need to be evaluated in person or the need to arrange testing (such as labs, EKG, etc.), we will make arrangements to do so. Although advances in technology are sophisticated, we cannot ensure that it will always work on either your end or our end. If the connection with a video visit is poor, the visit may have to be switched to a telephone visit. With either a video or telephone visit, we are not always able to ensure that we have a secure connection.  By engaging in this virtual visit, you consent to the provision of healthcare and authorize for your insurance to be billed (if applicable) for the services provided during this visit. Depending on your insurance coverage, you may receive a charge related to this service.  I need to obtain your verbal consent now. Are you willing to proceed with your visit today? Marsela Brown-McLean has provided verbal consent on 03/28/2022 for a virtual visit (video or telephone). Piedad Climes, [place]  Date: 03/28/2022 8:47 AM  Virtual Visit via Video Note   I, Piedad Climes, connected with  Abrina Petz  ([id number], 03/03/1986) on 03/28/22 at  8:45 AM EDT by a video-enabled telemedicine application and verified that I am speaking with the correct person using two identifiers.  Location: Patient:  Virtual Visit Location Patient: Home Provider: Virtual Visit Location Provider: Home Office   I discussed the limitations of evaluation and management by telemedicine and the availability of in person appointments. The patient expressed understanding and agreed to proceed.    History of Present Illness: Nancy Young is a 36 y.o. who identifies as a female who was assigned female at birth, and is being seen today for possible BV. Notes starting after recent trip to the beach. Having vaginal discharge and odor. Notes using new soaps/lotions while at the beach and not sure if this or combination of this and being in the water has caused her issue. Denies urinary changes or dysuria. Denies fever, chills, malaise.  Denies new sexual partners or concern for STI. LMP 03/09/2022. Denies concerns for pregnancy. Has significant history of BV in the past and this feels identical to previous episodes.  HPI: HPI  Problems:  Patient Active Problem List   Diagnosis Date Noted   Amenorrhea 03/25/2021   Acute lateral meniscus tear of left knee 10/29/2020   Perimeniscal cyst of left knee 10/29/2020   Prediabetes 10/07/2020   GERD (gastroesophageal reflux disease) 08/27/2020   Abdominal pain in female 05/17/2020   Left knee pain 12/02/2019   Bilateral knee pain 09/23/2019   Strain of left foot 07/17/2019   Urinary frequency June 03, 2019   Sudden death of family member 2019/03/06   Carpal tunnel syndrome 12/31/2018   Vaginal discharge 05/14/2018   Health care maintenance 07/02/2017   Obesity 05/22/2017   STD exposure 05/22/2017  Allergies: No Known Allergies Medications:  Current Outpatient Medications:    metroNIDAZOLE (METROGEL) 0.75 % gel, Insert one applicatorful (5g) of medicine into the vagina once nightly x 5 days, Disp: 25 g, Rfl: 0   famotidine (PEPCID) 20 MG tablet, TAKE 1 TABLET(20 MG) BY MOUTH TWICE DAILY, Disp: 60 tablet, Rfl: 2   fluconazole (DIFLUCAN) 150 MG tablet, Take 1  tablet (150 mg total) by mouth once for 1 dose. Repeat in 3 days., Disp: 2 tablet, Rfl: 0   ibuprofen (ADVIL) 800 MG tablet, Take 1 tablet (800 mg total) by mouth 3 (three) times daily., Disp: 21 tablet, Rfl: 0   omeprazole (PRILOSEC) 20 MG capsule, Take 1 capsule (20 mg total) by mouth daily., Disp: 30 capsule, Rfl: 3   OZEMPIC, 0.25 OR 0.5 MG/DOSE, 2 MG/3ML SOPN, Inject 0.5 mg into the skin once a week., Disp: , Rfl:   Observations/Objective: Patient is well-developed, well-nourished in no acute distress.  Resting comfortably in parked car.  Head is normocephalic, atraumatic.  No labored breathing. Speech is clear and coherent with logical content.  Patient is alert and oriented at baseline.    Assessment and Plan: 1. BV (bacterial vaginosis) - metroNIDAZOLE (METROGEL) 0.75 % gel; Insert one applicatorful (5g) of medicine into the vagina once nightly x 5 days  Dispense: 25 g; Refill: 0  2. Antibiotic-induced yeast infection - fluconazole (DIFLUCAN) 150 MG tablet; Take 1 tablet (150 mg total) by mouth once for 1 dose. Repeat in 3 days.  Dispense: 2 tablet; Refill: 0  Will start treatment for suspected BV with Metrogel as she does not tolerate oral Flagyl. Will give Diflucan giving her history of antibiotic-induced yeast infection. Strict precautions warranting in-person evaluation reviewed with patient.   Follow Up Instructions: I discussed the assessment and treatment plan with the patient. The patient was provided an opportunity to ask questions and all were answered. The patient agreed with the plan and demonstrated an understanding of the instructions.  A copy of instructions were sent to the patient via MyChart unless otherwise noted below.    The patient was advised to call back or seek an in-person evaluation if the symptoms worsen or if the condition fails to improve as anticipated.  Time:  I spent 10 minutes with the patient via telehealth technology discussing the above  problems/concerns.    Piedad Climes, PA-C

## 2022-03-28 NOTE — Patient Instructions (Signed)
  Nancy Young, thank you for joining Piedad Climes, PA-C for today's virtual visit.  While this provider is not your primary care provider (PCP), if your PCP is located in our provider database this encounter information will be shared with them immediately following your visit.  Consent: (Patient) Nancy Young provided verbal consent for this virtual visit at the beginning of the encounter.  Current Medications:  Current Outpatient Medications:    famotidine (PEPCID) 20 MG tablet, TAKE 1 TABLET(20 MG) BY MOUTH TWICE DAILY, Disp: 60 tablet, Rfl: 2   ibuprofen (ADVIL) 800 MG tablet, Take 1 tablet (800 mg total) by mouth 3 (three) times daily., Disp: 21 tablet, Rfl: 0   omeprazole (PRILOSEC) 20 MG capsule, Take 1 capsule (20 mg total) by mouth daily., Disp: 30 capsule, Rfl: 3   OZEMPIC, 0.25 OR 0.5 MG/DOSE, 2 MG/3ML SOPN, Inject 0.5 mg into the skin once a week., Disp: , Rfl:    Medications ordered in this encounter:  No orders of the defined types were placed in this encounter.    *If you need refills on other medications prior to your next appointment, please contact your pharmacy*  Follow-Up: Call back or seek an in-person evaluation if the symptoms worsen or if the condition fails to improve as anticipated.  Other Instructions Please take medications as directed. If not resolving or any new/worsening symptoms despite treatment, please seek an in-person evaluation.      If you have been instructed to have an in-person evaluation today at a local Urgent Care facility, please use the link below. It will take you to a list of all of our available [facility], including address, phone number and hours of operation. Please do not delay care.  [facility]  If you or a family member do not have a primary care provider, use the link below to schedule a visit and establish care. When you choose a [facility] primary care physician or  advanced practice provider, you gain a long-term partner in health. Find a Primary Care Provider  Learn more about [facility]'s in-office and virtual care options:  - Get Care Now

## 2022-05-24 DIAGNOSIS — K219 Gastro-esophageal reflux disease without esophagitis: Secondary | ICD-10-CM | POA: Diagnosis not present

## 2022-05-24 DIAGNOSIS — R739 Hyperglycemia, unspecified: Secondary | ICD-10-CM | POA: Diagnosis not present

## 2022-05-24 DIAGNOSIS — E782 Mixed hyperlipidemia: Secondary | ICD-10-CM | POA: Diagnosis not present

## 2022-06-12 NOTE — Progress Notes (Deleted)
    SUBJECTIVE:   CHIEF COMPLAINT / HPI:   Nancy Young is a 36 y.o. female who presents to the [facility] clinic today to discuss the following concerns:   Prediabetes Last A1c 5.8 in December 2021. Currently on Ozempic *** mg/week.   Medication Refills Requests refills for the following medications: ***     PERTINENT  PMH / PSH: GERD, obesity, prediabetes, b/l knee pain   OBJECTIVE:   There were no vitals taken for this visit.   General: NAD, pleasant, able to participate in exam Cardiac: RRR, no murmurs. Respiratory: CTAB, normal effort, No wheezes, rales or rhonchi Abdomen: Bowel sounds present, nontender, nondistended, no hepatosplenomegaly. Extremities: no edema or cyanosis. Skin: warm and dry, no rashes noted Neuro: alert, no obvious focal deficits Psych: Normal affect and mood  ASSESSMENT/PLAN:   No problem-specific Assessment & Plan notes found for this encounter.     Sabino Dick, DO [facility]

## 2022-06-13 ENCOUNTER — Ambulatory Visit: Payer: Medicaid Other | Admitting: Family Medicine

## 2022-08-21 ENCOUNTER — Ambulatory Visit (INDEPENDENT_AMBULATORY_CARE_PROVIDER_SITE_OTHER): Payer: Medicaid Other

## 2022-08-21 ENCOUNTER — Ambulatory Visit (INDEPENDENT_AMBULATORY_CARE_PROVIDER_SITE_OTHER): Payer: Medicaid Other | Admitting: Family Medicine

## 2022-08-21 ENCOUNTER — Other Ambulatory Visit: Payer: Self-pay

## 2022-08-21 ENCOUNTER — Encounter (HOSPITAL_COMMUNITY): Payer: Self-pay | Admitting: Emergency Medicine

## 2022-08-21 ENCOUNTER — Ambulatory Visit (HOSPITAL_COMMUNITY)
Admission: EM | Admit: 2022-08-21 | Discharge: 2022-08-21 | Disposition: A | Discharge status: Home / Self Care | Payer: Medicaid Other | Attending: Physician Assistant | Admitting: Physician Assistant

## 2022-08-21 ENCOUNTER — Encounter: Payer: Self-pay | Admitting: Orthopaedic Surgery

## 2022-08-21 VITALS — BP 123/61 | HR 77 | Ht 62.0 in | Wt 224.4 lb

## 2022-08-21 DIAGNOSIS — R7303 Prediabetes: Secondary | ICD-10-CM | POA: Diagnosis not present

## 2022-08-21 DIAGNOSIS — M25511 Pain in right shoulder: Secondary | ICD-10-CM

## 2022-08-21 DIAGNOSIS — R1310 Dysphagia, unspecified: Secondary | ICD-10-CM | POA: Diagnosis not present

## 2022-08-21 DIAGNOSIS — K219 Gastro-esophageal reflux disease without esophagitis: Secondary | ICD-10-CM | POA: Diagnosis present

## 2022-08-21 DIAGNOSIS — M791 Myalgia, unspecified site: Secondary | ICD-10-CM

## 2022-08-21 DIAGNOSIS — S29012A Strain of muscle and tendon of back wall of thorax, initial encounter: Secondary | ICD-10-CM

## 2022-08-21 HISTORY — DX: Gastro-esophageal reflux disease without esophagitis: K21.9

## 2022-08-21 LAB — POCT GLYCOSYLATED HEMOGLOBIN (HGB A1C): Hemoglobin A1C: 5.9 % — AB (ref 4.0–5.6)

## 2022-08-21 MED ORDER — OMEPRAZOLE 20 MG PO CPDR: 20.0000 mg | DELAYED_RELEASE_CAPSULE | Freq: Every day | ORAL | 0 refills | Status: DC

## 2022-08-21 MED ORDER — IBUPROFEN 800 MG PO TABS: 800.0000 mg | ORAL_TABLET | Freq: Three times a day (TID) | ORAL | 0 refills | Status: AC

## 2022-08-21 MED ORDER — METHOCARBAMOL 500 MG PO TABS: 500.0000 mg | ORAL_TABLET | Freq: Two times a day (BID) | ORAL | 0 refills | Status: AC

## 2022-08-21 NOTE — Telephone Encounter (Signed)
Sure that's fine.  How much time is she asking for?

## 2022-08-21 NOTE — Telephone Encounter (Signed)
Sounds good to me

## 2022-08-21 NOTE — Progress Notes (Signed)
    SUBJECTIVE:   CHIEF COMPLAINT / HPI:  Chief Complaint  Patient presents with   Heartburn   Dysmenorrhea    Patient here today to discuss multiple concerns.  Here with wife today.  Patient reports that she has had worsening acid reflux/heartburn ongoing for the past several weeks.  She does have a history of GERD and previously was taking omeprazole and famotidine however she ran out of the medications several months ago.  She notices most of her symptoms at night and symptoms are bad enough that she is vomiting twice a week.  She has also noticed some dysphagia to solid foods only intermittently for the past 6 months.  She reports that she has always had painful menses and is wondering what can be done about this.  She does not take any over-the-counter medications for this.  Sometimes periods are heavy and sometimes they are light.  She is requesting for A1c to be checked today.  PERTINENT  PMH / PSH: GERD, prediabetes  Patient Care Team: Sabino Dick, DO as PCP - General (Family Medicine)   OBJECTIVE:   BP 123/61   Pulse 77   Ht 5\' 2"  (1.575 m)   Wt 224 lb 6.4 oz (101.8 kg)   LMP 07/13/2022   SpO2 100%   BMI 41.04 kg/m   Physical Exam Constitutional:      General: She is not in acute distress.    Appearance: She is obese.  Cardiovascular:     Rate and Rhythm: Normal rate and regular rhythm.  Pulmonary:     Effort: Pulmonary effort is normal. No respiratory distress.     Breath sounds: Normal breath sounds.  Abdominal:     Palpations: Abdomen is soft.     Tenderness: There is no abdominal tenderness.  Musculoskeletal:     Cervical back: Neck supple.  Neurological:     Mental Status: She is alert.         08/21/2022    4:19 PM  Depression screen PHQ 2/9  Decreased Interest 0  Down, Depressed, Hopeless 2  PHQ - 2 Score 2  Altered sleeping 2  Tired, decreased energy 1  Change in appetite 0  Feeling bad or failure about yourself  0  Trouble  concentrating 0  Moving slowly or fidgety/restless 0  Suicidal thoughts 0  PHQ-9 Score 5     Wt Readings from Last 3 Encounters:  08/21/22 224 lb 6.4 oz (101.8 kg)  11/22/21 223 lb 3.2 oz (101.2 kg)  09/29/21 219 lb (99.3 kg)        ASSESSMENT/PLAN:   Prediabetes Recheck A1c, remains in prediabetic range.  GERD (gastroesophageal reflux disease) Uncontrolled likely related to medication nonadherence and possible dietary triggers.  With new development of dysphagia, I think she needs GI evaluation and possible EGD. - restart omeprazole once daily for at least 8 weeks, then consider as needed dosing - GI referral   Menstrual cramping Patient with history of significant menstrual cramping.  Discussed contraception options and she is interested in hormonal IUD. - advised to try Tylenol and/or ibuprofen as needed no NSAIDs may worsen GERD so can try cautiously - scheduled with myself 11/21 for IUD placement, contraception information provided so that she may consider other options if desired  Return in about 2 months (around 10/21/2022) for f/u GERD.   10/23/2022, MD [facility]
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
## 2022-08-21 NOTE — Assessment & Plan Note (Signed)
Uncontrolled likely related to medication nonadherence and possible dietary triggers.  With new development of dysphagia, I think she needs GI evaluation and possible EGD. - restart omeprazole once daily for at least 8 weeks, then consider as needed dosing - GI referral

## 2022-08-21 NOTE — Patient Instructions (Addendum)
It was nice seeing you today!  Take omeprazole once a day. Try to avoid foods that trigger acid reflux (see handout).  I am sending you a referral to GI. They should call you for the appointment.  Check out bedsider.org for more information on birth control.  Stay well, Littie Deeds, MD [facility] [phone]  --  Make sure to check out at the front desk before you leave today.  Please arrive at least 15 minutes prior to your scheduled appointments.  If you had blood work today, I will send you a MyChart message or a letter if results are normal. Otherwise, I will give you a call.  If you had a referral placed, they will call you to set up an appointment. Please give [place] a call if you don't hear back in the next 2 weeks.  If you need additional refills before your next appointment, please call your pharmacy first.
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
## 2022-08-21 NOTE — Assessment & Plan Note (Signed)
Recheck A1c, remains in prediabetic range.

## 2022-08-23 DIAGNOSIS — R739 Hyperglycemia, unspecified: Secondary | ICD-10-CM | POA: Diagnosis not present

## 2022-08-23 DIAGNOSIS — Z6836 Body mass index (BMI) 36.0-36.9, adult: Secondary | ICD-10-CM | POA: Diagnosis not present

## 2022-08-28 NOTE — Progress Notes (Unsigned)
    SUBJECTIVE:   CHIEF COMPLAINT / HPI:  No chief complaint on file.   Patient desires hormonal IUD for symptom management of painful menses.  PERTINENT  PMH / PSH: GERD, prediabetes  Patient Care Team: Sabino Dick, DO as PCP - General (Family Medicine)   OBJECTIVE:   LMP 07/13/2022   Physical Exam      08/21/2022    4:19 PM  Depression screen PHQ 2/9  Decreased Interest 0  Down, Depressed, Hopeless 2  PHQ - 2 Score 2  Altered sleeping 2  Tired, decreased energy 1  Change in appetite 0  Feeling bad or failure about yourself  0  Trouble concentrating 0  Moving slowly or fidgety/restless 0  Suicidal thoughts 0  PHQ-9 Score 5     {Show previous vital signs (optional):23777}  {Labs  Heme  Chem  Endocrine  Serology  Results Review (optional):23779}  ASSESSMENT/PLAN:   No problem-specific Assessment & Plan notes found for this encounter.    No follow-ups on file.   Nancy Deeds, MD [facility]

## 2022-08-28 NOTE — Patient Instructions (Incomplete)
It was nice seeing you today!  Take Tylenol and/or ibuprofen as needed for pain and discomfort.  Let us know if you develop fevers, chills, unusual vaginal discharge, or unbearable pain.  Come back in about 2 months to make sure you are doing OK with the IUD.  Stay well, Littie Deeds, MD [facility] [phone]  --  Make sure to check out at the front desk before you leave today.  Please arrive at least 15 minutes prior to your scheduled appointments.  If you had blood work today, I will send you a MyChart message or a letter if results are normal. Otherwise, I will give you a call.  If you had a referral placed, they will call you to set up an appointment. Please give [place] a call if you don't hear back in the next 2 weeks.  If you need additional refills before your next appointment, please call your pharmacy first.

## 2022-08-29 ENCOUNTER — Ambulatory Visit (INDEPENDENT_AMBULATORY_CARE_PROVIDER_SITE_OTHER): Payer: Medicaid Other | Admitting: Family Medicine

## 2022-08-29 ENCOUNTER — Encounter: Payer: Self-pay | Admitting: Family Medicine

## 2022-08-29 VITALS — BP 130/75 | HR 86 | Wt 224.0 lb

## 2022-08-29 DIAGNOSIS — Z30430 Encounter for insertion of intrauterine contraceptive device: Secondary | ICD-10-CM | POA: Diagnosis present

## 2022-08-29 DIAGNOSIS — Z975 Presence of (intrauterine) contraceptive device: Secondary | ICD-10-CM

## 2022-08-29 DIAGNOSIS — Z3009 Encounter for other general counseling and advice on contraception: Secondary | ICD-10-CM

## 2022-08-29 LAB — POCT URINE PREGNANCY: Preg Test, Ur: NEGATIVE

## 2022-08-29 NOTE — Assessment & Plan Note (Signed)
Uncomplicated placement of Mirena IUD, see procedure note above.

## 2022-08-30 MED ORDER — LEVONORGESTREL 20 MCG/DAY IU IUD
1.0000 | INTRAUTERINE_SYSTEM | Freq: Once | INTRAUTERINE | Status: AC
Start: 2022-08-30 — End: 2022-08-29
  Administered 2022-08-29: 1 via INTRAUTERINE

## 2022-08-30 NOTE — Addendum Note (Signed)
Addended by: Veronda Prude on: 08/30/2022 12:31 PM   Modules accepted: Orders

## 2022-09-03 ENCOUNTER — Telehealth: Payer: Medicaid Other | Admitting: Nurse Practitioner

## 2022-09-03 DIAGNOSIS — B3731 Acute candidiasis of vulva and vagina: Secondary | ICD-10-CM

## 2022-09-03 MED ORDER — TERCONAZOLE 0.8 % VA CREA: 1.0000 | TOPICAL_CREAM | Freq: Every day | VAGINAL | 0 refills | Status: AC

## 2022-09-03 MED ORDER — FLUCONAZOLE 150 MG PO TABS
150.0000 mg | ORAL_TABLET | Freq: Once | ORAL | 0 refills | Status: AC
Start: 2022-09-03 — End: 2022-09-03

## 2022-09-03 NOTE — Progress Notes (Signed)

## 2022-09-03 NOTE — Progress Notes (Signed)
I have spent 5 minutes in review of e-visit questionnaire, review and updating patient chart, medical decision making and response to patient.  ° °Ayce Pietrzyk W Silena Wyss, NP ° °  °

## 2022-09-03 NOTE — Addendum Note (Signed)
Addended by: Bertram Denver on: 09/03/2022 10:04 AM   Modules accepted: Orders

## 2022-09-08 ENCOUNTER — Telehealth: Payer: Self-pay | Admitting: Orthopaedic Surgery

## 2022-09-08 NOTE — Telephone Encounter (Signed)
Patient came in to drop off FMLA paperwork and $25 in cash patent filled out Auth forms will call in to provide Fax number

## 2022-09-12 DIAGNOSIS — H5213 Myopia, bilateral: Secondary | ICD-10-CM | POA: Diagnosis not present

## 2022-10-06 ENCOUNTER — Telehealth: Payer: Medicaid Other | Admitting: Physician Assistant

## 2022-10-06 DIAGNOSIS — L739 Follicular disorder, unspecified: Secondary | ICD-10-CM | POA: Diagnosis not present

## 2022-10-06 MED ORDER — CEPHALEXIN 500 MG PO CAPS: 500.0000 mg | ORAL_CAPSULE | Freq: Three times a day (TID) | ORAL | 0 refills | Status: AC

## 2022-10-06 NOTE — Progress Notes (Signed)
E Visit for Rash  We are sorry that you are not feeling well. Here is how we plan to help!  Based upon what you have shared with me it looks like you have a bacterial follicultits.  Folliculitis is inflammation of the hair follicles that can be caused by a superficial infection of the skin and is treated with an antibiotic. I have prescribed: and Keflex 500 mg three times per day for 7 days  HOME CARE:  Take cool showers and avoid direct sunlight. Apply cool compress or wet dressings. Take a bath in an oatmeal bath.  Sprinkle content of one Aveeno packet under running faucet with comfortably warm water.  Bathe for 15-20 minutes, 1-2 times daily.  Pat dry with a towel. Do not rub the rash. Use hydrocortisone cream. Take an antihistamine like Benadryl for widespread rashes that itch.  The adult dose of Benadryl is 25-50 mg by mouth 4 times daily. Caution:  This type of medication may cause sleepiness.  Do not drink alcohol, drive, or operate dangerous machinery while taking antihistamines.  Do not take these medications if you have prostate enlargement.  Read package instructions thoroughly on all medications that you take.  GET HELP RIGHT AWAY IF:  Symptoms don't go away after treatment. Severe itching that persists. If you rash spreads or swells. If you rash begins to smell. If it blisters and opens or develops a yellow-brown crust. You develop a fever. You have a sore throat. You become short of breath.  MAKE SURE YOU:  Understand these instructions. Will watch your condition. Will get help right away if you are not doing well or get worse.  Thank you for choosing an e-visit.  Your e-visit answers were reviewed by a board certified advanced clinical practitioner to complete your personal care plan. Depending upon the condition, your plan could have included both over the counter or prescription medications.  Please review your pharmacy choice. Make sure the pharmacy is open so  you can pick up prescription now. If there is a problem, you may contact your provider through MyChart messaging and have the prescription routed to another pharmacy.  Your safety is important to us. If you have drug allergies check your prescription carefully.   For the next 24 hours you can use MyChart to ask questions about today's visit, request a non-urgent call back, or ask for a work or school excuse. You will get an email in the next two days asking about your experience. I hope that your e-visit has been valuable and will speed your recovery.  I have spent 5 minutes in review of e-visit questionnaire, review and updating patient chart, medical decision making and response to patient.   Chester Sibert M Sherrise Liberto, PA-C  

## 2022-10-12 DIAGNOSIS — H5213 Myopia, bilateral: Secondary | ICD-10-CM | POA: Diagnosis not present

## 2022-10-23 ENCOUNTER — Telehealth: Payer: Medicaid Other | Admitting: Physician Assistant

## 2022-10-23 ENCOUNTER — Encounter: Payer: Self-pay | Admitting: Student

## 2022-10-23 ENCOUNTER — Ambulatory Visit (INDEPENDENT_AMBULATORY_CARE_PROVIDER_SITE_OTHER): Payer: Medicaid Other | Admitting: Student

## 2022-10-23 VITALS — BP 118/70 | HR 89 | Ht 63.0 in | Wt 226.2 lb

## 2022-10-23 DIAGNOSIS — Z975 Presence of (intrauterine) contraceptive device: Secondary | ICD-10-CM | POA: Diagnosis not present

## 2022-10-23 DIAGNOSIS — Z6836 Body mass index (BMI) 36.0-36.9, adult: Secondary | ICD-10-CM

## 2022-10-23 DIAGNOSIS — L739 Follicular disorder, unspecified: Secondary | ICD-10-CM | POA: Diagnosis not present

## 2022-10-23 DIAGNOSIS — F419 Anxiety disorder, unspecified: Secondary | ICD-10-CM

## 2022-10-23 DIAGNOSIS — E669 Obesity, unspecified: Secondary | ICD-10-CM | POA: Diagnosis not present

## 2022-10-23 DIAGNOSIS — Z79899 Other long term (current) drug therapy: Secondary | ICD-10-CM

## 2022-10-23 MED ORDER — SERTRALINE HCL 25 MG PO TABS: 25.0000 mg | ORAL_TABLET | Freq: Every day | ORAL | 0 refills | Status: DC

## 2022-10-23 MED ORDER — MOUNJARO 2.5 MG/0.5ML ~~LOC~~ SOAJ: 2.5000 mg | SUBCUTANEOUS | 0 refills | Status: DC

## 2022-10-23 MED ORDER — CLINDAMYCIN PHOSPHATE 1 % EX LOTN: TOPICAL_LOTION | Freq: Two times a day (BID) | CUTANEOUS | 0 refills | Status: DC

## 2022-10-23 NOTE — Progress Notes (Signed)
Virtual Visit Consent   [name], you are scheduled for a virtual visit with a [facility] provider today. Just as with appointments in the office, your consent must be obtained to participate. Your consent will be active for this visit and any virtual visit you may have with one of our providers in the next 365 days. If you have a MyChart account, a copy of this consent can be sent to you electronically.  As this is a virtual visit, video technology does not allow for your provider to perform a traditional examination. This may limit your provider's ability to fully assess your condition. If your provider identifies any concerns that need to be evaluated in person or the need to arrange testing (such as labs, EKG, etc.), we will make arrangements to do so. Although advances in technology are sophisticated, we cannot ensure that it will always work on either your end or our end. If the connection with a video visit is poor, the visit may have to be switched to a telephone visit. With either a video or telephone visit, we are not always able to ensure that we have a secure connection.  By engaging in this virtual visit, you consent to the provision of healthcare and authorize for your insurance to be billed (if applicable) for the services provided during this visit. Depending on your insurance coverage, you may receive a charge related to this service.  I need to obtain your verbal consent now. Are you willing to proceed with your visit today? Nancy Young has provided verbal consent on 10/23/2022 for a virtual visit (video or telephone). Leeanne Rio, [place]  Date: 10/23/2022 6:18 PM  Virtual Visit via Video Note   I, Leeanne Rio, connected with  Nancy Young  ([id number], [birth year]) on 10/23/22 at  6:00 PM EST by a video-enabled telemedicine application and verified that I am speaking with the correct person using two identifiers.  Location: Patient:  Virtual Visit Location Patient: Home Provider: Virtual Visit Location Provider: Home Office   I discussed the limitations of evaluation and management by telemedicine and the availability of in person appointments. The patient expressed understanding and agreed to proceed.    History of Present Illness: Nancy Young is a 37 y.o. who identifies as a female who was assigned female at birth, and is being seen today for question about medication prescribed earlier today by PCP office. Was unable to send a message via MyChart and the office is now closed. Was seen today for follow-up visit and diagnosed with mild flare of folliculitis. Was given Rx for Cleocin lotion to apply topically for 7 days. Patient notes cost of Rx was 80.00 so she did not get.  HPI: HPI  Problems:  Patient Active Problem List   Diagnosis Date Noted   IUD (intrauterine device) in place 08/29/2022   Amenorrhea 03/25/2021   Acute lateral meniscus tear of left knee 10/29/2020   Perimeniscal cyst of left knee 10/29/2020   Prediabetes 10/07/2020   GERD (gastroesophageal reflux disease) 08/27/2020   Abdominal pain in female 05/17/2020   Left knee pain 12/02/2019   Bilateral knee pain 09/23/2019   Strain of left foot 07/17/2019   Urinary frequency 06/18/2019   Sudden death of family member March 21, 2019   Carpal tunnel syndrome 12/31/2018   Vaginal discharge 05/14/2018   Health care maintenance 07/02/2017   Obesity 05/22/2017   STD exposure 05/22/2017    Allergies: No Known Allergies Medications:  Current Outpatient Medications:  clindamycin (CLEOCIN T) 1 % lotion, Apply topically 2 (two) times daily for 10 days., Disp: 60 mL, Rfl: 0   ibuprofen (ADVIL) 800 MG tablet, Take 1 tablet (800 mg total) by mouth 3 (three) times daily. (Patient not taking: Reported on 10/23/2022), Disp: 21 tablet, Rfl: 0   methocarbamol (ROBAXIN) 500 MG tablet, Take 1 tablet (500 mg total) by mouth 2 (two) times daily. (Patient not  taking: Reported on 10/23/2022), Disp: 20 tablet, Rfl: 0   omeprazole (PRILOSEC) 20 MG capsule, Take 1 capsule (20 mg total) by mouth daily., Disp: 90 capsule, Rfl: 0   sertraline (ZOLOFT) 25 MG tablet, Take 1 tablet (25 mg total) by mouth daily., Disp: 60 tablet, Rfl: 0   terconazole (TERAZOL 3) 0.8 % vaginal cream, Place 1 applicator vaginally at bedtime. (Patient not taking: Reported on 10/23/2022), Disp: 20 g, Rfl: 0   tirzepatide (MOUNJARO) 2.5 MG/0.5ML Pen, Inject 2.5 mg into the skin once a week., Disp: 2 mL, Rfl: 0  Observations/Objective: Patient is well-developed, well-nourished in no acute distress.  Resting comfortably at home.  Head is normocephalic, atraumatic.  No labored breathing. Speech is clear and coherent with logical content.  Patient is alert and oriented at baseline.   Assessment and Plan: 1. Medication management  Called pharmacy just to help patient see what the issue is. The Lotion clindamycin is not a preferred medication so a PA has been initiated by PCP office and pending per pharmacy. Let patient know to follow-up with PCP office in the morning/by lunch to see if there is an update. If not approved her PCP will change medication to a covered alternative (topical or oral). Also walked patient through how to send a message to her PCP office via [place]. No charge for visit.   Follow Up Instructions: I discussed the assessment and treatment plan with the patient. The patient was provided an opportunity to ask questions and all were answered. The patient agreed with the plan and demonstrated an understanding of the instructions.  A copy of instructions were sent to the patient via MyChart unless otherwise noted below.   The patient was advised to call back or seek an in-person evaluation if the symptoms worsen or if the condition fails to improve as anticipated.  Time:  I spent 10 minutes with the patient via telehealth technology discussing the above  problems/concerns.    Leeanne Rio, PA-C

## 2022-10-23 NOTE — Patient Instructions (Addendum)
It was great to see you! Thank you for allowing me to participate in your care!   Our plans for today:  - I prescribed you an antibacterial lotion. Apply to the affected area twice daily for 10 days. - Return if area worsens or more lesions develop -I sent in a prescription for Zoloft (for anxiety). Take once daily -I sent in [facility] for weight loss. Inject once weekly. If tolerating well, we can increase does in 4 weeks    Take care and seek immediate care sooner if you develop any concerns.   Dr. Precious Gilding, DO [facility]   Therapy and Counseling Resources Most providers on this list will take Medicaid. Patients with commercial insurance or Medicare should contact their insurance company to get a list of in network providers.  [facility] (spanish speaking therapist available)(habla espanol)(take medicare and medicaid)  [place], [place], [place] [address], [place] [email or web address] [phone]  BestDay:Psychiatry and Counseling [address] [place], [place] [address] [place], [place], [place] [address]      [phone]  [place] (spanish available) [place],  [address] [place] (take [facility] and medicare) [address] [place],  [address]       [phone]     [place] (virtual only) [phone]  Jinny Blossom [facility] [address], [place], [place]  Family Solutions:  [place]. [place] [phone]  Journeys Counseling:  [place] [address] Rosie Fate [phone]  [facility] (under & uninsured) [address], [place] [phone]    [email or web address]    [place] [address]  [place]    [phone]  [facility]  [place]     Phone:  [phone]     [place]  [place] [address]      [place], [place] ext [place]: [place], [place], [place]   [place] ([place] therapist) [email or web address]  [place] [address]   [place] [address]    [phone]    [facility]   [address],  [place], [place]   [place]  [place]  [facility]  [address] [place], [place]        [phone]  Open Access/Walk In Clinic under & uninsured  [facility]  [address] [place], [place] Crisis [phone]  Family Service of the [place],  ([place])   [place], [place]: [phone]) 8:30 - 12; 1 - 2:30  Family Service of the [place],  [place], [place]    ([phone]):8:30 - 12; 2 - 3PM  [facility],  [address],  [place]; [phone]):   Mon - Fri 8 AM - 5 PM  [facility] [place]  MWF 12:30 to 3:00 or call to schedule an appointment  [phone]  Specific Provider options Psychology Today  [email or web address] click on find a therapist  enter your zip code left side and select or  tailor a therapist for your specific need.   [facility] Provider Directory [email or web address]  (Medicaid)   Follow all drop down to find a provider  [place] or [email or web address] [address], [place], [place] Recovery support and educational   24- Hour Availability:   [facility]  [address] [place], [place] Crisis [phone]  Family  Service of the [employer] [phone]  [place]  [phone]   [place]  [phone] (after hours)  Therapeutic Alternative/Mobile Crisis   [phone]  [place] National Suicide Hotline  8605114994 Diamantina Monks)  Call 911 or go to emergency room  [facility]  [phone]);  [place] and [employer]  [phone]); [place], [place], [place], [place], [place], [place], [place]    Pt states anxiety has worsened over the past few months. Anxilety worsens when she is late for work, if schedule gets thrown off in any way, if she is not in control of what's happening she feels chest tightness, has to take deep breaths, sometimes cries. No SI or thoughts of self harm, states she doesn't feel depressed. Would like medication to help control anxiety.

## 2022-10-23 NOTE — Progress Notes (Signed)
    SUBJECTIVE:   CHIEF COMPLAINT / HPI:   IUD string check Mirena IUD placed in November 2023.  Wants to make sure it's still in place.  Anxiety Pt states anxiety has worsened over the past few months.  Wonders if it could be related to having IUD placed.  Worsens when she is late for work, if schedule gets thrown off in any way, if she is not in control of what's happening. She feels chest tightness, has to take deep breaths, sometimes cries. No SI or thoughts of self harm, states she doesn't feel depressed.  Went to therapy in the past, thinks it helped a little.  Is interested in therapy resources today.  Would like medication to help control anxiety.   Obesity Patient previously took Apollo for weight loss however was taken off due to change in insurance.  She is interested in starting back a weight loss medication.  Lesion on labia Patient describes 2 lesions on labia states 1 drained fluid yesterday and feels better.  Has never had anything like this before.  PERTINENT  PMH / PSH: Obesity   OBJECTIVE:   Vitals:   10/23/22 1509  BP: 118/70  Pulse: 89  SpO2: 100%   General: NAD, pleasant, able to participate in exam Cardiac: Well-perfused Respiratory: Breathing comfortably on room air GU:  One nodular lesion on the right labia majora approximately 1 cm x 1 cm, does not feel fluctuant, has not come to ahead.  No erythema, no drainage.  Otherwise normal external female genitalia with no warts, ulcers.  Pink moist vaginal mucosa with normal-appearing cervix without masses or lesions and 2 IUD strings coming from cervical os. Skin: warm and dry Neuro: alert, no obvious focal deficits Psych: Normal affect and mood  ASSESSMENT/PLAN:   IUD (intrauterine device) in place IUD strings seen exiting cervical os  Anxiety -Therapy resources provided -Start Zoloft 25 mg daily -Return in 1 month for follow-up visit  Obesity -Start Mounjaro 2.5 mg injection weekly, if tolerating  can increase to 5 mg after 4 weeks  Folliculitis Lesion on labia appears to be folliculitis.  No evidence of warts or ulcers concerning for STDs.  Given patient's obesity and nodular lesion, can consider hidradenitis suppurativa although this is the first time patient has experienced this.  If these lesions continue, can consider diagnosis of HS. -Clindamycin solution 2 times daily for 10 days     Dr. Precious Gilding, [place]

## 2022-10-24 ENCOUNTER — Telehealth: Payer: Self-pay

## 2022-10-24 ENCOUNTER — Telehealth: Payer: Medicaid Other | Admitting: Physician Assistant

## 2022-10-24 ENCOUNTER — Encounter: Payer: Self-pay | Admitting: Student

## 2022-10-24 DIAGNOSIS — N760 Acute vaginitis: Secondary | ICD-10-CM

## 2022-10-24 DIAGNOSIS — B9689 Other specified bacterial agents as the cause of diseases classified elsewhere: Secondary | ICD-10-CM

## 2022-10-24 DIAGNOSIS — F419 Anxiety disorder, unspecified: Secondary | ICD-10-CM | POA: Insufficient documentation

## 2022-10-24 DIAGNOSIS — L739 Follicular disorder, unspecified: Secondary | ICD-10-CM | POA: Insufficient documentation

## 2022-10-24 MED ORDER — METRONIDAZOLE 0.75 % EX GEL: CUTANEOUS | 0 refills | Status: DC

## 2022-10-24 MED ORDER — FLUCONAZOLE 150 MG PO TABS: ORAL_TABLET | ORAL | 0 refills | Status: DC

## 2022-10-24 MED ORDER — CLINDAMYCIN PHOSPHATE 1 % EX SOLN: Freq: Two times a day (BID) | CUTANEOUS | 0 refills | Status: AC

## 2022-10-24 NOTE — Telephone Encounter (Signed)
This medication is not covered by patient's insurance. Please see the below formulary for insurance preferred medications.

## 2022-10-24 NOTE — Progress Notes (Signed)
E-Visit for Vaginal Symptoms  We are sorry that you are not feeling well. Here is how we plan to help! Based on what you shared with me it looks like you: May have a vaginosis due to bacteria  Vaginosis is an inflammation of the vagina that can result in discharge, itching and pain. The cause is usually a change in the normal balance of vaginal bacteria or an infection. Vaginosis can also result from reduced estrogen levels after menopause.  The most common causes of vaginosis are:   Bacterial vaginosis which results from an overgrowth of one on several organisms that are normally present in your vagina.   Yeast infections which are caused by a naturally occurring fungus called candida.   Vaginal atrophy (atrophic vaginosis) which results from the thinning of the vagina from reduced estrogen levels after menopause.   Trichomoniasis which is caused by a parasite and is commonly transmitted by sexual intercourse.  Factors that increase your risk of developing vaginosis include: Medications, such as antibiotics and steroids Uncontrolled diabetes Use of hygiene products such as bubble bath, vaginal spray or vaginal deodorant Douching Wearing damp or tight-fitting clothing Using an intrauterine device (IUD) for birth control Hormonal changes, such as those associated with pregnancy, birth control pills or menopause Sexual activity Having a sexually transmitted infection  Your treatment plan is Metrogel vaginal cream, applied as directed. I have sent in Diflucan just in case of a yeast infection from antibiotic use.  Be sure to take all of the medication as directed. Stop taking any medication if you develop a rash, tongue swelling or shortness of breath. Mothers who are breast feeding should consider pumping and discarding their breast milk while on these antibiotics. However, there is no consensus that infant exposure at these doses would be harmful.  Remember that medication creams can  weaken latex condoms. [place]   HOME CARE:  Good hygiene may prevent some types of vaginosis from recurring and may relieve some symptoms:  Avoid baths, hot tubs and whirlpool spas. Rinse soap from your outer genital area after a shower, and dry the area well to prevent irritation. Don't use scented or harsh soaps, such as those with deodorant or antibacterial action. Avoid irritants. These include scented tampons and pads. Wipe from front to back after using the toilet. Doing so avoids spreading fecal bacteria to your vagina.  Other things that may help prevent vaginosis include:  Don't douche. Your vagina doesn't require cleansing other than normal bathing. Repetitive douching disrupts the normal organisms that reside in the vagina and can actually increase your risk of vaginal infection. Douching won't clear up a vaginal infection. Use a latex condom. Both female and female latex condoms may help you avoid infections spread by sexual contact. Wear cotton underwear. Also wear pantyhose with a cotton crotch. If you feel comfortable without it, skip wearing underwear to bed. Yeast thrives in [employer] Your symptoms should improve in the next day or two.  GET HELP RIGHT AWAY IF:  You have pain in your lower abdomen ( pelvic area or over your ovaries) You develop nausea or vomiting You develop a fever Your discharge changes or worsens You have persistent pain with intercourse You develop shortness of breath, a rapid pulse, or you faint.  These symptoms could be signs of problems or infections that need to be evaluated by a medical provider now.  MAKE SURE YOU   Understand these instructions. Will watch your condition. Will get help right away if you  are not doing well or get worse.  Thank you for choosing an e-visit.  Your e-visit answers were reviewed by a board certified advanced clinical practitioner to complete your personal care plan. Depending upon the condition, your plan  could have included both over the counter or prescription medications.  Please review your pharmacy choice. Make sure the pharmacy is open so you can pick up prescription now. If there is a problem, you may contact your provider through [employer] and have the prescription routed to another pharmacy.  Your safety is important to [place]. If you have drug allergies check your prescription carefully.   For the next 24 hours you can use MyChart to ask questions about today's visit, request a non-urgent call back, or ask for a work or school excuse. You will get an email in the next two days asking about your experience. I hope that your e-visit has been valuable and will speed your recovery.

## 2022-10-24 NOTE — Assessment & Plan Note (Signed)
Lesion on labia appears to be folliculitis.  No evidence of warts or ulcers concerning for STDs.  Given patient's obesity and nodular lesion, can consider hidradenitis suppurativa although this is the first time patient has experienced this.  If these lesions continue, can consider diagnosis of HS. -Clindamycin solution 2 times daily for 10 days

## 2022-10-24 NOTE — Telephone Encounter (Signed)
A Prior Authorization was initiated for this patients MOUNJARO through CoverMyMeds.   Key: [id number]

## 2022-10-24 NOTE — Assessment & Plan Note (Signed)
-  Start Mounjaro 2.5 mg injection weekly, if tolerating can increase to 5 mg after 4 weeks

## 2022-10-24 NOTE — Assessment & Plan Note (Signed)
-  Therapy resources provided -Start Zoloft 25 mg daily -Return in 1 month for follow-up visit

## 2022-10-24 NOTE — Telephone Encounter (Signed)
Patient calls nurse line stating that Darcel Bayley requires prior authorization.   Forwarding to [place].   Talbot Grumbling, RN

## 2022-10-24 NOTE — Progress Notes (Signed)
I have spent 5 minutes in review of e-visit questionnaire, review and updating patient chart, medical decision making and response to patient.   Nancy Young Cody Fabianna Keats, PA-C    

## 2022-10-24 NOTE — Assessment & Plan Note (Signed)
IUD strings seen exiting cervical os

## 2022-10-27 NOTE — Telephone Encounter (Signed)
Prior Auth for patients medication MOUNJARO denied by [place] via CoverMyMeds.   Reason: The requested drug is being used for weight loss. Drugs used for this reason are not covered under your plan. Please speak with your doctor about your choices.  CoverMyMeds Key: [id number]

## 2022-10-31 ENCOUNTER — Other Ambulatory Visit: Payer: Self-pay | Admitting: Student

## 2022-10-31 DIAGNOSIS — E669 Obesity, unspecified: Secondary | ICD-10-CM

## 2022-10-31 DIAGNOSIS — R7303 Prediabetes: Secondary | ICD-10-CM

## 2022-10-31 DIAGNOSIS — E8881 Metabolic syndrome: Secondary | ICD-10-CM

## 2022-10-31 MED ORDER — MOUNJARO 2.5 MG/0.5ML ~~LOC~~ SOAJ: 2.5000 mg | SUBCUTANEOUS | 0 refills | Status: DC

## 2022-11-02 ENCOUNTER — Other Ambulatory Visit (HOSPITAL_COMMUNITY): Payer: Self-pay

## 2022-11-03 ENCOUNTER — Other Ambulatory Visit: Payer: Self-pay | Admitting: Student

## 2022-11-03 DIAGNOSIS — R7303 Prediabetes: Secondary | ICD-10-CM

## 2022-11-03 MED ORDER — SEMAGLUTIDE-WEIGHT MANAGEMENT 0.25 MG/0.5ML ~~LOC~~ SOAJ: 0.2500 mg | SUBCUTANEOUS | 1 refills | Status: AC

## 2022-11-03 NOTE — Progress Notes (Signed)
Pt could not get trulicity (not in stock). Will try wegovy.

## 2022-11-16 ENCOUNTER — Telehealth: Payer: Self-pay

## 2022-11-16 NOTE — Telephone Encounter (Signed)
A Prior Authorization was initiated for this patients WEGOVY through CoverMyMeds.   Key: [id number]

## 2022-11-16 NOTE — Telephone Encounter (Signed)
Prior Auth for patients medication WEGOVY denied by [place] via CoverMyMeds.   Reason: This  drug is not a covered benefit under your pharmacy benefits package.  CoverMyMeds Key: [id number]

## 2022-11-20 NOTE — Progress Notes (Unsigned)
    SUBJECTIVE:   CHIEF COMPLAINT / HPI:   Anxiety Patient started Zoloft 25 mg daily last month was provided with therapy resources.  Today states***  Obesity Previously took Ozempic for weight loss, wanted to start back but insurance will not cover.  PERTINENT  PMH / PSH: ***  OBJECTIVE:   There were no vitals taken for this visit. ***  General: NAD, pleasant, able to participate in exam Cardiac: RRR, no murmurs. Respiratory: CTAB, normal effort, No wheezes, rales or rhonchi Abdomen: Bowel sounds present, nontender, nondistended, no hepatosplenomegaly. Extremities: no edema or cyanosis. Skin: warm and dry, no rashes noted Neuro: alert, no obvious focal deficits Psych: Normal affect and mood  ASSESSMENT/PLAN:   No problem-specific Assessment & Plan notes found for this encounter.     Dr. Precious Gilding, [place]    {    This will disappear when note is signed, click to select method of visit    :1}

## 2022-11-21 ENCOUNTER — Other Ambulatory Visit: Payer: Self-pay | Admitting: Family Medicine

## 2022-11-21 ENCOUNTER — Ambulatory Visit (INDEPENDENT_AMBULATORY_CARE_PROVIDER_SITE_OTHER): Payer: Medicaid Other | Admitting: Student

## 2022-11-21 ENCOUNTER — Other Ambulatory Visit: Payer: Self-pay | Admitting: Nurse Practitioner

## 2022-11-21 ENCOUNTER — Encounter: Payer: Self-pay | Admitting: Student

## 2022-11-21 VITALS — BP 130/84 | HR 92 | Ht 63.0 in | Wt 228.4 lb

## 2022-11-21 DIAGNOSIS — F419 Anxiety disorder, unspecified: Secondary | ICD-10-CM | POA: Diagnosis not present

## 2022-11-21 DIAGNOSIS — B3731 Acute candidiasis of vulva and vagina: Secondary | ICD-10-CM

## 2022-11-21 MED ORDER — SERTRALINE HCL 50 MG PO TABS: 50.0000 mg | ORAL_TABLET | Freq: Every day | ORAL | 1 refills | Status: AC

## 2022-11-21 MED ORDER — OMEPRAZOLE 20 MG PO CPDR: 20.0000 mg | DELAYED_RELEASE_CAPSULE | Freq: Every day | ORAL | 0 refills | Status: DC

## 2022-11-21 NOTE — Assessment & Plan Note (Signed)
Anxiety and not sleeping well are likely related.  Will attempt to control anxiety which will likely improve sleep. -Zoloft increased to 50 mg daily, patient advised to trial taking it at nighttime instead of in the morning -Therapy resources provided -Patient advised to avoid naps during the day and to practice good sleep hygiene which was discussed -Return in 1 month for follow-up

## 2022-11-21 NOTE — Patient Instructions (Addendum)
It was great to see you! Thank you for allowing me to participate in your care!   Our plans for today:  - Increase Zoloft to 50 mg daily and switch to taking it at night time. Try not to nap during the day with goal of sleeping better at night.  -Practice good sleep hygiene: No TV, food, physcial activity 1-2 hours before bedtime. Find a soothing screen free activity to do before bed (color, music, talking to friend, journaling, reading, etc). -Return in one month for follow up  Take care and seek immediate care sooner if you develop any concerns.   Dr. Precious Gilding, DO [facility]   Therapy and Counseling Resources Most providers on this list will take Medicaid. Patients with commercial insurance or Medicare should contact their insurance company to get a list of in network providers.  [facility] (spanish speaking therapist available)(habla espanol)(take medicare and medicaid)  [place], [place], [place] [address], [place] [email or web address] [phone]  BestDay:Psychiatry and Counseling [address] [place], [place] [address] [place], [place], [place] [address]      [phone]  [place] (spanish available) [place], [place] [address] [place] (take [facility] and medicare) [address] [place],  [address]       [phone]     [place] (virtual only) [phone]  Jinny Blossom [facility] [address] [place], [place]  Family Solutions:  [place]. [place] [phone]  Journeys Counseling:  [place] [address] Fate [phone]  [facility] (under & uninsured) [address] [place] [phone]    [email or web address]    [place] [address]  [place]     [phone]  [facility] [place]     Phone:  [phone]     [place]  [place] [address]      [place], [place] ext [place]: [place], [place], [place]   [place] ([place] therapist) [email or web address]  [place] [address]   [place] [address]    [phone]    [facility]   [address],  [place], [place]   [place]  [place]  [facility]  [address] [place], [place]        [phone]  Open Access/Walk In Clinic under & uninsured  [employer]  [address] [place], [place] Crisis [phone]  Family Service of the [place],  ([place])   [place], [place]: [phone]) 8:30 - 12; 1 - 2:30  Family Service of the [place],  [place], [place]    ([phone]):8:30 - 12; 2 - 3PM  [facility],  [address],  [place]; [phone]):   Mon - Fri 8 AM - 5 PM  [facility] [place]  MWF 12:30 to 3:00 or call to schedule an appointment  [phone]  Specific Provider options Psychology Today  [email or web address] click on find a therapist  enter  your zip code left side and select or tailor a therapist for your specific need.   [facility] Provider Directory [email or web address]  (Medicaid)   Follow all drop down to find a provider  [place] or [email or web address] [address], [place], [place] Recovery support and educational   24- Hour Availability:   [facility]  [address] [place], [place] Crisis [phone]  Family Service of the [employer] [phone]  [place]  [phone]   [place]  [phone] (after hours)  Therapeutic Alternative/Mobile Crisis   [phone]  [place] National Suicide Hotline  3433586958 Diamantina Monks)  Call 911 or go to emergency room  [facility]  [phone]);  [place] and [employer]  [phone]); [place], [place], [place], [place], [place], [place], [place]

## 2022-11-28 ENCOUNTER — Ambulatory Visit: Payer: Medicaid Other | Admitting: Internal Medicine

## 2022-12-20 ENCOUNTER — Telehealth: Payer: Medicaid Other | Admitting: Physician Assistant

## 2022-12-20 DIAGNOSIS — H66002 Acute suppurative otitis media without spontaneous rupture of ear drum, left ear: Secondary | ICD-10-CM | POA: Diagnosis not present

## 2022-12-20 MED ORDER — NEOMYCIN-POLYMYXIN-HC 3.5-10000-1 OT SOLN: 3.0000 [drp] | Freq: Four times a day (QID) | OTIC | 0 refills | Status: AC

## 2022-12-20 MED ORDER — AMOXICILLIN-POT CLAVULANATE 875-125 MG PO TABS: 1.0000 | ORAL_TABLET | Freq: Two times a day (BID) | ORAL | 0 refills | Status: DC

## 2022-12-20 NOTE — Progress Notes (Signed)

## 2022-12-22 ENCOUNTER — Other Ambulatory Visit: Payer: Self-pay | Admitting: Student

## 2022-12-22 DIAGNOSIS — F419 Anxiety disorder, unspecified: Secondary | ICD-10-CM

## 2022-12-25 ENCOUNTER — Telehealth: Payer: Medicaid Other | Admitting: Physician Assistant

## 2022-12-25 ENCOUNTER — Other Ambulatory Visit: Payer: Self-pay | Admitting: Student

## 2022-12-25 DIAGNOSIS — B379 Candidiasis, unspecified: Secondary | ICD-10-CM

## 2022-12-25 DIAGNOSIS — F419 Anxiety disorder, unspecified: Secondary | ICD-10-CM

## 2022-12-25 MED ORDER — OMEPRAZOLE 20 MG PO CPDR: 20.0000 mg | DELAYED_RELEASE_CAPSULE | Freq: Every day | ORAL | 0 refills | Status: DC

## 2022-12-25 MED ORDER — FLUCONAZOLE 150 MG PO TABS: ORAL_TABLET | ORAL | 0 refills | Status: DC

## 2022-12-25 NOTE — Progress Notes (Signed)

## 2022-12-25 NOTE — Progress Notes (Signed)
I have spent 5 minutes in review of e-visit questionnaire, review and updating patient chart, medical decision making and response to patient.   [place] Piechota Cody Blaire Palomino, PA-C    

## 2022-12-29 ENCOUNTER — Encounter: Payer: Self-pay | Admitting: Student

## 2023-01-16 ENCOUNTER — Telehealth: Payer: Self-pay | Admitting: Family Medicine

## 2023-01-16 DIAGNOSIS — B9689 Other specified bacterial agents as the cause of diseases classified elsewhere: Secondary | ICD-10-CM

## 2023-01-16 DIAGNOSIS — N760 Acute vaginitis: Secondary | ICD-10-CM

## 2023-01-16 MED ORDER — METRONIDAZOLE 0.75 % EX GEL: CUTANEOUS | 0 refills | Status: AC

## 2023-01-16 NOTE — Progress Notes (Signed)
E-Visit for Vaginal Symptoms  We are sorry that you are not feeling well. Here is how we plan to help! Based on what you shared with me it looks like you: May have a vaginosis due to bacteria  Vaginosis is an inflammation of the vagina that can result in discharge, itching and pain. The cause is usually a change in the normal balance of vaginal bacteria or an infection. Vaginosis can also result from reduced estrogen levels after menopause.  The most common causes of vaginosis are:   Bacterial vaginosis which results from an overgrowth of one on several organisms that are normally present in your vagina.   Yeast infections which are caused by a naturally occurring fungus called candida.   Vaginal atrophy (atrophic vaginosis) which results from the thinning of the vagina from reduced estrogen levels after menopause.   Trichomoniasis which is caused by a parasite and is commonly transmitted by sexual intercourse.  Factors that increase your risk of developing vaginosis include: Medications, such as antibiotics and steroids Uncontrolled diabetes Use of hygiene products such as bubble bath, vaginal spray or vaginal deodorant Douching Wearing damp or tight-fitting clothing Using an intrauterine device (IUD) for birth control Hormonal changes, such as those associated with pregnancy, birth control pills or menopause Sexual activity Having a sexually transmitted infection  Your treatment plan is Metrogel use as directed- it might be worth looking into boric acid suppositories for BV   Be sure to take all of the medication as directed. Stop taking any medication if you develop a rash, tongue swelling or shortness of breath. Mothers who are breast feeding should consider pumping and discarding their breast milk while on these antibiotics. However, there is no consensus that infant exposure at these doses would be harmful.  Remember that medication creams can weaken latex condoms. [place]   HOME  CARE:  Good hygiene may prevent some types of vaginosis from recurring and may relieve some symptoms:  Avoid baths, hot tubs and whirlpool spas. Rinse soap from your outer genital area after a shower, and dry the area well to prevent irritation. Don't use scented or harsh soaps, such as those with deodorant or antibacterial action. Avoid irritants. These include scented tampons and pads. Wipe from front to back after using the toilet. Doing so avoids spreading fecal bacteria to your vagina.  Other things that may help prevent vaginosis include:  Don't douche. Your vagina doesn't require cleansing other than normal bathing. Repetitive douching disrupts the normal organisms that reside in the vagina and can actually increase your risk of vaginal infection. Douching won't clear up a vaginal infection. Use a latex condom. Both female and female latex condoms may help you avoid infections spread by sexual contact. Wear cotton underwear. Also wear pantyhose with a cotton crotch. If you feel comfortable without it, skip wearing underwear to bed. Yeast thrives in [employer] Your symptoms should improve in the next day or two.  GET HELP RIGHT AWAY IF:  You have pain in your lower abdomen ( pelvic area or over your ovaries) You develop nausea or vomiting You develop a fever Your discharge changes or worsens You have persistent pain with intercourse You develop shortness of breath, a rapid pulse, or you faint.  These symptoms could be signs of problems or infections that need to be evaluated by a medical provider now.  MAKE SURE YOU   Understand these instructions. Will watch your condition. Will get help right away if you are not doing well or  get worse.  Thank you for choosing an e-visit.  Your e-visit answers were reviewed by a board certified advanced clinical practitioner to complete your personal care plan. Depending upon the condition, your plan could have included both over the  counter or prescription medications.  Please review your pharmacy choice. Make sure the pharmacy is open so you can pick up prescription now. If there is a problem, you may contact your provider through [employer] and have the prescription routed to another pharmacy.  Your safety is important to [place]. If you have drug allergies check your prescription carefully.   For the next 24 hours you can use MyChart to ask questions about today's visit, request a non-urgent call back, or ask for a work or school excuse. You will get an email in the next two days asking about your experience. I hope that your e-visit has been valuable and will speed your recovery.  I provided 5 minutes of non face-to-face time during this encounter for chart review, medication and order placement, as well as and documentation.

## 2023-02-18 ENCOUNTER — Other Ambulatory Visit: Payer: Self-pay | Admitting: Student

## 2023-06-21 ENCOUNTER — Telehealth: Payer: Self-pay | Admitting: Physician Assistant

## 2023-06-21 DIAGNOSIS — T3695XA Adverse effect of unspecified systemic antibiotic, initial encounter: Secondary | ICD-10-CM

## 2023-06-21 DIAGNOSIS — B9689 Other specified bacterial agents as the cause of diseases classified elsewhere: Secondary | ICD-10-CM

## 2023-06-21 DIAGNOSIS — N760 Acute vaginitis: Secondary | ICD-10-CM

## 2023-06-21 DIAGNOSIS — B379 Candidiasis, unspecified: Secondary | ICD-10-CM

## 2023-06-21 MED ORDER — FLUCONAZOLE 150 MG PO TABS: 150.0000 mg | ORAL_TABLET | Freq: Once | ORAL | 0 refills | Status: DC

## 2023-06-21 MED ORDER — METRONIDAZOLE 0.75 % VA GEL: 1.0000 | Freq: Two times a day (BID) | VAGINAL | 0 refills | Status: DC

## 2023-06-21 NOTE — Progress Notes (Signed)
Virtual Visit Consent   [name], you are scheduled for a virtual visit with a [facility] provider today. Just as with appointments in the office, your consent must be obtained to participate. Your consent will be active for this visit and any virtual visit you may have with one of our providers in the next 365 days. If you have a MyChart account, a copy of this consent can be sent to you electronically.  As this is a virtual visit, video technology does not allow for your provider to perform a traditional examination. This may limit your provider's ability to fully assess your condition. If your provider identifies any concerns that need to be evaluated in person or the need to arrange testing (such as labs, EKG, etc.), we will make arrangements to do so. Although advances in technology are sophisticated, we cannot ensure that it will always work on either your end or our end. If the connection with a video visit is poor, the visit may have to be switched to a telephone visit. With either a video or telephone visit, we are not always able to ensure that we have a secure connection.  By engaging in this virtual visit, you consent to the provision of healthcare and authorize for your insurance to be billed (if applicable) for the services provided during this visit. Depending on your insurance coverage, you may receive a charge related to this service.  I need to obtain your verbal consent now. Are you willing to proceed with your visit today? [name] has provided verbal consent on 06/21/2023 for a virtual visit (video or telephone). [name], PA-C  Date: 06/21/2023 5:00 PM  Virtual Visit via Video Note   I, [name], connected with  Nancy Young  ([id number], Oct 27, 1985) on 06/21/23 at  5:00 PM EDT by a video-enabled telemedicine application and verified that I am speaking with the correct person using two identifiers.  Location: Patient:  Virtual Visit Location Patient: Mobile Provider: Virtual Visit Location Provider: Home Office   I discussed the limitations of evaluation and management by telemedicine and the availability of in person appointments. The patient expressed understanding and agreed to proceed.    History of Present Illness: Nancy Young is a 37 y.o. who identifies as a female who was assigned female at birth, and is being seen today for vaginal discharge.  HPI: Vaginal Discharge The patient's primary symptoms include a genital odor and vaginal discharge. This is a new problem. The current episode started in the past 7 days (last week around labor day). The problem occurs constantly. The problem has been gradually worsening. The patient is experiencing no pain. Pertinent negatives include no chills, dysuria, fever, flank pain, frequency or nausea. The vaginal discharge was malodorous, thin, white and yellow. There has been no bleeding. She has not been passing clots. She has not been passing tissue. Nothing aggravates the symptoms. She has tried nothing for the symptoms. The treatment provided no relief.     Problems:  Patient Active Problem List   Diagnosis Date Noted   Folliculitis 10/24/2022   Anxiety 10/24/2022   IUD (intrauterine device) in place 08/29/2022   Amenorrhea 03/25/2021   Acute lateral meniscus tear of left knee 10/29/2020   Perimeniscal cyst of left knee 10/29/2020   Prediabetes 10/07/2020   GERD (gastroesophageal reflux disease) 08/27/2020   Abdominal pain in female 05/17/2020   Left knee pain 12/02/2019   Bilateral knee pain 09/23/2019   Strain of left foot 07/17/2019  Urinary frequency 05/21/2019   Sudden death of family member Mar 15, 2019   Carpal tunnel syndrome 12/31/2018   Vaginal discharge 05/14/2018   Health care maintenance 07/02/2017   Obesity 05/22/2017   STD exposure 05/22/2017    Allergies: No Known Allergies Medications:  Current Outpatient Medications:     fluconazole (DIFLUCAN) 150 MG tablet, Take 1 tablet (150 mg total) by mouth once for 1 dose., Disp: 1 tablet, Rfl: 0   metroNIDAZOLE (METROGEL) 0.75 % vaginal gel, Place 1 Applicatorful vaginally 2 (two) times daily., Disp: 70 g, Rfl: 0   ibuprofen (ADVIL) 800 MG tablet, Take 1 tablet (800 mg total) by mouth 3 (three) times daily. (Patient not taking: Reported on 10/23/2022), Disp: 21 tablet, Rfl: 0   methocarbamol (ROBAXIN) 500 MG tablet, Take 1 tablet (500 mg total) by mouth 2 (two) times daily. (Patient not taking: Reported on 10/23/2022), Disp: 20 tablet, Rfl: 0   metroNIDAZOLE (METROGEL) 0.75 % gel, Insert one applicatorful (5g) of medicine into the vagina once nightly x 5 days, Disp: 25 g, Rfl: 0   neomycin-polymyxin-hydrocortisone (CORTISPORIN) OTIC solution, Place 3 drops into the left ear 4 (four) times daily. X 7 days, Disp: 10 mL, Rfl: 0   omeprazole (PRILOSEC) 20 MG capsule, TAKE 1 CAPSULE(20 MG) BY MOUTH DAILY, Disp: 90 capsule, Rfl: 0   Semaglutide-Weight Management 0.25 MG/0.5ML SOAJ, Inject 0.25 mg into the skin once a week. After 4 weeks, increase to 0.5 mg weekly., Disp: 2 mL, Rfl: 1   sertraline (ZOLOFT) 50 MG tablet, Take 1 tablet (50 mg total) by mouth daily., Disp: 30 tablet, Rfl: 1   sertraline (ZOLOFT) 50 MG tablet, Take 1 tablet (50 mg total) by mouth daily., Disp: 90 tablet, Rfl: 0   terconazole (TERAZOL 3) 0.8 % vaginal cream, Place 1 applicator vaginally at bedtime. (Patient not taking: Reported on 10/23/2022), Disp: 20 g, Rfl: 0  Observations/Objective: Patient is well-developed, well-nourished in no acute distress.  Resting comfortably  Head is normocephalic, atraumatic.  No labored breathing.  Speech is clear and coherent with logical content.  Patient is alert and oriented at baseline.    Assessment and Plan: 1. BV (bacterial vaginosis) - metroNIDAZOLE (METROGEL) 0.75 % vaginal gel; Place 1 Applicatorful vaginally 2 (two) times daily.  Dispense: 70 g; Refill:  0  2. Antibiotic-induced yeast infection - fluconazole (DIFLUCAN) 150 MG tablet; Take 1 tablet (150 mg total) by mouth once for 1 dose.  Dispense: 1 tablet; Refill: 0  - Symptoms consistent with BV - Metrogel prescribed - Diflucan given as prophylaxis as patient tends to get vaginal yeast infections with antibiotic use - Limit bubble baths, scented lotions/soaps/detergents - Limit tight fitting clothing - Seek on person evaluation if not improving or if symptoms worsen   Follow Up Instructions: I discussed the assessment and treatment plan with the patient. The patient was provided an opportunity to ask questions and all were answered. The patient agreed with the plan and demonstrated an understanding of the instructions.  A copy of instructions were sent to the patient via MyChart unless otherwise noted below.    The patient was advised to call back or seek an in-person evaluation if the symptoms worsen or if the condition fails to improve as anticipated.  Time:  I spent 8 minutes with the patient via telehealth technology discussing the above problems/concerns.    [name], PA-C

## 2023-06-21 NOTE — Patient Instructions (Signed)
Nancy Young, thank you for joining [name], PA-C for today's virtual visit.  While this provider is not your primary care provider (PCP), if your PCP is located in our provider database this encounter information will be shared with them immediately following your visit.   A [facility] MyChart account gives you access to today's visit and all your visits, tests, and labs performed at [facility] " click here if you don't have a [facility] MyChart account or go to [email or web address]  Consent: (Patient) Nancy Young provided verbal consent for this virtual visit at the beginning of the encounter.  Current Medications:  Current Outpatient Medications:    fluconazole (DIFLUCAN) 150 MG tablet, Take 1 tablet (150 mg total) by mouth once for 1 dose., Disp: 1 tablet, Rfl: 0   metroNIDAZOLE (METROGEL) 0.75 % vaginal gel, Place 1 Applicatorful vaginally 2 (two) times daily., Disp: 70 g, Rfl: 0   ibuprofen (ADVIL) 800 MG tablet, Take 1 tablet (800 mg total) by mouth 3 (three) times daily. (Patient not taking: Reported on 10/23/2022), Disp: 21 tablet, Rfl: 0   methocarbamol (ROBAXIN) 500 MG tablet, Take 1 tablet (500 mg total) by mouth 2 (two) times daily. (Patient not taking: Reported on 10/23/2022), Disp: 20 tablet, Rfl: 0   metroNIDAZOLE (METROGEL) 0.75 % gel, Insert one applicatorful (5g) of medicine into the vagina once nightly x 5 days, Disp: 25 g, Rfl: 0   neomycin-polymyxin-hydrocortisone (CORTISPORIN) OTIC solution, Place 3 drops into the left ear 4 (four) times daily. X 7 days, Disp: 10 mL, Rfl: 0   omeprazole (PRILOSEC) 20 MG capsule, TAKE 1 CAPSULE(20 MG) BY MOUTH DAILY, Disp: 90 capsule, Rfl: 0   Semaglutide-Weight Management 0.25 MG/0.5ML SOAJ, Inject 0.25 mg into the skin once a week. After 4 weeks, increase to 0.5 mg weekly., Disp: 2 mL, Rfl: 1   sertraline (ZOLOFT) 50 MG tablet, Take 1 tablet (50 mg total) by mouth daily., Disp: 30 tablet,  Rfl: 1   sertraline (ZOLOFT) 50 MG tablet, Take 1 tablet (50 mg total) by mouth daily., Disp: 90 tablet, Rfl: 0   terconazole (TERAZOL 3) 0.8 % vaginal cream, Place 1 applicator vaginally at bedtime. (Patient not taking: Reported on 10/23/2022), Disp: 20 g, Rfl: 0   Medications ordered in this encounter:  Meds ordered this encounter  Medications   metroNIDAZOLE (METROGEL) 0.75 % vaginal gel    Sig: Place 1 Applicatorful vaginally 2 (two) times daily.    Dispense:  70 g    Refill:  0    Order Specific Question:   Supervising Provider    Answer:   Merrilee Jansky [[id number]]   fluconazole (DIFLUCAN) 150 MG tablet    Sig: Take 1 tablet (150 mg total) by mouth once for 1 dose.    Dispense:  1 tablet    Refill:  0    Order Specific Question:   Supervising Provider    Answer:   Merrilee Jansky [id number]     *If you need refills on other medications prior to your next appointment, please contact your pharmacy*  Follow-Up: Call back or seek an in-person evaluation if the symptoms worsen or if the condition fails to improve as anticipated.  [facility] [phone]  Other Instructions Vaginal Probiotics: AZO vaginal probiotic OLLY Happy Hoo-Ha RAW Vaginal Care RenewLife Women's vaginal probiotic RepHresh Pro-B  Vaginal washes: Honey Pot Summer's Eve Vagisil Feminine cleanser    If you have been instructed to have an in-person evaluation  today at a local Urgent Care facility, please use the link below. It will take you to a list of all of our available [facility], including address, phone number and hours of operation. Please do not delay care.  [facility]  If you or a family member do not have a primary care provider, use the link below to schedule a visit and establish care. When you choose a [facility] primary care physician or advanced practice provider, you gain a long-term partner in health. Find a Primary Care Provider  Learn  more about [facility]'s in-office and virtual care options: [facility] - Get Care Now

## 2023-06-22 ENCOUNTER — Telehealth: Payer: Self-pay | Admitting: Physician Assistant

## 2023-06-22 DIAGNOSIS — Z79899 Other long term (current) drug therapy: Secondary | ICD-10-CM

## 2023-06-22 MED ORDER — METRONIDAZOLE 0.75 % VA GEL: 1.0000 | Freq: Two times a day (BID) | VAGINAL | 0 refills | Status: AC

## 2023-06-22 MED ORDER — FLUCONAZOLE 150 MG PO TABS
150.0000 mg | ORAL_TABLET | Freq: Once | ORAL | 0 refills | Status: AC
Start: 2023-06-22 — End: 2023-06-22

## 2023-06-22 NOTE — Addendum Note (Signed)
Addended by: [name] on: 06/22/2023 10:28 AM   Modules accepted: Orders

## 2023-06-22 NOTE — Progress Notes (Signed)
Patient just needed medications sent to another pharmacy from visit yesterday as the other pharmacy did not have them on file.
